# Patient Record
Sex: Male | Born: 1970 | Race: Black or African American | Hispanic: No | Marital: Single | Smoking: Current every day smoker
Health system: Southern US, Community
[De-identification: ages and names within clinical notes are randomized; demographics above are authoritative.]

## PROBLEM LIST (undated history)

## (undated) DIAGNOSIS — I471 Supraventricular tachycardia, unspecified: Secondary | ICD-10-CM

## (undated) DIAGNOSIS — F410 Panic disorder [episodic paroxysmal anxiety] without agoraphobia: Secondary | ICD-10-CM

## (undated) DIAGNOSIS — K5792 Diverticulitis of intestine, part unspecified, without perforation or abscess without bleeding: Secondary | ICD-10-CM

## (undated) DIAGNOSIS — M1990 Unspecified osteoarthritis, unspecified site: Secondary | ICD-10-CM

## (undated) DIAGNOSIS — I5022 Chronic systolic (congestive) heart failure: Secondary | ICD-10-CM

## (undated) DIAGNOSIS — K219 Gastro-esophageal reflux disease without esophagitis: Secondary | ICD-10-CM

## (undated) DIAGNOSIS — I499 Cardiac arrhythmia, unspecified: Secondary | ICD-10-CM

## (undated) DIAGNOSIS — R0600 Dyspnea, unspecified: Secondary | ICD-10-CM

## (undated) DIAGNOSIS — I10 Essential (primary) hypertension: Secondary | ICD-10-CM

## (undated) DIAGNOSIS — I2510 Atherosclerotic heart disease of native coronary artery without angina pectoris: Secondary | ICD-10-CM

## (undated) HISTORY — DX: Supraventricular tachycardia, unspecified: I47.10

## (undated) HISTORY — DX: Chronic systolic (congestive) heart failure: I50.22

## (undated) HISTORY — DX: Atherosclerotic heart disease of native coronary artery without angina pectoris: I25.10

## (undated) HISTORY — PX: NO PAST SURGERIES: SHX2092

## (undated) HISTORY — DX: Essential (primary) hypertension: I10

## (undated) HISTORY — DX: Diverticulitis of intestine, part unspecified, without perforation or abscess without bleeding: K57.92

## (undated) HISTORY — PX: COLONOSCOPY: SHX174

## (undated) HISTORY — DX: Supraventricular tachycardia: I47.1

---

## 2005-12-09 ENCOUNTER — Emergency Department (HOSPITAL_COMMUNITY): Admission: EM | Admit: 2005-12-09 | Discharge: 2005-12-09 | Payer: Self-pay | Admitting: Emergency Medicine

## 2013-01-08 ENCOUNTER — Encounter (HOSPITAL_COMMUNITY): Payer: Self-pay | Admitting: Emergency Medicine

## 2013-01-08 ENCOUNTER — Emergency Department (HOSPITAL_COMMUNITY)
Admission: EM | Admit: 2013-01-08 | Discharge: 2013-01-08 | Disposition: A | Discharge status: Home / Self Care | Payer: Self-pay | Attending: Emergency Medicine | Admitting: Emergency Medicine

## 2013-01-08 ENCOUNTER — Emergency Department (HOSPITAL_COMMUNITY): Payer: Self-pay

## 2013-01-08 DIAGNOSIS — Y9389 Activity, other specified: Secondary | ICD-10-CM | POA: Insufficient documentation

## 2013-01-08 DIAGNOSIS — F17200 Nicotine dependence, unspecified, uncomplicated: Secondary | ICD-10-CM | POA: Insufficient documentation

## 2013-01-08 DIAGNOSIS — Y929 Unspecified place or not applicable: Secondary | ICD-10-CM | POA: Insufficient documentation

## 2013-01-08 DIAGNOSIS — X500XXA Overexertion from strenuous movement or load, initial encounter: Secondary | ICD-10-CM | POA: Insufficient documentation

## 2013-01-08 DIAGNOSIS — S93409A Sprain of unspecified ligament of unspecified ankle, initial encounter: Secondary | ICD-10-CM | POA: Insufficient documentation

## 2013-01-08 DIAGNOSIS — I10 Essential (primary) hypertension: Secondary | ICD-10-CM | POA: Insufficient documentation

## 2013-01-08 MED ORDER — LISINOPRIL 20 MG PO TABS: 20.0000 mg | ORAL_TABLET | Freq: Every day | ORAL | Status: DC

## 2013-01-08 NOTE — ED Notes (Signed)
Pt's BP elevated in triage. Pt denies hx of high BP. Pt reports intermittent headaches.

## 2013-01-08 NOTE — ED Notes (Signed)
Pt c/o ankle pain after a fall earlier this afternoon.

## 2013-01-08 NOTE — ED Provider Notes (Addendum)
CSN: [id number]     Arrival date & time 01/08/13  1703 History  This chart was scribed for Benny Lennert, MD by Leone Payor, ED Scribe. This patient was seen in room APA05/APA05 and the patient's care was started 5:23 PM.    Chief Complaint  Patient presents with  . Foot Pain    Patient is a 42 y.o. male presenting with ankle pain. The history is provided by the patient. No language interpreter was used.  Ankle Pain Location:  Ankle Time since incident:  4 hours Injury: yes   Mechanism of injury comment:  Twist Ankle location:  L ankle Chronicity:  New Dislocation: no   Foreign body present:  No foreign bodies Relieved by:  Rest Worsened by:  Bearing weight Ineffective treatments:  None tried Associated symptoms: no back pain and no fatigue     HPI Comments: Anthony Vaughn is a 42 y.o. male who presents to the Emergency Department complaining of a left ankle injury that occurred about 4 hours ago. Pt states he was transporting something up a flight of stairs when he twisted his left ankle. He denies any other injuries. He denies numbness.    No past medical history on file. No past surgical history on file. No family history on file. History  Substance Use Topics  . Smoking status: Current Every Day Smoker  . Smokeless tobacco: Not on file  . Alcohol Use: No    Review of Systems  Constitutional: Negative for appetite change and fatigue.  HENT: Negative for congestion, ear discharge and sinus pressure.   Eyes: Negative for discharge.  Respiratory: Negative for cough.   Gastrointestinal: Negative for diarrhea.  Genitourinary: Negative for frequency and hematuria.  Musculoskeletal: Positive for arthralgias (left ankle pain). Negative for back pain.  Skin: Negative for rash.  Neurological: Negative for seizures and numbness.  Psychiatric/Behavioral: Negative for hallucinations.    Allergies  Review of patient's allergies indicates no known allergies.  Home  Medications  No current outpatient prescriptions on file. BP 219/133  Pulse 89  Temp(Src) 98.2 F (36.8 C) (Oral)  Resp 18  Ht 6' (1.829 m)  Wt 189 lb (85.73 kg)  BMI 25.63 kg/m2  SpO2 100% Physical Exam  Nursing note and vitals reviewed. Constitutional: He is oriented to person, place, and time. He appears well-developed.  HENT:  Head: Normocephalic.  Eyes: Conjunctivae are normal.  Neck: No tracheal deviation present.  Cardiovascular:  No murmur heard. Musculoskeletal: Normal range of motion. He exhibits tenderness (mild tenderness to bilateral left ankle. ).  Neurological: He is oriented to person, place, and time.  Skin: Skin is warm.  Psychiatric: He has a normal mood and affect.    ED Course  Procedures   DIAGNOSTIC STUDIES: Oxygen Saturation is 100% on RA, normal by my interpretation.    COORDINATION OF CARE: 5:29 PM Discussed treatment plan with pt at bedside and pt agreed to plan.   Labs Review Labs Reviewed - No data to display Imaging Review Dg Ankle Complete Left  01/08/2013   CLINICAL DATA:  Left ankle pain.  Twisting injury.  EXAM: LEFT ANKLE COMPLETE - 3+ VIEW  COMPARISON:  None.  FINDINGS: The ankle mortise is maintained. No acute ankle fracture. No osteochondral abnormality. The visualized mid and hindfoot bony structures are intact.  IMPRESSION: No acute bony findings.   Electronically Signed   By: Loralie Champagne M.D.   On: 01/08/2013 17:32    EKG Interpretation   None  MDM  No diagnosis found. The chart was scribed for me under my direct supervision.  I personally performed the history, physical, and medical decision making and all procedures in the evaluation of this patient.Benny Lennert, MD 01/08/13 1810  Benny Lennert, MD 01/08/13 [phone]

## 2018-09-18 ENCOUNTER — Ambulatory Visit (HOSPITAL_COMMUNITY)
Admission: RE | Admit: 2018-09-18 | Discharge: 2018-09-18 | Disposition: A | Discharge status: Home / Self Care | Payer: Self-pay | Source: Ambulatory Visit | Attending: Sports Medicine | Admitting: Sports Medicine

## 2018-09-18 ENCOUNTER — Other Ambulatory Visit (HOSPITAL_COMMUNITY): Payer: Self-pay | Admitting: Sports Medicine

## 2018-09-18 ENCOUNTER — Other Ambulatory Visit: Payer: Self-pay

## 2018-09-18 DIAGNOSIS — M7581 Other shoulder lesions, right shoulder: Secondary | ICD-10-CM | POA: Insufficient documentation

## 2018-10-02 ENCOUNTER — Encounter: Payer: Self-pay | Admitting: Internal Medicine

## 2018-10-20 NOTE — Progress Notes (Signed)
Referring Provider: Waldon Reining, MD Primary Care Physician:  Waldon Reining, MD Primary Gastroenterologist:  Dr. Jena Gauss  Chief Complaint  Patient presents with  . Abdominal Pain    all over, some nights pain is very bad, abd seems swollen at times  . Colonoscopy    3 yrs ago at [facility]  . Blood In Stools    last episode was a while ago, had dark black stool over the weekend  . Nausea    one day last week    HPI:   Anthony Vaughn is a 48 y.o. male presenting today at the request of Dr. Waldon Reining for history of diverticulitis, abdominal pain, and blood in the stool.  Today he states for the last 2-3 months he has had abdominal pain. Sometimes in the lower and other times in the upper abdomen. Pain in the lower abdomen typically comes on late at night.  States the pain will be present at some degree for most of the night.  Resolves once he gets up out of bed.  May come on later during the day but not as bad as it is at night.  Has gotten up to about 7/10 in severity and feels pain is similar to the pain he had with diverticulitis about 3 years ago.  No identified triggers.  Bowel movements 2-3 times a day.  Usually soft and formed.  When the pain started he changed his diet to soft foods as he was told to do this in the past with diverticulitis.  Has had intermittent rectal bleeding but none in the last 3 to 4 months. Sates blood will be in the toilet water and tissue. States bleeding is "a pretty good amount." No known hemorrhoids. Colonoscopy about 3 years ago at [facility] after episode of diverticulitis. Reports "3 spots they were going to watch." Thinks these were the diverticula. No polyps.   Pain in the upper abdomen would come and go throughout the day. No identified trigger.  Upper abdominal pain was not specifically worse after meals. Had been mild until Thursday when pain was severe. Severity was 10/10 for 18-20 hours.  Started drinking Mylanta Friday evening  which resolved upper and lower abdominal pain. Reports very dark/possibly black stools on Thursday or Friday. Had not taken pepto at that time. He is taking mylanta and pepto at times now. Stools are loose at this time. Rare acid reflux or heartburn. No dysphagia. Some nausea on Thursday but none since. No vomiting.   Takes goody powders, about 1 a day. Celebrex every other day. Took ibuprofen over the weekend.    Denies fever or chills. When the pain comes on, he has felt lightheaded. Reports his heart beating fast when his pain was severe on Thursday. No chest pain. No shortness of breath. No cough.   Alcohol once every few months.  History of Marijuana use. Last used about 6 months ago.   Past Medical History:  Diagnosis Date  . Diverticulitis   . HTN (hypertension)     History reviewed. No pertinent surgical history.  Current Outpatient Medications  Medication Sig Dispense Refill  . CELEBREX 100 MG capsule Take 1 capsule by mouth daily as needed.    Marland Kitchen lisinopril (PRINIVIL,ZESTRIL) 20 MG tablet Take 1 tablet (20 mg total) by mouth daily. 30 tablet 1  . pantoprazole (PROTONIX) 40 MG tablet Take 1 tablet (40 mg total) by mouth 2 (two) times daily. 60 tablet 5   No current facility-administered medications for this  visit.     Allergies as of 10/21/2018  . (Not on File)    Family History  Problem Relation Age of Onset  . Colon cancer Neg Hx   . Colon polyps Neg Hx     Social History   Socioeconomic History  . Marital status: Single    Spouse name: Not on file  . Number of children: Not on file  . Years of education: Not on file  . Highest education level: Not on file  Occupational History  . Not on file  Social Needs  . Financial resource strain: Not on file  . Food insecurity    Worry: Not on file    Inability: Not on file  . Transportation needs    Medical: Not on file    Non-medical: Not on file  Tobacco Use  . Smoking status: Current Every Day Smoker  .  Smokeless tobacco: Never Used  Substance and Sexual Activity  . Alcohol use: No  . Drug use: No  . Sexual activity: Not on file  Lifestyle  . Physical activity    Days per week: Not on file    Minutes per session: Not on file  . Stress: Not on file  Relationships  . Social Herbalist on phone: Not on file    Gets together: Not on file    Attends religious service: Not on file    Active member of club or organization: Not on file    Attends meetings of clubs or organizations: Not on file    Relationship status: Not on file  . Intimate partner violence    Fear of current or ex partner: Not on file    Emotionally abused: Not on file    Physically abused: Not on file    Forced sexual activity: Not on file  Other Topics Concern  . Not on file  Social History Narrative  . Not on file    Review of Systems: Gen:See HPI   CV: See HPI.  Resp: See HPI GI:  See HPI GU : Denies urinary burning, urinary frequency, urinary hesitancy MS: Chronic shoulder pain.  Derm: Denies rash Psych: Denies depression, anxiety Heme: See HPI  Physical Exam: BP (!) 151/97   Pulse 80   Temp 97.6 F (36.4 C) (Oral)   Ht 6' (1.829 m)   Wt 167 lb 9.6 oz (76 kg)   BMI 22.73 kg/m  General:   Alert and oriented. Pleasant and cooperative. Well-nourished and well-developed.  Head:  Normocephalic and atraumatic. Eyes:  Without icterus, sclera clear and conjunctiva pink.  Ears:  Normal auditory acuity. Nose:  No deformity, discharge,  or lesions. Lungs:  Clear to auscultation bilaterally. No wheezes, rales, or rhonchi. No distress.  Heart:  S1, S2 present without murmurs appreciated.  Abdomen:  +BS, soft, non-distended. Mild tenderness to deep palpation in the epigastric and RUQ region. No tenderness to palpation in the lower abdomen.  No HSM noted. No guarding or rebound. No masses appreciated.  Rectal:  Deferred  Msk:  Symmetrical without gross deformities. Normal posture. Extremities:   Without edema. Neurologic:  Alert and  oriented x4;  grossly normal neurologically. Skin:  Intact without significant lesions or rashes. Psych:  Normal mood and affect.

## 2018-10-21 ENCOUNTER — Ambulatory Visit (INDEPENDENT_AMBULATORY_CARE_PROVIDER_SITE_OTHER): Payer: Self-pay | Admitting: Gastroenterology

## 2018-10-21 ENCOUNTER — Other Ambulatory Visit: Payer: Self-pay

## 2018-10-21 ENCOUNTER — Encounter: Payer: Self-pay | Admitting: Gastroenterology

## 2018-10-21 VITALS — BP 151/97 | HR 80 | Temp 97.6°F | Ht 72.0 in | Wt 167.6 lb

## 2018-10-21 DIAGNOSIS — K921 Melena: Secondary | ICD-10-CM

## 2018-10-21 DIAGNOSIS — R1013 Epigastric pain: Secondary | ICD-10-CM | POA: Insufficient documentation

## 2018-10-21 DIAGNOSIS — R109 Unspecified abdominal pain: Secondary | ICD-10-CM | POA: Insufficient documentation

## 2018-10-21 DIAGNOSIS — R1030 Lower abdominal pain, unspecified: Secondary | ICD-10-CM

## 2018-10-21 DIAGNOSIS — R1010 Upper abdominal pain, unspecified: Secondary | ICD-10-CM | POA: Insufficient documentation

## 2018-10-21 MED ORDER — PANTOPRAZOLE SODIUM 40 MG PO TBEC: 40.0000 mg | DELAYED_RELEASE_TABLET | Freq: Two times a day (BID) | ORAL | 5 refills | Status: DC

## 2018-10-21 NOTE — Patient Instructions (Addendum)
Complete labs and IFOBT. Further recommendations to follow.   Please start taking Protonix 40 mg 30 minutes before breakfast and 30 minutes before dinner.   If you develop lightheadedness, dizziness, or feel like you will pass out please go to the ED.   Aliene Altes, PA-C [facility]

## 2018-10-22 ENCOUNTER — Ambulatory Visit (INDEPENDENT_AMBULATORY_CARE_PROVIDER_SITE_OTHER): Payer: Self-pay | Admitting: Nurse Practitioner

## 2018-10-22 ENCOUNTER — Other Ambulatory Visit (HOSPITAL_COMMUNITY)
Admission: RE | Admit: 2018-10-22 | Discharge: 2018-10-22 | Disposition: A | Discharge status: Home / Self Care | Payer: Self-pay | Source: Ambulatory Visit | Attending: Gastroenterology | Admitting: Gastroenterology

## 2018-10-22 DIAGNOSIS — K921 Melena: Secondary | ICD-10-CM

## 2018-10-22 LAB — COMPREHENSIVE METABOLIC PANEL
ALT: 17 U/L (ref 0–44)
AST: 15 U/L (ref 15–41)
Albumin: 3.5 g/dL (ref 3.5–5.0)
Alkaline Phosphatase: 55 U/L (ref 38–126)
Anion gap: 8 (ref 5–15)
BUN: 15 mg/dL (ref 6–20)
CO2: 24 mmol/L (ref 22–32)
Calcium: 9.8 mg/dL (ref 8.9–10.3)
Chloride: 106 mmol/L (ref 98–111)
Creatinine, Ser: 0.89 mg/dL (ref 0.61–1.24)
GFR calc Af Amer: >60 mL/min (ref >60)
GFR calc non Af Amer: >60 mL/min (ref >60)
Glucose, Bld: 93 mg/dL (ref 70–99)
Potassium: 3.8 mmol/L (ref 3.5–5.1)
Sodium: 138 mmol/L (ref 135–145)
Total Bilirubin: 0.3 mg/dL (ref 0.3–1.2)
Total Protein: 6.8 g/dL (ref 6.5–8.1)

## 2018-10-22 LAB — IFOBT (OCCULT BLOOD): IFOBT: NEGATIVE

## 2018-10-22 LAB — CBC WITH DIFFERENTIAL/PLATELET
Abs Immature Granulocytes: 0.01 10*3/uL (ref 0.00–0.07)
Basophils Absolute: 0 10*3/uL (ref 0.0–0.1)
Basophils Relative: 1 %
Eosinophils Absolute: 0.1 10*3/uL (ref 0.0–0.5)
Eosinophils Relative: 3 %
HCT: 41.5 % (ref 39.0–52.0)
Hemoglobin: 13.2 g/dL (ref 13.0–17.0)
Immature Granulocytes: 0 %
Lymphocytes Relative: 52 %
Lymphs Abs: 2.2 10*3/uL (ref 0.7–4.0)
MCH: 30.3 pg (ref 26.0–34.0)
MCHC: 31.8 g/dL (ref 30.0–36.0)
MCV: 95.2 fL (ref 80.0–100.0)
Monocytes Absolute: 0.4 10*3/uL (ref 0.1–1.0)
Monocytes Relative: 9 %
Neutro Abs: 1.4 10*3/uL — ABNORMAL LOW (ref 1.7–7.7)
Neutrophils Relative %: 35 %
Platelets: 272 10*3/uL (ref 150–400)
RBC: 4.36 MIL/uL (ref 4.22–5.81)
RDW: 12.8 % (ref 11.5–15.5)
WBC: 4.1 10*3/uL (ref 4.0–10.5)
nRBC: 0 % (ref 0.0–0.2)

## 2018-10-26 NOTE — Assessment & Plan Note (Addendum)
48 year old male with intermittent epigastric pain x2-3 months without any identified triggers. Not specifically postprandial. Pain had been mild until 10/17/2018 when severity increased to 10/10.  This was constant for 18-20 hours until he started taking Mylanta.  Mylanta resolved abdominal pain completely.  Associated nausea on 10/17/18 without vomiting.  This has resolved.  Reports very dark/?black stools on 10/17/18 or 10/18/18.  He has also started taking Pepto but states he did not take this prior to the dark stools. No fever/chills.  Rare reflux/heartburn. No dysphagia.  Admits to 1 Goody powders a day, Celebrex every other day, and ibuprofen over the weekend.  Patient is without abdominal pain In the office today as he took Mylanta this morning.  Abdominal exam with mild tenderness to deep palpation in the epigastric/RUQ.   With severity of epigastric pain, history of NSAID use, and significant improvement with Mylanta I am concerned about PUD versus gastritis, duodenitis, esophagitis. Less likely biliary/gallbladder etiology as symptoms are not postprandial.   Start Protonix 40 mg twice daily 30 minutes before breakfast and dinner. Stop all NSAIDs. IFOBT, CBC, CMP today. Stop pepto and monitor for any black stools. If IFOBT is positive, CBC with signs of anemia, black stools continue patient will need EGD ASAP. If these are negative, then will likely schedule EGD with TCS as patient also reported intermittent rectal bleeding with none in the last 3-4 months as described below.  Will determine follow-up after labs result.   Addendum: Labs on 10/22/18 with hemoglobin 13.2 and IFOBT negative.  9/24 patient reported no further melena and improving epigastric pain.  Admitted to 1 episode of rectal bleeding that morning.  On 9/25 patient reported no further rectal bleeding or black stools.  Denied lower abdominal pain.  Continued with occasional epigastric pain but improving. Proceed with EGD with Dr.  Gala Romney in the near future for further evaluation. The risks, benefits, and alternatives have been discussed in detail with patient. They have stated understanding and desire to proceed.

## 2018-10-26 NOTE — Assessment & Plan Note (Addendum)
Patient reported lower abdominal pain x 2-3 months. Pain starts late at night and remains present to some degree until he gets up in the morning. Max severity up to 7/10. No identified triggers. BMs daily. No recent rectal bleeding. Reports intermittent brbpr in the past but none in the last 3-4 months. Blood has been in the toilet water and on toilet tissue. History of diverticulitis about 3 years ago. Felt that pain was similar to his past episode of diverticulitis; however, since starting mylanta for epigastric pain as described above, patients lower abdominal pain has resolved. No fever/chills. No abdominal pain during today's office visit. No tenderness to palpation of the lower abdomen on exam. Last TCS about 3 years ago at [facility] following diverticulitis. Patient states there were "3 spots they were going to watch." Thinks these were the diverticula. No polyps. No family history of colon cancer or colon polyps.   Query whether lower abdominal pain was actually referred pain from his epigastric region where I am concerned about PUD vs gastritis, esophagitis, or duodenitis vs recurrence of diverticulitis. As patient is without any abdominal pain today and no tenderness to palpation in the lower abdomen, I am less suspicious for diverticulitis and more concerned about an upper GI issue.  Will start by getting labs and starting patient on Protonix 40 mg BID as described above.  If lower abdominal pain returns, will need to obtain CT abdomen and pelvis to rule out diverticulitis.  Patient is going to need TCS for rectal bleeding, but he may need UGI evaluation more urgently. Rectal bleeding may be secondary to diverticular bleed vs hemorrhoids vs polyps vs malignancy.  Request TCS records.  Further recommendation to come after labs.   Addendum: Labs on 10/22/18 with hemoglobin 13.2 and IFOBT negative. On 9/24, patient reported one episode of rectal bleeding (about a teaspoon of blood in the toilet  water). None since. No lower abdominal pain. Epigastric pain improving. No further melena. Proceed with TCS for further evaluation of rectal bleeding with Dr. Gala Romney in the near future. The risks, benefits, and alternatives have been discussed in detail with patient. They have stated understanding and desire to proceed.  Patient advised to call if rectal bleeding worsens. ED return precautions given.

## 2018-10-28 ENCOUNTER — Telehealth: Payer: Self-pay | Admitting: Gastroenterology

## 2018-10-28 NOTE — Telephone Encounter (Signed)
Anthony Vaughn, I tried to call patient to follow-up with him but there was no answer.  Can we please try to call him again tomorrow and ask him how he is doing?  Has he had any more black stools?  Has he had any recurrence of epigastric pain?  Has he had any recurrence of lower abdominal pain?

## 2018-10-29 NOTE — Telephone Encounter (Signed)
Lmom, waiting on a return call.  

## 2018-10-29 NOTE — Progress Notes (Signed)
cc'ed to pcp °

## 2018-10-30 ENCOUNTER — Ambulatory Visit: Payer: Self-pay | Admitting: Gastroenterology

## 2018-10-31 NOTE — Telephone Encounter (Signed)
Spoke with pt. He feels the medication has helped him a lot. Pt had a bowel movement this morning and it had about a teaspoon of blood in the toilet. Stool wasn't black. Pt hadn't been seeing the blood. Pt also says the abdominal pain isn't as severe as it was. The pain comes every once in a while but doesn't last long.

## 2018-10-31 NOTE — Telephone Encounter (Signed)
Tried calling pt. No VM available. 

## 2018-11-01 NOTE — Telephone Encounter (Signed)
Spoke with patient. He has not had any further rectal bleeding since yesterday and no other black stools. Upper abdominal pain continues at times. Discussed scheduling him for EGD and TCS with Dr. Gala Romney in the near future for further evaluation of his epigastric pain, recent melena, and rectal bleeding. The risks, benefits, and alternatives have been discussed in detail with patient.  He stated his understanding and desire to proceed.   He was also advised to call our office if his lower abdominal pain returns as he would need further evaluation with CT for possible diverticulitis.  He is also to call us if his rectal bleeding worsens.  ED return precautions provided.  Cardiac clinical pool, please schedule patient for EGD and TCS with Dr. Gala Romney in the near future.

## 2018-11-04 ENCOUNTER — Other Ambulatory Visit: Payer: Self-pay

## 2018-11-04 DIAGNOSIS — R1013 Epigastric pain: Secondary | ICD-10-CM

## 2018-11-04 DIAGNOSIS — K921 Melena: Secondary | ICD-10-CM

## 2018-11-04 MED ORDER — PEG 3350-KCL-NA BICARB-NACL 420 G PO SOLR: 4000.0000 mL | ORAL | 0 refills | Status: DC

## 2018-11-04 NOTE — Telephone Encounter (Signed)
Tried to call pt, no answer, no answering machine. 

## 2018-11-04 NOTE — Telephone Encounter (Signed)
Pt called office, TCS/EGD w/RMR scheduled for 12/17/18 at 8:15am. COVID test 12/13/18 at 9:15am. Pt aware he will need to quarantine at home after the test until procedure. Rx for prep sent to pharmacy. Orders entered. Appt letter mailed with procedure instructions.  Pt requested for Protonix rx be sent to [place]. Protonix rx was sent to [facility] 10/21/18. Called pt back and informed him. Advised him to call [facility] to check on Protonix.

## 2018-12-12 ENCOUNTER — Other Ambulatory Visit: Payer: Self-pay

## 2018-12-13 ENCOUNTER — Other Ambulatory Visit: Payer: Self-pay

## 2018-12-13 ENCOUNTER — Other Ambulatory Visit (HOSPITAL_COMMUNITY)
Admission: RE | Admit: 2018-12-13 | Discharge: 2018-12-13 | Disposition: A | Discharge status: Home / Self Care | Payer: Self-pay | Source: Ambulatory Visit | Attending: Internal Medicine | Admitting: Internal Medicine

## 2018-12-16 ENCOUNTER — Telehealth: Payer: Self-pay | Admitting: Internal Medicine

## 2018-12-16 ENCOUNTER — Telehealth: Payer: Self-pay | Admitting: *Deleted

## 2018-12-16 NOTE — Telephone Encounter (Signed)
Patient called back. He states he needs to r/s procedure and requests for it to be after 1st of year. He is now on for 1/26 at 7:30am. Patient aware will mail new instructions/covid appt. Confirmed address. Called endo and LMOVM.

## 2018-12-16 NOTE — Telephone Encounter (Signed)
See prior note

## 2018-12-16 NOTE — Telephone Encounter (Signed)
Returning call.[phone]

## 2018-12-16 NOTE — Telephone Encounter (Signed)
Endo called patient did not show up for COVID testing. Procedure scheduled for tomorrow  Called pt, line rang numerous times, no answer and no VM

## 2019-01-13 ENCOUNTER — Telehealth: Payer: Self-pay | Admitting: Internal Medicine

## 2019-01-13 NOTE — Telephone Encounter (Signed)
Spoke with pt. Pt went to the ED [facility] in November with a diverticulitis flare. Pt was given some antibiotics and a scan was done on him. Pt isn't sure of the antibiotic names that were given to him. Pt is having a little spot near his ribs that are sore to touch. Pt says the pain isn't severe but it's sore to the touch. Pt reports no nausea, no vomiting and pt feels a lot better after taking his antibioitic. Pt wasn't sure if it was his gallbladder or what the scan showed. pt is eating soft foods and feels when he eats greasy foods his stomach flares. Anthony Vaughn, would you like records requested from [facility]?

## 2019-01-13 NOTE — Telephone Encounter (Signed)
Spoke with pt. Pt is having the tenderness on the rt side near his ribs. Pt says when he gets the diverticulitis, he has the pain more at his lower abdomen. Pt is taking Pantoprazole twice daily.    Manuela Schwartz, please request records from [facility].

## 2019-01-13 NOTE — Telephone Encounter (Signed)
[phone]  OR [phone] PLEASE CALL PATIENT, HAVING ABDOMINAL PAIN UNDER HIS RIBS

## 2019-01-13 NOTE — Telephone Encounter (Signed)
Yes, please request records. Is his pain on the right or the left side near his ribs? Is this the same area he had pain with diverticulitis? Is he still taking Protonix 40 mg BID?  Once I have records to review, I will have further recommendations. If he has any worsening of his abdominal pain, he should call us or present to the emergency room if pain is severe.

## 2019-01-14 NOTE — Telephone Encounter (Signed)
Requested records from [facility] °

## 2019-01-15 NOTE — Telephone Encounter (Signed)
Called pt, no VM available. Will call pt back.

## 2019-01-15 NOTE — Telephone Encounter (Signed)
Received and reviewed records from [facility].  Patient presented to the ED on 12/31/2018.    CT abdomen and pelvis with contrast impression with left-sided colitis.  There are numerous diverticula at the affected: But the extent of inflammation is greater than usually seen with diverticulitis.  No perforation or abscess.  Reviewed additional findings which did not indicate the presence of gallstones. Labs: CBC: WBC 8.0, hemoglobin 15.5, MCV 92.7, MCH 30.5, MCHC 32.9, platelet count 287 CMP: Glucose 103, BUN 29 (H), creatinine 1.16, calcium 10.5 (H), alk phos 65, AST 20.7, ALT 15, total bilirubin 0.4, sodium 139, potassium 4.9, chloride 102 Lipase 16 (L).  Patient was treated with cipro 500 BID and flagyl 500 TID x 7 days for colitis/diverticulitis.   Anthony Vaughn, please let patient know that we have received and reviewed his records.  His CT scan did not specifically demonstrate gallstones.  They treated him with ciprofloxacin and metronidazole x 7 days for colitis/diverticulitis.  At his last office visit, he had epigastric/right upper quadrant pain which is why he was started on Protonix.  Is he taking ibuprofen, Aleve, Advil, Goody powders? He should not be taking any NSAIDs. How is he today? Does he had RUQ pain after eating or just with palpation?  Regarding his diverticulitis, he will need to let us know if he has any recurrence of abdominal pain prior to his scheduled procedures because risk of colonic perforation is much higher if his colon is actively inflamed.

## 2019-01-17 NOTE — Telephone Encounter (Signed)
Called pt. NO VM

## 2019-01-20 NOTE — Telephone Encounter (Signed)
Noted. Will have further recommendations once I receive records. We will be in touch with him soon. If he has any worsening of pain or his pain doesn't resolve with his antibiotics, he should call us. He may need different antibiotics as he was treated with cipro and flagyl in November as well.

## 2019-01-20 NOTE — Telephone Encounter (Signed)
Routing to [place]. See last note.

## 2019-01-20 NOTE — Telephone Encounter (Signed)
Pt returned call. I tried reaching pt last week and wasn't able to get him. Pt went back to the ED Saturday around 11PM 01/18/2019. Pt was having another diverticulitis flare. A CT scan was done and possibly another scan. I will ask susan to request records. Pt was placed on a liquid diet until this Wednesday 01/22/2019, given Zofran, Cipro 500 mg bid x 14 days, metronidazole 500 mg bid x 7 days and oxycodone/acte 325 mg. Pt isn't having as much pain right now. Pt is resting st home. Pt's pain started on the rt side abd and went to the left abd and up to his chest. When pt could feel pain in the chest area, he went to the ED on 01/18/19. Pt will call back if he has more pain.   Pt stated that he takes Ibuprofen seldom and his doctor put him on Celebrex as needed. Pt says he doesn't have any RUQ pain after eating and says it will just come on. Pt isn't currently feeling pain, nausea, vomiting.

## 2019-01-20 NOTE — Telephone Encounter (Signed)
Tried getting pt. His pone continues to ring. No VM. Will call pt back.

## 2019-01-22 NOTE — Telephone Encounter (Signed)
Tried calling pt again with no answer. Letter mailed to pt.

## 2019-01-26 NOTE — Telephone Encounter (Signed)
Anthony Vaughn see prior note. Not sure if you were asked to request records. If not, could you request most recent ED notes/labs/imaging from [facility] (01/18/19)?

## 2019-01-28 ENCOUNTER — Encounter: Payer: Self-pay | Admitting: Internal Medicine

## 2019-01-28 NOTE — Telephone Encounter (Signed)
Noted  

## 2019-01-28 NOTE — Telephone Encounter (Signed)
I've requested records from [facility]

## 2019-01-28 NOTE — Telephone Encounter (Signed)
CALLED PATIENT, NO VOICE MAIL, APPOINTMENT MADE AND LETTER SENT

## 2019-02-03 NOTE — Telephone Encounter (Signed)
Spoke with patient. He is able to come in tomorrow at 2 om with [place]. Elmo Putt, can you place him on Eric's schedule for tomorrow?

## 2019-02-03 NOTE — Telephone Encounter (Signed)
Pt called with c/o abdominal pain that comes and goes. Pt states he will have pain about 3 weeks out of the month, when he goes to the ED, it takes 1 week for the antibiotics to work for pt. Pt states his pain was originally on his rt side, then he's had pain in his lower abdomen and this week the pain in on the lt side of his abdomen. Pt knows he has an apt on 02/12/2018 and isn't sure if he can go that long. Currently the pain isn't present and comes and goes. Pt does have nausea that is staying. Pt isn't currently on any antibiotics.

## 2019-02-04 ENCOUNTER — Ambulatory Visit (INDEPENDENT_AMBULATORY_CARE_PROVIDER_SITE_OTHER): Payer: Self-pay | Admitting: Nurse Practitioner

## 2019-02-04 ENCOUNTER — Encounter: Payer: Self-pay | Admitting: Nurse Practitioner

## 2019-02-04 ENCOUNTER — Other Ambulatory Visit: Payer: Self-pay

## 2019-02-04 VITALS — BP 160/112 | HR 98 | Temp 96.8°F | Ht 72.0 in | Wt 160.0 lb

## 2019-02-04 DIAGNOSIS — R112 Nausea with vomiting, unspecified: Secondary | ICD-10-CM | POA: Insufficient documentation

## 2019-02-04 DIAGNOSIS — R110 Nausea: Secondary | ICD-10-CM | POA: Insufficient documentation

## 2019-02-04 DIAGNOSIS — R1030 Lower abdominal pain, unspecified: Secondary | ICD-10-CM

## 2019-02-04 DIAGNOSIS — Z8719 Personal history of other diseases of the digestive system: Secondary | ICD-10-CM | POA: Insufficient documentation

## 2019-02-04 NOTE — Telephone Encounter (Signed)
Pt was placed in apt per [facility] on 02/03/2019.

## 2019-02-04 NOTE — Patient Instructions (Signed)
Your health issues we discussed today were:   Abdominal pain and nausea with a history of diverticulitis: 1. We have requested your ER records, CT scan results, labs, etc. from [facility] 2. When I am able to review these I will call you back and make further recommendations 3. Call us if you do not hear back in 1 to 2 days 4. Call us if you have any worsening or severe symptoms  Elevated blood pressure: 1. Call your primary care as soon as you leave our office and tell them that your blood pressure was 160/114 today 2. If you develop any symptoms of high blood pressure such as chest pain, skipped beats in your heart, severe headache, vision changes, dizziness, lightheadedness and proceed directly to the emergency room.  You may need to call an ambulance to do so  Overall I recommend:  1. Continue your other current medications 2. Return for follow-up in a timeframe to be determined when you get your results 3. Call us if you have any questions or concerns.   Because of recent events of COVID-19 ("Coronavirus"), follow CDC recommendations:  Wash your hand frequently Avoid touching your face Stay away from people who are sick If you have symptoms such as fever, cough, shortness of breath then call your healthcare provider for further guidance If you are sick, STAY AT HOME unless otherwise directed by your healthcare provider. Follow directions from state and national officials regarding staying safe   At [facility] we value your feedback. You may receive a survey about your visit today. Please share your experience as we strive to create trusting relationships with our patients to provide genuine, compassionate, quality care.  We appreciate your understanding and patience as we review any laboratory studies, imaging, and other diagnostic tests that are ordered as we care for you. Our office policy is 5 business days for review of these results, and any emergent or  urgent results are addressed in a timely manner for your best interest. If you do not hear from our office in 1 week, please contact us.   We also encourage the use of MyChart, which contains your medical information for your review as well. If you are not enrolled in this feature, an access code is on this after visit summary for your convenience. Thank you for allowing [place] to be involved in your care.  It was great to see you today!  I hope you have a Happy New year!!

## 2019-02-04 NOTE — Progress Notes (Signed)
Referring Provider: Waldon Reining, MD Primary Care Physician:  Waldon Reining, MD Primary GI:  Dr. Jena Gauss  Chief Complaint  Patient presents with  . Abdominal Pain  . Nausea  . Diarrhea    HPI:   Anthony Vaughn is a 48 y.o. male who presents for abdominal pain and wonders if he's having a diverticulitis flare. The patient was last seen in our office 10/21/2018 for melena, lower abdominal pain.  At that time noted epigastric pain for 2 to 3 months without triggers and not specifically postprandial which was mild initially and then worsened a few days prior to visit.  His pain became constant for about 20 hours until he started taking Mylanta which resolved his pain completely.  Associated nausea which is also resolved.  Noted black stools on 10/17/2018 for about 1 day, although he states he did start taking Pepto.  Rare reflux.  Goody powders generally once a day and Celebrex every other day with ibuprofen over the weekend.  Abdominal exam found mild tenderness to deep palpation of the epigastric/right upper quadrant.  Concerned about peptic ulcer disease versus gastritis, duodenitis, esophagitis.  Recommended Protonix 40 mg twice daily, stop all NSAIDs, iFOBT, CBC, CMP.  Stop Pepto monitor for black stools.  If positive and any signs of anemia patient will need an EGD ASAP.  If negative then likely schedule EGD with colonoscopy due to also noted intermittent rectal bleeding.  Labs completed 10/22/2018 with CBC essentially normal, CMP normal, FOBT negative.  Follow-up with patient found PPI helped a lot, noted teaspoon of blood in the toilet that was not black.  Abdominal pain not as severe but is still intermittently persistent.  Recommended endoscopy and colonoscopy, call for any worsening symptoms for CT to rule out possible diverticulitis.  He was initially scheduled for November for his procedures, although he was a no-show to his Covid test.  He called back asking to reschedule.  He  is now scheduled for 03/04/2019.  He called our office yesterday indicating intermittent abdominal pain about 3 weeks out of the month when he typically goes to the ED and was given antibiotics for presumptive diverticulitis.  Pain rating on his right side and and lower abdomen which eventually migrated to his left lower quadrant.  Pain is intermittent, does have nausea that is persistent/constant.  Not on any antibiotics.  No abdominal imaging found in our system.  Today he states he's doing ok overall. States he was at [facility] twice since Thanksgiving; states they did a CT scan, most recently with oral and IV contrast. He was told it showed diverticulitis "and maybe something else too?" He was going to be hept inpatient but they were overwhelmed and would have been in the ER for a long time so they discharged him home on antibiotics (two of them; ? Cipro and Flagyl ?). He is still having lower abdominal pain intermittently and typically starts at an 8/10 for a couple minutes then eases up. Pain is cramping. If pain is bad, will have chest discomfort. (Of note, ED wanted him to see a cardiologist). Scared to eat. Also some mild nausea, but no vomiting. Typically has daily to bid loose stools. Hasn't had a bowel movement in a couple days. Has had dark stools but has been taking Pepto Bismol. Denies hematochezia, fever, chills, unintentional weight loss. Denies URI or flu-like symptoms. Denies loss of sense of taste or smell. Denies chest pain, dyspnea, dizziness, lightheadedness, syncope, near syncope. Denies any other upper or  lower GI symptoms.  Past Medical History:  Diagnosis Date  . Diverticulitis   . HTN (hypertension)     Past Surgical History:  Procedure Laterality Date  . NO PAST SURGERIES      Current Outpatient Medications  Medication Sig Dispense Refill  . CELEBREX 100 MG capsule Take 1 capsule by mouth daily as needed.    Marland Kitchen lisinopril (PRINIVIL,ZESTRIL) 20 MG tablet Take 1 tablet (20  mg total) by mouth daily. 30 tablet 1  . pantoprazole (PROTONIX) 40 MG tablet Take 1 tablet (40 mg total) by mouth 2 (two) times daily. 60 tablet 5   No current facility-administered medications for this visit.    Allergies as of 02/04/2019 - Review Complete 02/04/2019  Allergen Reaction Noted  . Other  02/04/2019    Family History  Problem Relation Age of Onset  . Colon cancer Neg Hx   . Colon polyps Neg Hx     Social History   Socioeconomic History  . Marital status: Single    Spouse name: Not on file  . Number of children: Not on file  . Years of education: Not on file  . Highest education level: Not on file  Occupational History  . Not on file  Tobacco Use  . Smoking status: Current Every Day Smoker    Packs/day: 0.25    Types: Cigarettes  . Smokeless tobacco: Never Used  Substance and Sexual Activity  . Alcohol use: No  . Drug use: No  . Sexual activity: Not on file  Other Topics Concern  . Not on file  Social History Narrative  . Not on file   Social Determinants of Health   Financial Resource Strain:   . Difficulty of Paying Living Expenses: Not on file  Food Insecurity:   . Worried About Programme researcher, broadcasting/film/video in the Last Year: Not on file  . Ran Out of Food in the Last Year: Not on file  Transportation Needs:   . Lack of Transportation (Medical): Not on file  . Lack of Transportation (Non-Medical): Not on file  Physical Activity:   . Days of Exercise per Week: Not on file  . Minutes of Exercise per Session: Not on file  Stress:   . Feeling of Stress : Not on file  Social Connections:   . Frequency of Communication with Friends and Family: Not on file  . Frequency of Social Gatherings with Friends and Family: Not on file  . Attends Religious Services: Not on file  . Active Member of Clubs or Organizations: Not on file  . Attends Banker Meetings: Not on file  . Marital Status: Not on file    Review of Systems: General: Negative for  anorexia, weight loss, fever, chills, fatigue, weakness. ENT: Negative for hoarseness, difficulty swallowing. CV: Negative for chest pain, angina, palpitations, peripheral edema.  Respiratory: Negative for dyspnea at rest, cough, sputum, wheezing.  GI: See history of present illness. Endo: Negative for unusual weight change.  Heme: Negative for bruising or bleeding. Allergy: Negative for rash or hives.   Physical Exam: BP (!) 159/114   Pulse 98   Temp (!) 96.8 F (36 C) (Temporal)   Ht 6' (1.829 m)   Wt 160 lb (72.6 kg)   BMI 21.70 kg/m  General:   Alert and oriented. Pleasant and cooperative. Well-nourished and well-developed.  Eyes:  Without icterus, sclera clear and conjunctiva pink.  Ears:  Normal auditory acuity. Cardiovascular:  S1, S2 present without murmurs appreciated.  Extremities without clubbing or edema. Respiratory:  Clear to auscultation bilaterally. No wheezes, rales, or rhonchi. No distress.  Gastrointestinal:  +BS, soft, and non-distended. Mild lower abdominal TTP noted. No HSM noted. No guarding or rebound. No masses appreciated.  Rectal:  Deferred  Musculoskalatal:  Symmetrical without gross deformities. Skin:  Intact without significant lesions or rashes. Neurologic:  Alert and oriented x4;  grossly normal neurologically. Psych:  Alert and cooperative. Normal mood and affect. Heme/Lymph/Immune: No excessive bruising noted.    02/04/2019 2:22 PM   Disclaimer: This note was dictated with voice recognition software. Similar sounding words can inadvertently be transcribed and may not be corrected upon review.

## 2019-02-05 ENCOUNTER — Telehealth: Payer: Self-pay | Admitting: Nurse Practitioner

## 2019-02-05 DIAGNOSIS — K5732 Diverticulitis of large intestine without perforation or abscess without bleeding: Secondary | ICD-10-CM

## 2019-02-05 MED ORDER — AMOXICILLIN-POT CLAVULANATE ER 1000-62.5 MG PO TB12: 2.0000 | ORAL_TABLET | Freq: Two times a day (BID) | ORAL | 0 refills | Status: DC

## 2019-02-05 NOTE — Telephone Encounter (Signed)
Called Patient and discussed recent CT at [facility]. Due to recurrent pain and recent diverticulitis x 2, I will send him in Augmentin XR 2g bid x 10 days. He is aware to call us with an update in 24-48 hours. He is to call us immediately or go to the ER for worsening/severe abdominal pain, N/V, inability to tolerate food/fluids, fever, chills, etc.  Will contact local surgical practice to make them aware of persistent diverticulitis x 2 in the past month (and possible persistent still, given patient symptoms) should surgical intervention be necessary.  FYI AM to check on patient Monday if we do not hear from him by then.

## 2019-02-05 NOTE — Telephone Encounter (Signed)
Noted  

## 2019-02-06 ENCOUNTER — Telehealth: Payer: Self-pay | Admitting: Internal Medicine

## 2019-02-06 DIAGNOSIS — K5792 Diverticulitis of intestine, part unspecified, without perforation or abscess without bleeding: Secondary | ICD-10-CM

## 2019-02-06 MED ORDER — AMOXICILLIN-POT CLAVULANATE 875-125 MG PO TABS: 1.0000 | ORAL_TABLET | Freq: Three times a day (TID) | ORAL | 0 refills | Status: AC

## 2019-02-06 NOTE — Addendum Note (Signed)
Addended by: Gordy Levan, Aidynn Polendo A on: 02/06/2019 03:47 PM   Modules accepted: Orders

## 2019-02-06 NOTE — Telephone Encounter (Signed)
Please call in that medication to the [facility]. Pt has goodrx on his phone and can pay the $25.00.

## 2019-02-06 NOTE — Telephone Encounter (Signed)
I can try and regular Augmentin 875 mg q 8 hours (three times a day) and see if this is any cheaper.  Let me know if he would like to try this.

## 2019-02-06 NOTE — Telephone Encounter (Signed)
Rx sent to pharmacy per patient request. 

## 2019-02-06 NOTE — Telephone Encounter (Signed)
Pt called asking for EG or the nurse. I told him that both were with other patients. Patient did not want to leave message with me. He asked to be transferred to VM.

## 2019-02-06 NOTE — Telephone Encounter (Signed)
Augmentin XR is over $150 for pt. Pt uses [facility] as well but it will take 1 week for medication to arrive. Please advise. I've checked GoodRx and medication will be around $100 or more for #40 caps.

## 2019-02-07 DIAGNOSIS — I219 Acute myocardial infarction, unspecified: Secondary | ICD-10-CM

## 2019-02-07 HISTORY — DX: Acute myocardial infarction, unspecified: I21.9

## 2019-02-11 ENCOUNTER — Telehealth: Payer: Self-pay | Admitting: Internal Medicine

## 2019-02-11 MED ORDER — PEG 3350-KCL-NA BICARB-NACL 420 G PO SOLR: 4000.0000 mL | ORAL | 0 refills | Status: DC

## 2019-02-11 NOTE — Addendum Note (Signed)
Addended by: Corrie Mckusick on: 02/11/2019 10:28 AM   Modules accepted: Orders

## 2019-02-11 NOTE — Telephone Encounter (Signed)
Patient called and said he needs his prep kit sent to [facility] in [place]

## 2019-02-11 NOTE — Telephone Encounter (Signed)
Noted, I'm glad he's feeling better on Amoxacillin. Please check up with him again this coming Monday to ensure he's still doing well. I wanna keep a close eye on him.

## 2019-02-11 NOTE — Telephone Encounter (Signed)
Rx sent to pharmacy. Called and informed pt.  

## 2019-02-11 NOTE — Telephone Encounter (Signed)
Patient called and said that the Amoxkcalv prescription worked pretty good so far.  He is calling with an update

## 2019-02-12 NOTE — Telephone Encounter (Signed)
Noted. Will call pt on Monday 02/17/2019.

## 2019-02-13 ENCOUNTER — Ambulatory Visit: Payer: Self-pay | Admitting: Nurse Practitioner

## 2019-02-13 NOTE — Assessment & Plan Note (Signed)
The patient was recently seen at [facility] twice since Thanksgiving with repeated/persistent diverticulitis.  He completed a course of Cipro and apparently a course of Cipro and Flagyl.  When he completed his antibiotics his symptoms returned.  Noted nausea but no vomiting associated with the symptoms.  Recommended current treatments, call us if nausea becomes worse we can prescribe an antiemetic.  Further recommendations to follow.

## 2019-02-13 NOTE — Assessment & Plan Note (Signed)
Abdominal pain with 2 CT confirmed cases of diverticulitis.  He has completed 2 courses of antibiotics including a round of Cipro and and a round of Cipro and Flagyl.  When finishing his antibiotics he has persistent abdominal pain that is quite significant.  At this point we will plan to request ER labs, imaging, other records from [facility].  Given the fact he has had 2 CT scans in the past couple months I do not really want to pursue additional CT imaging unless it is absolutely necessary.  Given his recurrent symptoms if his CT scans in the emergency room to document diverticulitis will likely need to start another course of antibiotics.  I have also made surgery aware.  Further recommendations to follow.  Of note, the patient's blood pressure is elevated significantly today.  He is generally asymptomatic related to this.  I have recommended he call his primary care ASAP about his blood pressure.  If he has any signs or symptoms of severe blood pressure issues, which I have counseled him about, he is to proceed to the ER.  He verbalized understanding.

## 2019-02-13 NOTE — Assessment & Plan Note (Signed)
History of persistent diverticulitis x2 episodes since Thanksgiving where she was evaluated at [facility] and given 2 courses of antibiotics.  When he finished his most recent antibiotics his pain returned.  Further recommendations to follow.  We have requested records from [facility] as well.  Depending on what the CT scan shows at [facility] we may need to prescribe another course of antibiotics.  I will make surgery aware of his situation should he need urgent surgical consultation.

## 2019-02-17 ENCOUNTER — Other Ambulatory Visit: Payer: Self-pay

## 2019-02-17 ENCOUNTER — Telehealth: Payer: Self-pay

## 2019-02-17 DIAGNOSIS — R1013 Epigastric pain: Secondary | ICD-10-CM

## 2019-02-17 DIAGNOSIS — K921 Melena: Secondary | ICD-10-CM

## 2019-02-17 DIAGNOSIS — K625 Hemorrhage of anus and rectum: Secondary | ICD-10-CM

## 2019-02-17 NOTE — Telephone Encounter (Signed)
Old TCS/EGD orders cancelled. New orders entered.

## 2019-02-17 NOTE — Telephone Encounter (Signed)
Spoke with a family member. Pt will call back soon.

## 2019-02-17 NOTE — Telephone Encounter (Signed)
Pre-op and COVID test scheduled for 02/28/19. Called and informed pt. Letter mailed.

## 2019-02-27 ENCOUNTER — Encounter (HOSPITAL_COMMUNITY): Payer: Self-pay

## 2019-02-27 ENCOUNTER — Other Ambulatory Visit: Payer: Self-pay

## 2019-02-28 ENCOUNTER — Other Ambulatory Visit (HOSPITAL_COMMUNITY): Payer: Self-pay

## 2019-02-28 ENCOUNTER — Encounter (HOSPITAL_COMMUNITY)
Admission: RE | Admit: 2019-02-28 | Discharge: 2019-02-28 | Disposition: A | Discharge status: Home / Self Care | Payer: Self-pay | Source: Ambulatory Visit | Attending: Internal Medicine | Admitting: Internal Medicine

## 2019-02-28 ENCOUNTER — Other Ambulatory Visit (HOSPITAL_COMMUNITY)
Admission: RE | Admit: 2019-02-28 | Discharge: 2019-02-28 | Disposition: A | Discharge status: Home / Self Care | Payer: HRSA Program | Source: Ambulatory Visit | Attending: Internal Medicine | Admitting: Internal Medicine

## 2019-02-28 DIAGNOSIS — Z20822 Contact with and (suspected) exposure to covid-19: Secondary | ICD-10-CM | POA: Diagnosis not present

## 2019-02-28 DIAGNOSIS — Z01812 Encounter for preprocedural laboratory examination: Secondary | ICD-10-CM | POA: Insufficient documentation

## 2019-02-28 HISTORY — DX: Unspecified osteoarthritis, unspecified site: M19.90

## 2019-02-28 HISTORY — DX: Gastro-esophageal reflux disease without esophagitis: K21.9

## 2019-03-01 LAB — SARS CORONAVIRUS 2 (TAT 6-24 HRS): SARS Coronavirus 2: NEGATIVE

## 2019-03-04 ENCOUNTER — Encounter (HOSPITAL_COMMUNITY)
Admission: RE | Disposition: A | Discharge status: Home / Self Care | Payer: Self-pay | Source: Home / Self Care | Attending: Internal Medicine

## 2019-03-04 ENCOUNTER — Ambulatory Visit (HOSPITAL_COMMUNITY): Admission: RE | Admit: 2019-03-04 | Payer: Self-pay | Source: Home / Self Care | Admitting: Internal Medicine

## 2019-03-04 ENCOUNTER — Ambulatory Visit (HOSPITAL_COMMUNITY)
Admission: RE | Admit: 2019-03-04 | Discharge: 2019-03-04 | Disposition: A | Discharge status: Home / Self Care | Payer: Self-pay | Attending: Internal Medicine | Admitting: Internal Medicine

## 2019-03-04 ENCOUNTER — Ambulatory Visit (HOSPITAL_COMMUNITY): Payer: Self-pay | Admitting: Anesthesiology

## 2019-03-04 ENCOUNTER — Encounter (HOSPITAL_COMMUNITY): Payer: Self-pay | Admitting: Internal Medicine

## 2019-03-04 ENCOUNTER — Other Ambulatory Visit: Payer: Self-pay

## 2019-03-04 ENCOUNTER — Encounter (HOSPITAL_COMMUNITY): Admission: RE | Payer: Self-pay | Source: Home / Self Care

## 2019-03-04 DIAGNOSIS — K3189 Other diseases of stomach and duodenum: Secondary | ICD-10-CM | POA: Insufficient documentation

## 2019-03-04 DIAGNOSIS — K625 Hemorrhage of anus and rectum: Secondary | ICD-10-CM

## 2019-03-04 DIAGNOSIS — Z79899 Other long term (current) drug therapy: Secondary | ICD-10-CM | POA: Insufficient documentation

## 2019-03-04 DIAGNOSIS — K921 Melena: Secondary | ICD-10-CM | POA: Insufficient documentation

## 2019-03-04 DIAGNOSIS — K219 Gastro-esophageal reflux disease without esophagitis: Secondary | ICD-10-CM | POA: Insufficient documentation

## 2019-03-04 DIAGNOSIS — K5669 Other partial intestinal obstruction: Secondary | ICD-10-CM | POA: Insufficient documentation

## 2019-03-04 DIAGNOSIS — I10 Essential (primary) hypertension: Secondary | ICD-10-CM | POA: Insufficient documentation

## 2019-03-04 DIAGNOSIS — K6389 Other specified diseases of intestine: Secondary | ICD-10-CM | POA: Insufficient documentation

## 2019-03-04 DIAGNOSIS — R1013 Epigastric pain: Secondary | ICD-10-CM

## 2019-03-04 DIAGNOSIS — M1990 Unspecified osteoarthritis, unspecified site: Secondary | ICD-10-CM | POA: Insufficient documentation

## 2019-03-04 DIAGNOSIS — Z791 Long term (current) use of non-steroidal anti-inflammatories (NSAID): Secondary | ICD-10-CM | POA: Insufficient documentation

## 2019-03-04 DIAGNOSIS — F17210 Nicotine dependence, cigarettes, uncomplicated: Secondary | ICD-10-CM | POA: Insufficient documentation

## 2019-03-04 DIAGNOSIS — K5730 Diverticulosis of large intestine without perforation or abscess without bleeding: Secondary | ICD-10-CM | POA: Insufficient documentation

## 2019-03-04 DIAGNOSIS — K2950 Unspecified chronic gastritis without bleeding: Secondary | ICD-10-CM | POA: Insufficient documentation

## 2019-03-04 HISTORY — PX: COLONOSCOPY WITH PROPOFOL: SHX5780

## 2019-03-04 HISTORY — PX: BIOPSY: SHX5522

## 2019-03-04 HISTORY — PX: ESOPHAGOGASTRODUODENOSCOPY (EGD) WITH PROPOFOL: SHX5813

## 2019-03-04 SURGERY — COLONOSCOPY WITH PROPOFOL
Anesthesia: General

## 2019-03-04 SURGERY — COLONOSCOPY
Anesthesia: Moderate Sedation

## 2019-03-04 MED ORDER — PROPOFOL 10 MG/ML IV BOLUS
INTRAVENOUS | Status: AC
  Filled 2019-03-04: qty 120

## 2019-03-04 MED ORDER — LABETALOL HCL 5 MG/ML IV SOLN
5.0000 mg | Freq: Once | INTRAVENOUS | Status: AC
  Administered 2019-03-04: 5 mg via INTRAVENOUS

## 2019-03-04 MED ORDER — PROPOFOL 10 MG/ML IV BOLUS
INTRAVENOUS | Status: DC | PRN
  Administered 2019-03-04: 20 mg via INTRAVENOUS
  Administered 2019-03-04: 30 mg via INTRAVENOUS
  Administered 2019-03-04: 50 mg via INTRAVENOUS
  Administered 2019-03-04: 60 mg via INTRAVENOUS

## 2019-03-04 MED ORDER — GLYCOPYRROLATE PF 0.2 MG/ML IJ SOSY
PREFILLED_SYRINGE | INTRAMUSCULAR | Status: AC
  Filled 2019-03-04: qty 1

## 2019-03-04 MED ORDER — LABETALOL HCL 5 MG/ML IV SOLN: 10.0000 mg | Freq: Once | INTRAVENOUS | Status: DC

## 2019-03-04 MED ORDER — HYDRALAZINE HCL 20 MG/ML IJ SOLN
10.0000 mg | Freq: Once | INTRAMUSCULAR | Status: AC
  Administered 2019-03-04: 11:00:00 10 mg via INTRAVENOUS

## 2019-03-04 MED ORDER — LABETALOL HCL 5 MG/ML IV SOLN
INTRAVENOUS | Status: DC | PRN
  Administered 2019-03-04: 5 mg via INTRAVENOUS

## 2019-03-04 MED ORDER — HYDROMORPHONE HCL 1 MG/ML IJ SOLN: 0.2500 mg | INTRAMUSCULAR | Status: DC | PRN

## 2019-03-04 MED ORDER — HYDROCODONE-ACETAMINOPHEN 7.5-325 MG PO TABS: 1.0000 | ORAL_TABLET | Freq: Once | ORAL | Status: DC | PRN

## 2019-03-04 MED ORDER — LABETALOL HCL 5 MG/ML IV SOLN
INTRAVENOUS | Status: AC
  Filled 2019-03-04: qty 4

## 2019-03-04 MED ORDER — MIDAZOLAM HCL 2 MG/2ML IJ SOLN: 0.5000 mg | Freq: Once | INTRAMUSCULAR | Status: DC | PRN

## 2019-03-04 MED ORDER — LIDOCAINE HCL (CARDIAC) PF 50 MG/5ML IV SOSY
PREFILLED_SYRINGE | INTRAVENOUS | Status: DC | PRN
  Administered 2019-03-04: 50 mg via INTRAVENOUS

## 2019-03-04 MED ORDER — PROMETHAZINE HCL 25 MG/ML IJ SOLN: 6.2500 mg | INTRAMUSCULAR | Status: DC | PRN

## 2019-03-04 MED ORDER — LIDOCAINE 2% (20 MG/ML) 5 ML SYRINGE
INTRAMUSCULAR | Status: AC
  Filled 2019-03-04: qty 5

## 2019-03-04 MED ORDER — SODIUM CHLORIDE FLUSH 0.9 % IV SOLN
INTRAVENOUS | Status: AC
  Filled 2019-03-04: qty 10

## 2019-03-04 MED ORDER — HYDRALAZINE HCL 20 MG/ML IJ SOLN
INTRAMUSCULAR | Status: AC
  Filled 2019-03-04: qty 1

## 2019-03-04 MED ORDER — LACTATED RINGERS IV SOLN: INTRAVENOUS | Status: DC

## 2019-03-04 MED ORDER — KETAMINE HCL 50 MG/5ML IJ SOSY
PREFILLED_SYRINGE | INTRAMUSCULAR | Status: AC
  Filled 2019-03-04: qty 5

## 2019-03-04 MED ORDER — CHLORHEXIDINE GLUCONATE CLOTH 2 % EX PADS: 6.0000 | MEDICATED_PAD | Freq: Once | CUTANEOUS | Status: DC

## 2019-03-04 MED ORDER — PROPOFOL 500 MG/50ML IV EMUL
INTRAVENOUS | Status: DC | PRN
  Administered 2019-03-04: 100 ug/kg/min via INTRAVENOUS
  Administered 2019-03-04 (×2): 200 ug/kg/min via INTRAVENOUS

## 2019-03-04 MED ORDER — LABETALOL HCL 5 MG/ML IV SOLN
10.0000 mg | INTRAVENOUS | Status: AC | PRN
  Administered 2019-03-04 (×4): 10 mg via INTRAVENOUS

## 2019-03-04 MED ORDER — GLYCOPYRROLATE 0.2 MG/ML IJ SOLN
INTRAMUSCULAR | Status: DC | PRN
  Administered 2019-03-04: .2 mg via INTRAVENOUS

## 2019-03-04 MED ORDER — LABETALOL HCL 5 MG/ML IV SOLN
10.0000 mg | Freq: Once | INTRAVENOUS | Status: AC
  Administered 2019-03-04: 09:00:00 10 mg via INTRAVENOUS

## 2019-03-04 MED ORDER — KETAMINE HCL 10 MG/ML IJ SOLN
INTRAMUSCULAR | Status: DC | PRN
  Administered 2019-03-04: 10 mg via INTRAVENOUS
  Administered 2019-03-04: 20 mg via INTRAVENOUS

## 2019-03-04 NOTE — Discharge Instructions (Signed)
Colonoscopy Discharge Instructions  Read the instructions outlined below and refer to this sheet in the next few weeks. These discharge instructions provide you with general information on caring for yourself after you leave the hospital. Your doctor may also give you specific instructions. While your treatment has been planned according to the most current medical practices available, unavoidable complications occasionally occur. If you have any problems or questions after discharge, call Dr. Gala Romney at [phone]. ACTIVITY  You may resume your regular activity, but move at a slower pace for the next 24 hours.   Take frequent rest periods for the next 24 hours.   Walking will help get rid of the air and reduce the bloated feeling in your belly (abdomen).   No driving for 24 hours (because of the medicine (anesthesia) used during the test).    Do not sign any important legal documents or operate any machinery for 24 hours (because of the anesthesia used during the test).  NUTRITION  Drink plenty of fluids.   You may resume your normal diet as instructed by your doctor.   Begin with a light meal and progress to your normal diet. Heavy or fried foods are harder to digest and may make you feel sick to your stomach (nauseated).   Avoid alcoholic beverages for 24 hours or as instructed.  MEDICATIONS  You may resume your normal medications unless your doctor tells you otherwise.  WHAT YOU CAN EXPECT TODAY  Some feelings of bloating in the abdomen.   Passage of more gas than usual.   Spotting of blood in your stool or on the toilet paper.  IF YOU HAD POLYPS REMOVED DURING THE COLONOSCOPY:  No aspirin products for 7 days or as instructed.   No alcohol for 7 days or as instructed.   Eat a soft diet for the next 24 hours.  FINDING OUT THE RESULTS OF YOUR TEST Not all test results are available during your visit. If your test results are not back during the visit, make an appointment  with your caregiver to find out the results. Do not assume everything is normal if you have not heard from your caregiver or the medical facility. It is important for you to follow up on all of your test results.  SEEK IMMEDIATE MEDICAL ATTENTION IF:  You have more than a spotting of blood in your stool.   Your belly is swollen (abdominal distention).   You are nauseated or vomiting.   You have a temperature over 101.   You have abdominal pain or discomfort that is severe or gets worse throughout the day.    EGD Discharge instructions Please read the instructions outlined below and refer to this sheet in the next few weeks. These discharge instructions provide you with general information on caring for yourself after you leave the hospital. Your doctor may also give you specific instructions. While your treatment has been planned according to the most current medical practices available, unavoidable complications occasionally occur. If you have any problems or questions after discharge, please call your doctor. ACTIVITY  You may resume your regular activity but move at a slower pace for the next 24 hours.   Take frequent rest periods for the next 24 hours.   Walking will help expel (get rid of) the air and reduce the bloated feeling in your abdomen.   No driving for 24 hours (because of the anesthesia (medicine) used during the test).   You may shower.   Do not sign  any important legal documents or operate any machinery for 24 hours (because of the anesthesia used during the test).  NUTRITION  Drink plenty of fluids.   You may resume your normal diet.   Begin with a light meal and progress to your normal diet.   Avoid alcoholic beverages for 24 hours or as instructed by your caregiver.  MEDICATIONS  You may resume your normal medications unless your caregiver tells you otherwise.  WHAT YOU CAN EXPECT TODAY  You may experience abdominal discomfort such as a feeling of  fullness or "gas" pains.  FOLLOW-UP  Your doctor will discuss the results of your test with you.  SEEK IMMEDIATE MEDICAL ATTENTION IF ANY OF THE FOLLOWING OCCUR:  Excessive nausea (feeling sick to your stomach) and/or vomiting.   Severe abdominal pain and distention (swelling).   Trouble swallowing.   Temperature over 101 F (37.8 C).   Rectal bleeding or vomiting of blood.   Continue Protonix 40 mg daily.  Avoid Celebrex, ibuprofen Aleve, all NSAIDs  Further recommendations to follow once biopsy report from your stomach returns  NSAIDs like mentioned above have been damaging your stomach  You had a large number of diverticula or pockets in your colon.  On the left side he had a segment which looks chronically inflamed and stenotic.  No sign of cancer or polyp.  You may need to see a surgeon in the near future and get this segment of your colon removed.  We will decide which way to go when you come back to see [place] in 6 weeks  You should begin Benefiber 1 tablespoon daily for 3 weeks and increase to 2 tablespoons daily thereafter  Repeat colonoscopy in 10 years for screening purposes.

## 2019-03-04 NOTE — OR Nursing (Signed)
Dr. Lemont Fillers notified of diastolic blood pressure 105-110. Patient took BP meds this morning. Will continue to monitor.

## 2019-03-04 NOTE — H&P (Signed)
@LOGO @   Primary Care Physician:  , MD Primary Gastroenterologist:  Dr. Waldon Reining  Pre-Procedure History & Physical: HPI:  Anthony Vaughn is a 49 y.o. male here for further evaluation of epigastric pain, melena rectal bleeding abnormal: Imaging via CT. here for EGD and colonoscopy.  Denies dysphagia. Abdominal pain has improved significantly.  Takes Protonix 40 mg daily avoiding NSAIDs now. Blood pressure up today.  History of hypertension.  Being evaluated and treated by PCP.  Past Medical History:  Diagnosis Date  . Arthritis   . Diverticulitis   . GERD (gastroesophageal reflux disease)   . HTN (hypertension)     Past Surgical History:  Procedure Laterality Date  . COLONOSCOPY    . NO PAST SURGERIES      Prior to Admission medications   Medication Sig Start Date End Date Taking? Authorizing Provider  carvedilol (COREG) 12.5 MG tablet Take 12.5 mg by mouth 2 (two) times daily with a meal.   Yes [provider]  lisinopril (PRINIVIL,ZESTRIL) 20 MG tablet Take 1 tablet (20 mg total) by mouth daily. 01/08/13  Yes 14/3/14, MD  pantoprazole (PROTONIX) 40 MG tablet Take 1 tablet (40 mg total) by mouth 2 (two) times daily. 10/21/18 04/19/19 Yes 04/21/19 S, PA-C  polyethylene glycol-electrolytes (TRILYTE) 420 g solution Take 4,000 mLs by mouth as directed. 02/11/19  Yes Geana Walts, 04/11/19, MD  CELEBREX 100 MG capsule Take 1 capsule by mouth daily as needed. 09/24/18   [provider]    Allergies as of 02/17/2019 - Review Complete 02/04/2019  Allergen Reaction Noted  . Other  02/04/2019    Family History  Problem Relation Age of Onset  . Colon cancer Neg Hx   . Colon polyps Neg Hx     Social History   Socioeconomic History  . Marital status: Single    Spouse name: Not on file  . Number of children: Not on file  . Years of education: Not on file  . Highest education level: Not on file  Occupational History  . Not on file  Tobacco  Use  . Smoking status: Current Every Day Smoker    Packs/day: 0.25    Years: 25.00    Pack years: 6.25    Types: Cigarettes  . Smokeless tobacco: Never Used  Substance and Sexual Activity  . Alcohol use: No  . Drug use: No  . Sexual activity: Yes  Other Topics Concern  . Not on file  Social History Narrative  . Not on file   Social Determinants of Health   Financial Resource Strain:   . Difficulty of Paying Living Expenses: Not on file  Food Insecurity:   . Worried About 02/06/2019 in the Last Year: Not on file  . Ran Out of Food in the Last Year: Not on file  Transportation Needs:   . Lack of Transportation (Medical): Not on file  . Lack of Transportation (Non-Medical): Not on file  Physical Activity:   . Days of Exercise per Week: Not on file  . Minutes of Exercise per Session: Not on file  Stress:   . Feeling of Stress : Not on file  Social Connections:   . Frequency of Communication with Friends and Family: Not on file  . Frequency of Social Gatherings with Friends and Family: Not on file  . Attends Religious Services: Not on file  . Active Member of Clubs or Organizations: Not on file  . Attends Programme researcher, broadcasting/film/video Meetings:  Not on file  . Marital Status: Not on file  Intimate Partner Violence:   . Fear of Current or Ex-Partner: Not on file  . Emotionally Abused: Not on file  . Physically Abused: Not on file  . Sexually Abused: Not on file    Review of Systems: See HPI, otherwise negative ROS  Physical Exam: BP (!) 158/112   Pulse 81   Temp 97.9 F (36.6 C) (Oral)   Resp 19   Ht 6' (1.829 m)   Wt 68.5 kg   SpO2 100%   BMI 20.48 kg/m  General:   Alert,  Well-developed, well-nourished, pleasant and cooperative in NAD Neck:  Supple; no masses or thyromegaly. No significant cervical adenopathy. Lungs:  Clear throughout to auscultation.   No wheezes, crackles, or rhonchi. No acute distress. Heart:  Regular rate and rhythm; no murmurs, clicks,  rubs,  or gallops. Abdomen: Non-distended, normal bowel sounds.  Soft and nontender without appreciable mass or hepatosplenomegaly.  Pulses:  Normal pulses noted. Extremities:  Without clubbing or edema.  Impression/Plan: 49 year old here for further evaluation of epigastric pain melena, abnormal CT imaging consistent with diverticulitis.  No dysphagia.  EGD and colonoscopy today.  The risks, benefits, limitations, imponderables and alternatives regarding both EGD and colonoscopy have been reviewed with the patient. Questions have been answered. All parties agreeable.   Discussed with Dr. Loretha Stapler.  Blood pressure elevated today.  Further recommendations to follow pending results of today's evaluation.     Notice: This dictation was prepared with Dragon dictation along with smaller phrase technology. Any transcriptional errors that result from this process are unintentional and may not be corrected upon review.

## 2019-03-04 NOTE — Op Note (Signed)
[facility] Patient Name: Anthony Vaughn Procedure Date: 03/04/2019 7:16 AM MRN: [id number] Date of Birth: 1970/07/25 Attending MD: Gennette Pac , MD CSN: [id number] Age: 49 Admit Type: Outpatient Procedure:                Upper GI endoscopy Indications:              Epigastric abdominal pain Providers:                Gennette Pac, MD, Jannett Celestine, RN Referring MD:              Medicines:                Propofol per Anesthesia Complications:            No immediate complications. Estimated blood loss:                            Minimal. Estimated Blood Loss:     Estimated blood loss was minimal. Procedure:                Pre-Anesthesia Assessment:                           - Prior to the procedure, a History and Physical                            was performed, and patient medications and                            allergies were reviewed. The patient's tolerance of                            previous anesthesia was also reviewed. The risks                            and benefits of the procedure and the sedation                            options and risks were discussed with the patient.                            All questions were answered, and informed consent                            was obtained. Prior Anticoagulants: The patient has                            taken no previous anticoagulant or antiplatelet                            agents. ASA Grade Assessment: II - A patient with                            mild systemic disease. After reviewing the risks  and benefits, the patient was deemed in                            satisfactory condition to undergo the procedure.                           After obtaining informed consent, the endoscope was                            passed under direct vision. Throughout the                            procedure, the patient's blood pressure, pulse, and   oxygen saturations were monitored continuously. The                            [id number]-[id number] ([id number]) scope was introduced through the                            mouth, and advanced to the second part of duodenum.                            The upper GI endoscopy was accomplished without                            difficulty. The patient tolerated the procedure                            well. Scope In: 7:41:58 AM Scope Out: 7:47:07 AM Total Procedure Duration: 0 hours 5 minutes 9 seconds  Findings:      The examined esophagus was normal. Abnormal stomach with diffuse       erosions and erythema/. No ulcer or infiltrating process was observed.       Pylorus patent. Friability      The duodenal bulb and second portion of the duodenum were normal aside       from a noncritical web separating the bulb and the second portion.       Finally, the abnormal gastric mucosa was biopsied with a cold forceps       for histology. Estimated blood loss was minimal. Impression:               - Normal esophagus.                           - Mucosal changes in the stomach. Biopsied. Likely                            NSAID effect. Rule out H. pylori                           - Normal duodenal bulb and second portion of the                            duodenum aside from a noncritical duodenal  web?"likely NSAID related. Moderate Sedation:      Moderate (conscious) sedation was personally administered by an       anesthesia professional. The following parameters were monitored: oxygen       saturation, heart rate, blood pressure, respiratory rate, EKG, adequacy       of pulmonary ventilation, and response to care. Recommendation:           - Patient has a contact number available for                            emergencies. The signs and symptoms of potential                            delayed complications were discussed with the                            patient. Return to normal  activities tomorrow.                            Written discharge instructions were provided to the                            patient.                           - Advance diet as tolerated.                           - Continue present medications. Avoid NSAIDs.                            Continue Protonix. Follow-up on pathology. See                            colonoscopy report                           - Return to my office in 6 weeks. Procedure Code(s):        --- Professional ---                           [phone], Esophagogastroduodenoscopy, flexible,                            transoral; diagnostic, including collection of                            specimen(s) by brushing or washing, when performed                            (separate procedure) Diagnosis Code(s):        --- Professional ---                           K31.89, Other diseases of stomach and duodenum  R10.13, Epigastric pain CPT copyright 2019 American Medical Association. All rights reserved. The codes documented in this report are preliminary and upon coder review may  be revised to meet current compliance requirements. Anthony Vaughn. Anthony Spoerl, MD Anthony Richards, MD 03/04/2019 8:17:27 AM This report has been signed electronically. Number of Addenda: 0

## 2019-03-04 NOTE — Anesthesia Procedure Notes (Signed)
Procedure Name: MAC Date/Time: 03/04/2019 7:32 AM Performed by: Vista Deck, CRNA Pre-anesthesia Checklist: Patient identified, Emergency Drugs available, Suction available, Timeout performed and Patient being monitored Patient Re-evaluated:Patient Re-evaluated prior to induction Oxygen Delivery Method: Non-rebreather mask

## 2019-03-04 NOTE — Progress Notes (Signed)
Patient blood pressure high on arrival to Short Stay.  BP reading was high as 170/117.  He was treated with Labetalol and HCTZ IV.  BP at discharge was 139/89.  We made him an appointment with his primary, Dr. [name] in [facility] for a follow up on his BP in the am.Pt was stable at discharge.

## 2019-03-04 NOTE — Anesthesia Postprocedure Evaluation (Signed)
Anesthesia Post Note  Patient: Anthony Vaughn  Procedure(s) Performed: COLONOSCOPY WITH PROPOFOL (N/A ) ESOPHAGOGASTRODUODENOSCOPY (EGD) WITH PROPOFOL (N/A ) BIOPSY  Patient location during evaluation: PACU Anesthesia Type: General Level of consciousness: awake and alert and patient cooperative Pain management: satisfactory to patient Respiratory status: spontaneous breathing Cardiovascular status: stable Postop Assessment: no apparent nausea or vomiting Anesthetic complications: no     Last Vitals:  Vitals:   03/04/19 0715 03/04/19 0820  BP: (!) 158/112 (!) 121/92  Pulse:  82  Resp: 19 20  Temp:  (P) 36.6 C  SpO2: 100% 100%    Last Pain:  Vitals:   03/04/19 0647  TempSrc: Oral  PainSc: 0-No pain                 Giovonni Poirier

## 2019-03-04 NOTE — Transfer of Care (Signed)
Immediate Anesthesia Transfer of Care Note  Patient: Anthony Vaughn  Procedure(s) Performed: COLONOSCOPY WITH PROPOFOL (N/A ) ESOPHAGOGASTRODUODENOSCOPY (EGD) WITH PROPOFOL (N/A ) BIOPSY  Patient Location: PACU  Anesthesia Type:General  Level of Consciousness: awake, alert  and patient cooperative  Airway & Oxygen Therapy: Patient Spontanous Breathing  Post-op Assessment: Report given to RN and Post -op Vital signs reviewed and stable  Post vital signs: Reviewed and stable  Last Vitals:  Vitals Value Taken Time  BP    Temp 97.6   Pulse 82 03/04/19 0819  Resp    SpO2 100 % 03/04/19 0819  Vitals shown include unvalidated device data.  Last Pain:  Vitals:   03/04/19 0647  TempSrc: Oral  PainSc: 0-No pain      Patients Stated Pain Goal: 8 (03/04/19 4473)  Complications: No apparent anesthesia complications

## 2019-03-04 NOTE — Op Note (Addendum)
[facility] Patient Name: Anthony Vaughn Procedure Date: 03/04/2019 7:49 AM MRN: [id number] Date of Birth: 04-14-1970 Attending MD: Gennette Pac , MD CSN: [id number] Age: 49 Admit Type: Outpatient Procedure:                Colonoscopy Indications:              Hematochezia, Abnormal CT of the GI tract Providers:                Gennette Pac, MD, Jannett Celestine, RN Referring MD:              Medicines:                Propofol per Anesthesia Complications:            No immediate complications. Estimated Blood Loss:     Estimated blood loss: none. Procedure:                Pre-Anesthesia Assessment:                           - Prior to the procedure, a History and Physical                            was performed, and patient medications and                            allergies were reviewed. The patient's tolerance of                            previous anesthesia was also reviewed. The risks                            and benefits of the procedure and the sedation                            options and risks were discussed with the patient.                            All questions were answered, and informed consent                            was obtained. Prior Anticoagulants: The patient has                            taken no previous anticoagulant or antiplatelet                            agents. ASA Grade Assessment: II - A patient with                            mild systemic disease. After reviewing the risks                            and benefits, the patient was deemed in  satisfactory condition to undergo the procedure.                           After obtaining informed consent, the colonoscope                            was passed under direct vision. Throughout the                            procedure, the patient's blood pressure, pulse, and                            oxygen saturations were monitored continuously. The                        [id number] ([id number] scope was introduced through                            the anus and advanced to the the cecum, identified                            by appendiceal orifice and ileocecal valve. The                            colonoscopy was performed without difficulty. The                            patient tolerated the procedure well. Scope In: 7:52:38 AM Scope Out: 8:11:46 AM Scope Withdrawal Time: 0 hours 9 minutes 23 seconds  Total Procedure Duration: 0 hours 19 minutes 8 seconds  Findings:      The perianal and digital rectal examinations were normal.      Scattered small and large-mouthed diverticula were found in the entire       colon. Significant chronic inflammatory changes involving a proximal 15       cm segment of sigmoid. Colon somewhat noncompliant through this segment.       Lumen narrowed consistent with stenosis. I was able to negotiate the       adult colonoscope through it without difficulty. I did not see any       adenomatous or neoplastic changes anywhere in the colon. Patient had       grade 1 internal hemorrhoids. Remainder the examination was unremarkable. Impression:               - Diverticulosis in the entire examined colon.                            Stenotic, stiff chronically inflamed sigmoid                            segment as described above.                           - No specimens collected. Moderate Sedation:      Moderate (conscious) sedation was personally administered by an       anesthesia professional. The following parameters were monitored: oxygen  saturation, heart rate, blood pressure, respiratory rate, EKG, adequacy       of pulmonary ventilation, and response to care. Recommendation:           - Patient has a contact number available for                            emergencies. The signs and symptoms of potential                            delayed complications were discussed with the                             patient. Return to normal activities tomorrow.                            Written discharge instructions were provided to the                            patient.                           - Advance diet as tolerated. Begin Benefiber 1                            tablespoon daily for 3 weeks then increase to twice                            daily thereafter. Would have a low threshold for                            surgical consultation for sigmoid colectomy. Plan                            to see Anthony Vaughn back in the office in 6 weeks and                            reassess. See EGD report. Repeat colonoscopy in 10                            years for screening purposes. Procedure Code(s):        --- Professional ---                           [phone], Colonoscopy, flexible; diagnostic, including                            collection of specimen(s) by brushing or washing,                            when performed (separate procedure) Diagnosis Code(s):        --- Professional ---                           K92.1, Melena (includes Hematochezia)  K57.30, Diverticulosis of large intestine without                            perforation or abscess without bleeding                           R93.3, Abnormal findings on diagnostic imaging of                            other parts of digestive tract CPT copyright 2019 American Medical Association. All rights reserved. The codes documented in this report are preliminary and upon coder review may  be revised to meet current compliance requirements. Anthony Vaughn. Barbarajean Kinzler, MD Gennette Pac, MD 03/04/2019 8:23:54 AM This report has been signed electronically. Number of Addenda: 0

## 2019-03-04 NOTE — Anesthesia Preprocedure Evaluation (Addendum)
Anesthesia Evaluation  Patient identified by MRN, date of birth, ID band Patient awake    Reviewed: Allergy & Precautions, NPO status , Patient's Chart, lab work & pertinent test results  Airway Mallampati: I  TM Distance: >3 FB Neck ROM: Full    Dental no notable dental hx. (+) Teeth Intact, Poor Dentition   Pulmonary neg pulmonary ROS, Current Smoker and Patient abstained from smoking.,    Pulmonary exam normal breath sounds clear to auscultation       Cardiovascular Exercise Tolerance: Good hypertension, Pt. on medications and Pt. on home beta blockers negative cardio ROS Normal cardiovascular examI Rhythm:Regular Rate:Normal  Recently changed meds adding coreg for HTN   Neuro/Psych negative neurological ROS  negative psych ROS   GI/Hepatic Neg liver ROS, Bowel prep,GERD  Medicated and Controlled,States only uses GERD meds PRN Here for Colon /EGD   Endo/Other  negative endocrine ROS  Renal/GU negative Renal ROS  negative genitourinary   Musculoskeletal  (+) Arthritis , Osteoarthritis,    Abdominal   Peds negative pediatric ROS (+)  Hematology negative hematology ROS (+)   Anesthesia Other Findings   Reproductive/Obstetrics negative OB ROS                            Anesthesia Physical Anesthesia Plan  ASA: II  Anesthesia Plan: General   Post-op Pain Management:    Induction: Intravenous  PONV Risk Score and Plan: 1 and Propofol infusion, TIVA and Treatment may vary due to age or medical condition  Airway Management Planned: Nasal Cannula and Simple Face Mask  Additional Equipment:   Intra-op Plan:   Post-operative Plan:   Informed Consent: I have reviewed the patients History and Physical, chart, labs and discussed the procedure including the risks, benefits and alternatives for the proposed anesthesia with the patient or authorized representative who has indicated  his/her understanding and acceptance.     Dental advisory given  Plan Discussed with: CRNA  Anesthesia Plan Comments: (Plan Full PPE use  Plan GA with GETA as needed d/w pt -WTP with same after Q&A  Pt with DBP ~110, anxious having BP meds adjusted, s/p prep - low risk procedure- d/w pt going at minimally increased risk - d/w Dr. Jena Gauss -both WTP)       Anesthesia Quick Evaluation

## 2019-03-05 ENCOUNTER — Encounter: Payer: Self-pay | Admitting: Internal Medicine

## 2019-03-05 ENCOUNTER — Other Ambulatory Visit: Payer: Self-pay

## 2019-03-05 LAB — SURGICAL PATHOLOGY

## 2019-04-15 ENCOUNTER — Ambulatory Visit (INDEPENDENT_AMBULATORY_CARE_PROVIDER_SITE_OTHER): Payer: Self-pay | Admitting: Nurse Practitioner

## 2019-04-15 ENCOUNTER — Encounter: Payer: Self-pay | Admitting: Nurse Practitioner

## 2019-04-15 ENCOUNTER — Other Ambulatory Visit: Payer: Self-pay | Admitting: *Deleted

## 2019-04-15 ENCOUNTER — Other Ambulatory Visit: Payer: Self-pay

## 2019-04-15 VITALS — BP 156/106 | HR 81 | Temp 97.3°F | Ht 72.0 in | Wt 158.0 lb

## 2019-04-15 DIAGNOSIS — R1013 Epigastric pain: Secondary | ICD-10-CM

## 2019-04-15 DIAGNOSIS — Z8719 Personal history of other diseases of the digestive system: Secondary | ICD-10-CM

## 2019-04-15 DIAGNOSIS — R1030 Lower abdominal pain, unspecified: Secondary | ICD-10-CM

## 2019-04-15 NOTE — Patient Instructions (Addendum)
Your health issues we discussed today were:   Repeated diverticulitis with abdominal pain: 1. I am glad you are feeling better 2. We will refer you to a local surgeon to discuss possible removal of a small part of your colon 3. Call us if you have any worsening or severe abdominal pain  Upper abdominal pain with GERD (heartburn/reflux): 1. I am glad you are doing well at this 2. Continue your acid blocker (Protonix) 3. Call us if you have any worsening or severe symptoms  Overall I recommend:  1. Continue your other current medications 2. Return for follow-up in 6 months 3. Call us if you have any questions or concerns.   ---------------------------------------------------------------  COVID-19 Vaccine Information can be found at: [email or web address] For questions related to vaccine distribution or appointments, please email [email or web address] or call [phone].   ---------------------------------------------------------------   At [facility] we value your feedback. You may receive a survey about your visit today. Please share your experience as we strive to create trusting relationships with our patients to provide genuine, compassionate, quality care.  We appreciate your understanding and patience as we review any laboratory studies, imaging, and other diagnostic tests that are ordered as we care for you. Our office policy is 5 business days for review of these results, and any emergent or urgent results are addressed in a timely manner for your best interest. If you do not hear from our office in 1 week, please contact us.   We also encourage the use of MyChart, which contains your medical information for your review as well. If you are not enrolled in this feature, an access code is on this after visit summary for your convenience. Thank you for allowing [place] to be involved in your care.  It was great  to see you today!  I hope you have a great day!!

## 2019-04-15 NOTE — Assessment & Plan Note (Signed)
Abdominal pain significantly improved at this time.  Continues with fiber supplements.  Recommend he follow-up in 6 months and notify us of any worsening or severe abdominal pain.

## 2019-04-15 NOTE — Assessment & Plan Note (Signed)
Epigastric pain likely related to GERD, significantly improved on PPI.  Recommend he continue his current medications and follow-up in 6 months.

## 2019-04-15 NOTE — Progress Notes (Signed)
Referring Provider: Leonie Douglas, MD Primary Care Physician:  Leonie Douglas, MD Primary GI:  Dr. Gala Romney  Chief Complaint  Patient presents with  . Follow-up    FU from EGD/TCS, mild abd pain    HPI:   Anthony Vaughn is a 49 y.o. male who presents for post procedure follow-up.  The patient was last seen in our office 02/04/2019 for lower abdominal pain, nausea, history of diverticulitis.  Noted history of diverticulosis/diverticulitis, epigastric pain in the setting of NSAIDs which improved after PPI although epigastric pain persist and he had some hematochezia.  For this recommended colonoscopy and endoscopy which was initially scheduled for November 2020, but was a no-show to his Covid testing and required rescheduling.  He called our office the day before his last office visit with complaints of intermittent abdominal pain status post antibiotic treatment for presumptive diverticulitis.  Noted persistent nausea.  When we saw him in the office he indicated he had been to [facility] twice since Thanksgiving with CT showing diverticulitis "and may be something else to?"  He was discharged on 2 antibiotics due to high census, rather than admission.  It appears over the course of his 2 visits he was initially treated with just a course of Cipro and then on return treated with Cipro and Flagyl.  Notes persistent lower abdominal pain that returned after completing antibiotics.  Some nausea but no vomiting.  Twice daily loose stools but no bowel movement in a couple days.  No other overt GI complaints.  Records were requested from [place] need for more antibiotics based on CT findings.  Recommended review previous records and make further recommendations.  Review of CT found diverticulitis x2 episodes and persistent symptoms for which I prescribed Augmentin 875 mg 3 times a day twice daily for 10 days.  He noted improvement with antibiotics.  Recommended colonoscopy and  endoscopy after acute illness subsided.  Likely need for surgical referral due to recurrent diverticulitis.  EGD completed 03/04/2019 which found normal esophagus and mucosal changes status post biopsy, normal duodenum.  Surgical pathology found the biopsies to be chronic gastritis and focal interstitial metaplasia negative for dysplasia and negative for H. pylori.  Recommended continue current medications including PPI, avoid all NSAIDs.  Colonoscopy completed the same day found diverticulosis in the entire examined colon with a stenotic, stiff, chronically inflamed sigmoid segment.  Recommended Benefiber and deemed low threshold for surgical consultation for sigmoid colectomy.  Repeat colonoscopy in 10 years.  Today he states he's doing ok overall. Still with some mild abdominal pain, significantly improved since TCS. His BP is up today, no symptoms such as headache, dizziness, dyspnea, chest pain. PCP is working on medication titrations, has a follow-up appointment in 3 days He was doing well after TCS with diet changes. He tried fried chicken one day and had a flare of abdominal pain lower abdomen. Did have some mild hematochezia x 1 episode with his abdominal flare; none since. Denies melena, fever, chills, unintentional weight loss. Denies URI or flu-like symptoms. Denies loss of sense of taste or smell. Ws tested for COVID-19 prior to colonoscopy which was negative. Denies chest pain, dyspnea, dizziness, lightheadedness, syncope, near syncope. Denies any other upper or lower GI symptoms.  Past Medical History:  Diagnosis Date  . Arthritis   . Diverticulitis   . GERD (gastroesophageal reflux disease)   . HTN (hypertension)     Past Surgical History:  Procedure Laterality Date  . BIOPSY  03/04/2019   Procedure: BIOPSY;  Surgeon: Corbin Ade, MD;  [facility];  Service: Endoscopy;;  gastric  . COLONOSCOPY    . COLONOSCOPY WITH PROPOFOL N/A 03/04/2019   Procedure: COLONOSCOPY WITH  PROPOFOL;  Surgeon: Corbin Ade, MD;  [facility];  Service: Endoscopy;  Laterality: N/A;  7:30am  . ESOPHAGOGASTRODUODENOSCOPY (EGD) WITH PROPOFOL N/A 03/04/2019   Procedure: ESOPHAGOGASTRODUODENOSCOPY (EGD) WITH PROPOFOL;  Surgeon: Corbin Ade, MD;  [facility];  Service: Endoscopy;  Laterality: N/A;  . NO PAST SURGERIES      Current Outpatient Medications  Medication Sig Dispense Refill  . ALISKIREN-AMLODIPINE PO Take by mouth. Pt not sure of dosage.    . carvedilol (COREG) 12.5 MG tablet Take 12.5 mg by mouth 2 (two) times daily with a meal.    . lisinopril (PRINIVIL,ZESTRIL) 20 MG tablet Take 1 tablet (20 mg total) by mouth daily. 30 tablet 1  . pantoprazole (PROTONIX) 40 MG tablet Take 1 tablet (40 mg total) by mouth 2 (two) times daily. 60 tablet 5  . Wheat Dextrin (BENEFIBER DRINK MIX PO) Take by mouth. Takes 2 tablespoons daily.     No current facility-administered medications for this visit.    Allergies as of 04/15/2019 - Review Complete 04/15/2019  Allergen Reaction Noted  . Other  02/04/2019    Family History  Problem Relation Age of Onset  . Colon cancer Neg Hx   . Colon polyps Neg Hx     Social History   Socioeconomic History  . Marital status: Single    Spouse name: Not on file  . Number of children: Not on file  . Years of education: Not on file  . Highest education level: Not on file  Occupational History  . Not on file  Tobacco Use  . Smoking status: Current Every Day Smoker    Packs/day: 0.25    Years: 25.00    Pack years: 6.25    Types: Cigarettes  . Smokeless tobacco: Never Used  Substance and Sexual Activity  . Alcohol use: No  . Drug use: No  . Sexual activity: Yes  Other Topics Concern  . Not on file  Social History Narrative  . Not on file   Social Determinants of Health   Financial Resource Strain:   . Difficulty of Paying Living Expenses: Not on file  Food Insecurity:   . Worried About Brewing technologist in the Last Year: Not on file  . Ran Out of Food in the Last Year: Not on file  Transportation Needs:   . Lack of Transportation (Medical): Not on file  . Lack of Transportation (Non-Medical): Not on file  Physical Activity:   . Days of Exercise per Week: Not on file  . Minutes of Exercise per Session: Not on file  Stress:   . Feeling of Stress : Not on file  Social Connections:   . Frequency of Communication with Friends and Family: Not on file  . Frequency of Social Gatherings with Friends and Family: Not on file  . Attends Religious Services: Not on file  . Active Member of Clubs or Organizations: Not on file  . Attends Banker Meetings: Not on file  . Marital Status: Not on file    Review of Systems: General: Negative for anorexia, weight loss, fever, chills, fatigue, weakness. ENT: Negative for hoarseness, difficulty swallowing. CV: Negative for chest pain, angina, palpitations, peripheral edema.  Respiratory: Negative for dyspnea  at rest, cough, sputum, wheezing.  GI: See history of present illness. Endo: Negative for unusual weight change.  Heme: Negative for bruising or bleeding. Allergy: Negative for rash or hives.   Physical Exam: BP (!) 156/106   Pulse 81   Temp (!) 97.3 F (36.3 C) (Temporal)   Ht 6' (1.829 m)   Wt 158 lb (71.7 kg)   BMI 21.43 kg/m  General:   Alert and oriented. Pleasant and cooperative. Well-nourished and well-developed.  Eyes:  Without icterus, sclera clear and conjunctiva pink.  Ears:  Normal auditory acuity. Cardiovascular:  S1, S2 present without murmurs appreciated. Extremities without clubbing or edema. Respiratory:  Clear to auscultation bilaterally. No wheezes, rales, or rhonchi. No distress.  Gastrointestinal:  +BS, soft, non-tender and non-distended. No HSM noted. No guarding or rebound. No masses appreciated.  Rectal:  Deferred  Musculoskalatal:  Symmetrical without gross deformities. Neurologic:  Alert and  oriented x4;  grossly normal neurologically. Psych:  Alert and cooperative. Normal mood and affect. Heme/Lymph/Immune: No excessive bruising noted.    04/15/2019 9:22 AM   Disclaimer: This note was dictated with voice recognition software. Similar sounding words can inadvertently be transcribed and may not be corrected upon review.

## 2019-04-15 NOTE — Assessment & Plan Note (Signed)
History of multiple, recurrent bouts of diverticulitis as per HPI.  Overall he is feeling better, abdominal pain significantly improved.  Is taking Benefiber.  Colonoscopy revealed stenosed, stiff, chronically inflamed sigmoid colon.  Also noted pancolonic diverticula.  Previous CT imagings with recurrent diverticulitis generally found in the distal descending/proximal sigmoid colon.  At this point we will refer him for surgical evaluation due to chronic changes of diverticulitis and repeated acute flares of diverticulitis.  Follow-up in 6 months.

## 2019-05-01 ENCOUNTER — Ambulatory Visit (INDEPENDENT_AMBULATORY_CARE_PROVIDER_SITE_OTHER): Payer: Self-pay | Admitting: General Surgery

## 2019-05-01 ENCOUNTER — Encounter: Payer: Self-pay | Admitting: General Surgery

## 2019-05-01 ENCOUNTER — Encounter (INDEPENDENT_AMBULATORY_CARE_PROVIDER_SITE_OTHER): Payer: Self-pay

## 2019-05-01 ENCOUNTER — Other Ambulatory Visit: Payer: Self-pay

## 2019-05-01 VITALS — BP 147/97 | HR 70 | Temp 97.9°F | Resp 12 | Ht 72.0 in | Wt 160.0 lb

## 2019-05-01 DIAGNOSIS — K5732 Diverticulitis of large intestine without perforation or abscess without bleeding: Secondary | ICD-10-CM

## 2019-05-01 NOTE — Patient Instructions (Signed)
CT scan with rectal contrast will be ordered. Please call next week if you have not heard anything.  Diverticulosis/ Diverticulitis Information and Diet:   Diverticulosis is a condition in which small, bulging pouches (diverticuli) form inside the lower part of the intestine, usually in the colon. Constipation and straining during bowel movements can worsen the condition. A diet rich in fiber can help keep stools soft and prevent inflammation.  Diverticulitis occurs when the pouches in the colon become infected or inflamed. Dietary changes can help the colon heal.  Fiber is an important part of the diet for patients with diverticulosis. A high-fiber diet softens and gives bulk to the stool, allowing it to pass quickly and easily.  Diet for Diverticulosis Eat a high-fiber diet when you have diverticulosis. Fiber softens the stool and helps prevent constipation. It also can help decrease pressure in the colon and help prevent flare-ups of diverticulitis.  High-fiber foods include:  Beans and legumes Bran, whole wheat bread and whole grain cereals such as oatmeal Brown and wild rice Fruits such as apples, bananas and pears Vegetables such as broccoli, carrots, corn and squash Whole wheat pasta If you currently don't have a diet high in fiber, you should add fiber gradually. This helps avoid bloating and abdominal discomfort. The target is to eat 25 to 30 grams of fiber daily. Drink at least 8 cups of fluid daily. Fluid will help soften your stool. Exercise also promotes bowel movement and helps prevent constipation.  When the colon is not inflamed, eat popcorn, nuts and seeds as tolerated.  Diet for Diverticulitis During flare ups of diverticulitis, follow a clear liquid diet. Your doctor will let you know when to progress from clear liquids to low fiber solids and then back to your normal diet.  A clear liquid diet means no solid foods. Juices should have no pulp. During the clear liquid  diet, you may consume:  Broth Clear juices such as apple, cranberry and grape. (Avoid orange juice) Jell-O Popsicles  When you're able to eat solid food, choose low fiber foods while healing. Low fiber foods include:  Canned or cooked fruit without seeds or skin, such as applesauce and melon Canned or well cooked vegetables without seeds and skin Dairy products such as cheese, milk and yogurt Eggs Low-fiber cereal Meat that is ground or tender and well cooked Pasta White bread and white rice AVOID RED MEAT WHILE YOU HAVE AN ACTIVE FLARE.  AVOID NSAIDS, IBUPROFEN, ALEVE, ASPIRIN WHILE YOU HAVE AN ACTIVE FLARE.  EXERCISE AS WELL AS SMOKING CESSATION CAN HELP PREVENT RECURRENCES.   After symptoms improve, usually within two to four days, you may add 5 to 15 grams of fiber a day back into your diet. Resume your high fiber diet when you no longer have symptoms.

## 2019-05-01 NOTE — Progress Notes (Signed)
[facility] History and Physical  Reason for Referral: Diverticulitis  Referring Physician: [facility]   Chief Complaint    New Patient (Initial Visit)      Anthony Vaughn is a 49 y.o. male.  HPI:  [name] is a 49 yo who is relatively healthy who has had multiple episodes of diverticulitis over the last 3 years. He says that he has had episodes of abdominal pain on a monthly basis and that he alters his diet to make his pain go away. He says that he does not get antibiotics monthly but has been on multiple rounds of antibiotics in the past. His worse episode was in November/ December and he was treated with oral antibiotics as an outpatient as [facility] had a high census.  He says that he had a colonoscopy 3 years ago during his first episode and found out about the diverticula and diverticulitis then. He just had a repeat with Dr. Gala Romney that demonstrated a chronically thickened/ inflamed sigmoid colon with concern for stricture. He says that he has regular daily BMs and has been feeling better overall.  He is reluctant to get surgery.   He denies any history of colitis per say or family history of colitis/ UC or Crohn's.  He says his mother had stomach problems but he does not know the specifics.   Past Medical History:  Diagnosis Date  . Arthritis   . Diverticulitis   . GERD (gastroesophageal reflux disease)   . HTN (hypertension)     Past Surgical History:  Procedure Laterality Date  . BIOPSY  03/04/2019   Procedure: BIOPSY;  Surgeon: Daneil Dolin, MD;  [facility];  Service: Endoscopy;;  gastric  . COLONOSCOPY    . COLONOSCOPY WITH PROPOFOL N/A 03/04/2019   Procedure: COLONOSCOPY WITH PROPOFOL;  Surgeon: Daneil Dolin, MD;  [facility];  Service: Endoscopy;  Laterality: N/A;  7:30am  . ESOPHAGOGASTRODUODENOSCOPY (EGD) WITH PROPOFOL N/A 03/04/2019   Procedure: ESOPHAGOGASTRODUODENOSCOPY (EGD) WITH  PROPOFOL;  Surgeon: Daneil Dolin, MD;  [facility];  Service: Endoscopy;  Laterality: N/A;  . NO PAST SURGERIES      Family History  Problem Relation Age of Onset  . Lung cancer Father   . Colon cancer Neg Hx   . Colon polyps Neg Hx     Social History   Tobacco Use  . Smoking status: Current Every Day Smoker    Packs/day: 0.50    Years: 25.00    Pack years: 12.50    Types: Cigarettes  . Smokeless tobacco: Never Used  Substance Use Topics  . Alcohol use: No  . Drug use: No    Medications: I have reviewed the patient's current medications. Allergies as of 05/01/2019      Reactions   Other    States he's allergic to a blood pressure pill that causes abdominal pain. He's not sure name of medication      Medication List       Accurate as of May 01, 2019  2:17 PM. If you have any questions, ask your nurse or doctor.        ALISKIREN-AMLODIPINE PO Take by mouth. Pt not sure of dosage.   BENEFIBER DRINK MIX PO Take by mouth. Takes 2 tablespoons daily.   carvedilol 12.5 MG tablet Commonly known as: COREG Take 12.5 mg by mouth 2 (two) times daily with a meal.   lisinopril 20 MG tablet Commonly known as:  ZESTRIL Take 1 tablet (20 mg total) by mouth daily.   pantoprazole 40 MG tablet Commonly known as: Protonix Take 1 tablet (40 mg total) by mouth 2 (two) times daily.        ROS:  A comprehensive review of systems was negative except for: Gastrointestinal: positive for abdominal pain, nausea and blood per rectum Neurological: positive for headaches  Blood pressure (!) 147/97, pulse 70, temperature 97.9 F (36.6 C), temperature source Oral, resp. rate 12, height 6' (1.829 m), weight 160 lb (72.6 kg), SpO2 98 %. Physical Exam Vitals reviewed.  Constitutional:      Appearance: He is normal weight.  HENT:     Head: Normocephalic and atraumatic.     Nose: Nose normal.     Mouth/Throat:     Mouth: Mucous membranes are moist.  Eyes:      Extraocular Movements: Extraocular movements intact.     Pupils: Pupils are equal, round, and reactive to light.  Cardiovascular:     Rate and Rhythm: Normal rate and regular rhythm.  Pulmonary:     Effort: Pulmonary effort is normal.     Breath sounds: Normal breath sounds.  Abdominal:     General: There is no distension.     Palpations: Abdomen is soft.     Tenderness: There is no abdominal tenderness.  Musculoskeletal:        General: No swelling. Normal range of motion.     Cervical back: Normal range of motion.  Skin:    General: Skin is warm and dry.  Neurological:     General: No focal deficit present.     Mental Status: He is alert and oriented to person, place, and time.  Psychiatric:        Mood and Affect: Mood normal.        Behavior: Behavior normal.        Thought Content: Thought content normal.        Judgment: Judgment normal.     Results: No imaging in our system- report from [facility] CT 01/2019- with long segment of sigmoid colon with thickening, concern for colitis versus diverticulitis   Colonoscopy 02/2019- Dr. Jena Gauss Area of thickened sigmoid colon with some evidence of chronic narrowing/ stricture; diverticula throughout the colon   Assessment & Plan:  Anthony Vaughn is a 49 y.o. male with a history of diverticulitis and what sounds like multiple episodes but not always requiring antibiotics. He has been hospitalized 1 time he says the first episode. He reports that he has had some abdominal pain and some blood per rectum at times, but nothing about the colonoscopy indicates concern for colitis/ IBD type findings.    We discussed possible surgery to remove the diseased part of the colon but that he has diverticula throughout his colon, and that we do not remove all the colon for this reason but he could be at risk for diverticulitis in other parts of the colon in the future. We discussed that right now he is reluctant to have surgery but that he may  ultimately require surgery especially given the concern for a stricture /narrowing of the sigmoid colon.   Discussed that we try to do these surgeries laparoscopically if possible and the normal post operative care and timeframe.   -Plan for CT with rectal contrast to evaluate colon now given that it has been 3-4 months since his prior and we do not have those images -Will discuss the options and continued recommendations after the  CT   All questions were answered to the satisfaction of the patient and family.     Lucretia Roers 05/01/2019, 2:17 PM

## 2019-05-05 ENCOUNTER — Telehealth: Payer: Self-pay

## 2019-05-05 NOTE — Telephone Encounter (Signed)
Tried reaching patient, unable to leave message.   CT A&P with rectal contrast- scheduled  05/16/19 @ 3:45 [facility] Vaughn [facility]. Need to pick up oral contras  No later than Thursday 05/15/19.  Patient verbalized understanding.

## 2019-05-16 ENCOUNTER — Ambulatory Visit (HOSPITAL_COMMUNITY): Admission: RE | Admit: 2019-05-16 | Payer: Self-pay | Source: Ambulatory Visit

## 2019-05-16 ENCOUNTER — Telehealth: Payer: Self-pay

## 2019-05-16 NOTE — Telephone Encounter (Signed)
Patient notified CT A7P 06/02/19 @ 8am Dr.Bridges 06/03/19 @ 9:30 am

## 2019-06-02 ENCOUNTER — Ambulatory Visit (HOSPITAL_COMMUNITY): Payer: Self-pay

## 2019-06-03 ENCOUNTER — Ambulatory Visit: Payer: Self-pay | Admitting: General Surgery

## 2019-06-05 ENCOUNTER — Other Ambulatory Visit: Payer: Self-pay

## 2019-06-05 ENCOUNTER — Ambulatory Visit (HOSPITAL_COMMUNITY)
Admission: RE | Admit: 2019-06-05 | Discharge: 2019-06-05 | Disposition: A | Discharge status: Home / Self Care | Payer: Medicaid Other | Source: Ambulatory Visit | Attending: General Surgery | Admitting: General Surgery

## 2019-06-05 DIAGNOSIS — K5732 Diverticulitis of large intestine without perforation or abscess without bleeding: Secondary | ICD-10-CM | POA: Diagnosis present

## 2019-06-05 LAB — POCT I-STAT CREATININE: Creatinine, Ser: 1.2 mg/dL (ref 0.61–1.24)

## 2019-06-05 MED ORDER — IOHEXOL 300 MG/ML  SOLN
100.0000 mL | Freq: Once | INTRAMUSCULAR | Status: AC | PRN
  Administered 2019-06-05: 100 mL via INTRAVENOUS

## 2019-06-06 ENCOUNTER — Telehealth: Payer: Self-pay | Admitting: General Surgery

## 2019-06-06 DIAGNOSIS — K5732 Diverticulitis of large intestine without perforation or abscess without bleeding: Secondary | ICD-10-CM

## 2019-06-06 NOTE — Telephone Encounter (Signed)
[facility]  Updated patient with results. Will see him on Tuesday. Given stricture and thickening, he will need surgery.  Algis Greenhouse, MD [facility] [address] [place], [place] [address] [phone] (office)    CLINICAL DATA:  Follow-up diverticulitis. Sigmoid colon stricture on recent colonoscopy.  EXAM: CT ABDOMEN AND PELVIS WITH CONTRAST  TECHNIQUE: Multidetector CT imaging of the abdomen and pelvis was performed using the standard protocol following bolus administration of intravenous contrast. Rectal contrast was also administered.  CONTRAST:  OMNIPAQUE IOHEXOL 300 MG/ML  SOLN  COMPARISON:  02/23/2019 from [facility]  FINDINGS: Lower Chest: No acute findings.  Hepatobiliary: No hepatic masses identified. Stable tiny sub-cm cyst in superior left hepatic lobe. Gallbladder is unremarkable. No evidence of biliary ductal dilatation.  Pancreas:  No mass or inflammatory changes.  Spleen: Within normal limits in size and appearance.  Adrenals/Urinary Tract: No masses identified. No evidence of ureteral calculi or hydronephrosis.  Stomach/Bowel: Sigmoid colon diverticulosis is again demonstrated. Rectal contrast shows the presence of short segment wall thickening and luminal narrowing in the proximal sigmoid colon, in area of diverticulosis. This appears similar to previous study, favoring chronic diverticulitis or muscular hypertrophy over carcinoma. No acute inflammatory process or abnormal fluid collections seen. No evidence of bowel obstruction.  Vascular/Lymphatic: No pathologically enlarged lymph nodes. No abdominal aortic aneurysm. Aortic atherosclerosis incidentally noted.  Reproductive:  No mass or other significant abnormality.  Other:  None.  Musculoskeletal:  No suspicious bone lesions identified.  IMPRESSION: 1. Sigmoid colon diverticulosis, with  short-segment wall thickening and luminal narrowing which appears similar to prior exam. This favors chronic diverticulitis/stricture or muscular hypertrophy over colon carcinoma. Recommend correlation with recent colonoscopy results. 2. No evidence of metastatic disease or other or other signal abnormality.  Aortic Atherosclerosis (ICD10-I70.0).   Electronically Signed   By: Danae Orleans M.D.   On: 06/05/2019 16:18

## 2019-06-10 ENCOUNTER — Other Ambulatory Visit: Payer: Self-pay

## 2019-06-10 ENCOUNTER — Encounter: Payer: Self-pay | Admitting: General Surgery

## 2019-06-10 ENCOUNTER — Ambulatory Visit (INDEPENDENT_AMBULATORY_CARE_PROVIDER_SITE_OTHER): Payer: Medicaid Other | Admitting: General Surgery

## 2019-06-10 VITALS — BP 152/108 | HR 88 | Temp 98.5°F | Resp 12 | Ht 72.0 in | Wt 161.0 lb

## 2019-06-10 DIAGNOSIS — K56699 Other intestinal obstruction unspecified as to partial versus complete obstruction: Secondary | ICD-10-CM

## 2019-06-10 DIAGNOSIS — K5732 Diverticulitis of large intestine without perforation or abscess without bleeding: Secondary | ICD-10-CM | POA: Diagnosis not present

## 2019-06-10 NOTE — Patient Instructions (Signed)
Colon Preparation:  Buy from the Store: Miralax bottle [phone]).  Gatorade 64 oz (not red). Dulcolax tablets.   The Day Prior to Surgery: Take 4 ducolax tablets at 7am with water. Drink plenty of clear liquids all day to avoid dehydration, no solid food.    Mix the bottle of Miralax and 64 oz of Gatorade and drink this mixture starting at 10am. Drink it gradually over the next few hours, 8 ounces every 15-30 minutes until it is gone. Finish this by 2pm.  Take 2 neomycin 500mg  tablets and 2 metronidazole 500mg  tablets at 2 pm. Take 2 neomycin 500mg  tablets and 2 metronidazole 500mg  tablets at 3pm. Take 2 neomycin 500mg  tablets and 2 metronidazole 500mg  tablets at 10pm.    Do not eat or drink anything after midnight the night before your surgery.  Do not eat or drink anything that morning, and take medications as instructed by the hospital staff on your preoperative visit.    Laparoscopic Partial Colectomy Laparoscopic colectomy is surgery to remove part or all of the large intestine (colon). This procedure may be used to treat several conditions, including:  Inflammation and infection of the colon (diverticulitis).  Tumors or masses in the colon.  Inflammatory bowel disease, such as Crohn disease or ulcerative colitis. Colectomy is an option when symptoms cannot be controlled with medicines.  Bleeding from the colon that cannot be controlled by another method.  Blockage or obstruction of the colon. Tell a health care provider about:  Any allergies you have.  All medicines you are taking, including vitamins, herbs, eye drops, creams, and over-the-counter medicines.  Any problems you or family members have had with anesthetic medicines.  Any blood disorders you have.  Any surgeries you have had.  Any medical conditions you have. What are the risks? Generally, this is a safe procedure. However, problems may occur, including:  Infection.  Bleeding.  Allergic reactions to  medicines or dyes.  Damage to other structures or organs.  Leaking from where the colon was sewn together.  Future blockage of the small intestines from scar tissue. Another surgery may be needed to repair this.  Needing to convert to an open procedure. Complications such as damage to other organs or excessive bleeding may require the surgeon to convert from a laparoscopic procedure to an open procedure. This involves making a larger incision in the abdomen. Medicines  Ask your health care provider about: ? Changing or stopping your regular medicines. This is especially important if you are taking diabetes medicines or blood thinners. ? Taking medicines such as aspirin and ibuprofen. These medicines can thin your blood. Do not take these medicines before your procedure if your health care provider instructs you not to.  You may be given antibiotic medicine to clean out bacteria from your colon. Follow the directions carefully and take the medicine at the correct time. General instructions  You may be prescribed an oral bowel prep to clean out your colon in preparation for the surgery: ? Follow instructions from your health care provider about how to do this. ? Do not eat or drink anything else after you have started the bowel prep, unless your health care provider tells you it is safe to do so.  Do not use any products that contain nicotine or tobacco, such as cigarettes and e-cigarettes. If you need help quitting, ask your health care provider. What happens during the procedure?  To reduce your risk of infection: ? Your health care team will wash or  sanitize their hands. ? Your skin will be washed with soap.  An IV tube will be inserted into one of your veins to deliver fluid and medication.  You will be given one of the following: ? A medicine to help you relax (sedative). ? A medicine to make you fall asleep (general anesthetic).  Small monitors will be connected to your body.  They will be used to check your heart, blood pressure, and oxygen level.  A breathing tube may be placed into your lungs during the procedure.  A thin, flexible tube (catheter) will be placed into your bladder to drain urine.  A tube may be placed through your nose and into your stomach to drain stomach fluids (nasogastric tube, or NG tube).  Your abdomen will be filled with air so it expands. This gives the surgeon more room to operate and makes your organs easier to see.  Several small cuts (incisions) will be made in your abdomen.  A thin, lighted tube with a tiny camera on the end (laparoscope) will be put through one of the small incisions. The camera on the laparoscope will send a picture to a computer screen in the operating room. This will give the surgeon a good view inside your abdomen.  Hollow tubes will be put through the other small incisions in your abdomen. The tools that are needed for the procedure will be put through these tubes.  Clamps or staples will be put on both ends of the diseased part of the colon.  The part of the intestine between the clamps or staples will be removed.  If possible, the ends of the healthy colon that remain will be stitched (sutured) or stapled together to allow your body to pass waste (stool).  Sometimes, the remaining colon cannot be stitched back together. If this is the case, a colostomy will be needed. If you need a colostomy: ? An opening to the outside of your body (stoma) will be made through your abdomen. ? The end of your colon will be brought to the opening. It will be stitched to the skin. ? A bag will be attached to the opening. Stool will drain into this removable bag. ? The colostomy may be temporary or permanent.  The incisions from the colectomy will be closed with sutures or staples. The procedure may vary among health care providers and hospitals. What happens after the procedure?  Your blood pressure, heart rate,  breathing rate, and blood oxygen level will be monitored until the medicines you were given have worn off.  You will receive fluids through an IV tube until your bowels start to work properly.  Once your bowels are working again, you will be given clear liquids first and then solid food as tolerated.  You will be given medicines to control your pain and nausea, if needed.  Do not drive for 24 hours if you were given a sedative. This information is not intended to replace advice given to you by your health care provider. Make sure you discuss any questions you have with your health care provider. Document Revised: 01/05/2017 Document Reviewed: 10/25/2015 Elsevier Patient Education  2020 [employer].

## 2019-06-10 NOTE — Progress Notes (Signed)
[facility] History and Physical  Reason for Referral: Sigmoid stricture   Chief Complaint    Follow-up      Anthony Vaughn is a 49 y.o. male.  HPI: Anthony Vaughn is a 48 yo that has a history of diverticulitis and multiple episodes in the last year with abdominal pain that comes on about monthly. He has been doing better clinically but we discussed repeat imaging the last time I saw him, and repeat CT demonstrates this continued strictured area seen on CT.  He currently is eating and drinking and having no abdominal pain. He has regular BMs.  He understands that this stricture will not improve and will continue to worsening and cause more narrowing with time.    Past Medical History:  Diagnosis Date  . Arthritis   . Diverticulitis   . GERD (gastroesophageal reflux disease)   . HTN (hypertension)     Past Surgical History:  Procedure Laterality Date  . BIOPSY  03/04/2019   Procedure: BIOPSY;  Surgeon: Daneil Dolin, MD;  Location: [facility];  Service: Endoscopy;;  gastric  . COLONOSCOPY    . COLONOSCOPY WITH PROPOFOL N/A 03/04/2019   Procedure: COLONOSCOPY WITH PROPOFOL;  Surgeon: Daneil Dolin, MD;  Location: [facility];  Service: Endoscopy;  Laterality: N/A;  7:30am  . ESOPHAGOGASTRODUODENOSCOPY (EGD) WITH PROPOFOL N/A 03/04/2019   Procedure: ESOPHAGOGASTRODUODENOSCOPY (EGD) WITH PROPOFOL;  Surgeon: Daneil Dolin, MD;  Location: [facility];  Service: Endoscopy;  Laterality: N/A;  . NO PAST SURGERIES      Family History  Problem Relation Age of Onset  . Lung cancer Father   . Colon cancer Neg Hx   . Colon polyps Neg Hx     Social History   Tobacco Use  . Smoking status: Current Every Day Smoker    Packs/day: 0.50    Years: 25.00    Pack years: 12.50    Types: Cigarettes  . Smokeless tobacco: Never Used  Substance Use Topics  . Alcohol use: No  . Drug use: No    Medications: I have reviewed the patient's current  medications. Allergies as of 06/10/2019      Reactions   Other    States he's allergic to a blood pressure pill that causes abdominal pain. He's not sure name of medication      Medication List       Accurate as of Jun 10, 2019 11:59 PM. If you have any questions, ask your nurse or doctor.        ALISKIREN-AMLODIPINE PO Take by mouth. Pt not sure of dosage.   BENEFIBER DRINK MIX PO Take by mouth. Takes 2 tablespoons daily.   carvedilol 12.5 MG tablet Commonly known as: COREG Take 12.5 mg by mouth 2 (two) times daily with a meal.   lisinopril 20 MG tablet Commonly known as: ZESTRIL Take 1 tablet (20 mg total) by mouth daily.   pantoprazole 40 MG tablet Commonly known as: Protonix Take 1 tablet (40 mg total) by mouth 2 (two) times daily.        ROS:  A comprehensive review of systems was negative except for: Gastrointestinal: positive for abdominal pain with episodes of diverticulitis   Blood pressure (!) 152/108, pulse 88, temperature 98.5 F (36.9 C), temperature source Oral, resp. rate 12, height 6' (1.829 m), weight 161 lb (73 kg), SpO2 98 %. Physical Exam Vitals reviewed.  Constitutional:      Appearance: He is normal weight.  HENT:  Head: Normocephalic and atraumatic.     Nose: Nose normal.     Mouth/Throat:     Mouth: Mucous membranes are moist.  Eyes:     Extraocular Movements: Extraocular movements intact.     Pupils: Pupils are equal, round, and reactive to light.  Cardiovascular:     Rate and Rhythm: Normal rate and regular rhythm.  Pulmonary:     Effort: Pulmonary effort is normal.     Breath sounds: Normal breath sounds.  Abdominal:     General: There is no distension.     Palpations: Abdomen is soft.     Tenderness: There is no abdominal tenderness.  Musculoskeletal:        General: No swelling. Normal range of motion.     Cervical back: Normal range of motion. No rigidity.  Skin:    General: Skin is warm.  Neurological:     General: No  focal deficit present.     Mental Status: He is alert and oriented to person, place, and time.  Psychiatric:        Mood and Affect: Mood normal.        Behavior: Behavior normal.        Thought Content: Thought content normal.        Judgment: Judgment normal.     Results: Personally reviewed and showed patient- thickened segment of colon  CLINICAL DATA:  Follow-up diverticulitis. Sigmoid colon stricture on recent colonoscopy.  EXAM: CT ABDOMEN AND PELVIS WITH CONTRAST  TECHNIQUE: Multidetector CT imaging of the abdomen and pelvis was performed using the standard protocol following bolus administration of intravenous contrast. Rectal contrast was also administered.  CONTRAST:  OMNIPAQUE IOHEXOL 300 MG/ML  SOLN  COMPARISON:  02/23/2019 from [facility]  FINDINGS: Lower Chest: No acute findings.  Hepatobiliary: No hepatic masses identified. Stable tiny sub-cm cyst in superior left hepatic lobe. Gallbladder is unremarkable. No evidence of biliary ductal dilatation.  Pancreas:  No mass or inflammatory changes.  Spleen: Within normal limits in size and appearance.  Adrenals/Urinary Tract: No masses identified. No evidence of ureteral calculi or hydronephrosis.  Stomach/Bowel: Sigmoid colon diverticulosis is again demonstrated. Rectal contrast shows the presence of short segment wall thickening and luminal narrowing in the proximal sigmoid colon, in area of diverticulosis. This appears similar to previous study, favoring chronic diverticulitis or muscular hypertrophy over carcinoma. No acute inflammatory process or abnormal fluid collections seen. No evidence of bowel obstruction.  Vascular/Lymphatic: No pathologically enlarged lymph nodes. No abdominal aortic aneurysm. Aortic atherosclerosis incidentally noted.  Reproductive:  No mass or other significant abnormality.  Other:  None.  Musculoskeletal:  No suspicious bone lesions  identified.  IMPRESSION: 1. Sigmoid colon diverticulosis, with short-segment wall thickening and luminal narrowing which appears similar to prior exam. This favors chronic diverticulitis/stricture or muscular hypertrophy over colon carcinoma. Recommend correlation with recent colonoscopy results. 2. No evidence of metastatic disease or other or other signal abnormality.  Aortic Atherosclerosis (ICD10-I70.0).   Electronically Signed   By: Danae Orleans M.D.   On: 06/05/2019 16:18   Assessment & Plan:  Anthony Vaughn is a 49 y.o. 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 Doing well overall but understands this is not going to resolve on its own. He is aware that surgery is in his future.   -Discussed laparoscopic partial colectomy and the risk of bleeding, infection, anastomotic leak, need for bowel preparation. Discussed need for potential blood and risk of allergic reactions and infections.  -  He is going to talk to his wife and figure out timing. He works [employer], and needs to be off 6-8 weeks to prevent hernia after surgery.  -Will do a phone call next week   Future Appointments  Date Time Provider Department Center  06/19/2019  3:50 PM Lucretia Roers, MD [facility] None  10/21/2019  9:30 AM Anice Paganini, NP [facility]    All questions were answered to the satisfaction of the patient.   Lucretia Roers 06/13/2019, 12:15 PM

## 2019-06-19 ENCOUNTER — Telehealth (INDEPENDENT_AMBULATORY_CARE_PROVIDER_SITE_OTHER): Payer: Self-pay | Admitting: General Surgery

## 2019-06-19 DIAGNOSIS — K5732 Diverticulitis of large intestine without perforation or abscess without bleeding: Secondary | ICD-10-CM

## 2019-06-19 DIAGNOSIS — K56699 Other intestinal obstruction unspecified as to partial versus complete obstruction: Secondary | ICD-10-CM

## 2019-06-20 ENCOUNTER — Encounter: Payer: Self-pay | Admitting: General Surgery

## 2019-06-20 MED ORDER — DOCUSATE SODIUM 100 MG PO CAPS: 100.0000 mg | ORAL_CAPSULE | Freq: Two times a day (BID) | ORAL | 1 refills | Status: DC

## 2019-06-20 MED ORDER — NEOMYCIN SULFATE 500 MG PO TABS: 1000.0000 mg | ORAL_TABLET | ORAL | 0 refills | Status: DC

## 2019-06-20 MED ORDER — METRONIDAZOLE 500 MG PO TABS: 1000.0000 mg | ORAL_TABLET | ORAL | 0 refills | Status: DC

## 2019-06-20 NOTE — Patient Instructions (Addendum)
Colon Preparation: Buy from the Store: Miralax bottle [phone]).  Gatorade 64 oz (not red). Dulcolax tablets 5mg .   The Day Prior to Surgery: Take 4 ducolax tablets at 7am with water. Drink plenty of clear liquids all day to avoid dehydration, no solid food.    Mix the bottle of Miralax and 64 oz of Gatorade and drink this mixture starting at 10am. Drink it gradually over the next few hours, 8 ounces every 15-30 minutes until it is gone. Finish this by 2pm.  Take 2 neomycin 500mg  tablets and 2 metronidazole 500mg  tablets at 2 pm. Take 2 neomycin 500mg  tablets and 2 metronidazole 500mg  tablets at 3pm. Take 2 neomycin 500mg  tablets and 2 metronidazole 500mg  tablets at 10pm.    Do not eat or drink anything after midnight the night before your surgery.  Do not eat or drink anything that morning, and take medications as instructed by the hospital staff on your preoperative visit.    Laparoscopic Partial Colectomy Laparoscopic colectomy is surgery to remove part or all of the large intestine (colon). This procedure may be used to treat several conditions, including:  Inflammation and infection of the colon (diverticulitis).  Tumors or masses in the colon.  Inflammatory bowel disease, such as Crohn disease or ulcerative colitis. Colectomy is an option when symptoms cannot be controlled with medicines.  Bleeding from the colon that cannot be controlled by another method.  Blockage or obstruction of the colon. Tell a health care provider about:  Any allergies you have.  All medicines you are taking, including vitamins, herbs, eye drops, creams, and over-the-counter medicines.  Any problems you or family members have had with anesthetic medicines.  Any blood disorders you have.  Any surgeries you have had.  Any medical conditions you have. What are the risks? Generally, this is a safe procedure. However, problems may occur, including:  Infection.  Bleeding.  Allergic reactions  to medicines or dyes.  Damage to other structures or organs.  Leaking from where the colon was sewn together.  Future blockage of the small intestines from scar tissue. Another surgery may be needed to repair this.  Needing to convert to an open procedure. Complications such as damage to other organs or excessive bleeding may require the surgeon to convert from a laparoscopic procedure to an open procedure. This involves making a larger incision in the abdomen. Medicines  Ask your health care provider about: ? Changing or stopping your regular medicines. This is especially important if you are taking diabetes medicines or blood thinners. ? Taking medicines such as aspirin and ibuprofen. These medicines can thin your blood. Do not take these medicines before your procedure if your health care provider instructs you not to.  You may be given antibiotic medicine to clean out bacteria from your colon. Follow the directions carefully and take the medicine at the correct time. General instructions  You may be prescribed an oral bowel prep to clean out your colon in preparation for the surgery: ? Follow instructions from your health care provider about how to do this. ? Do not eat or drink anything else after you have started the bowel prep, unless your health care provider tells you it is safe to do so.  Do not use any products that contain nicotine or tobacco, such as cigarettes and e-cigarettes. If you need help quitting, ask your health care provider. What happens during the procedure?  To reduce your risk of infection: ? Your health care team will wash or  sanitize their hands. ? Your skin will be washed with soap.  An IV tube will be inserted into one of your veins to deliver fluid and medication.  You will be given one of the following: ? A medicine to help you relax (sedative). ? A medicine to make you fall asleep (general anesthetic).  Small monitors will be connected to your  body. They will be used to check your heart, blood pressure, and oxygen level.  A breathing tube may be placed into your lungs during the procedure.  A thin, flexible tube (catheter) will be placed into your bladder to drain urine.  A tube may be placed through your nose and into your stomach to drain stomach fluids (nasogastric tube, or NG tube).  Your abdomen will be filled with air so it expands. This gives the surgeon more room to operate and makes your organs easier to see.  Several small cuts (incisions) will be made in your abdomen.  A thin, lighted tube with a tiny camera on the end (laparoscope) will be put through one of the small incisions. The camera on the laparoscope will send a picture to a computer screen in the operating room. This will give the surgeon a good view inside your abdomen.  Hollow tubes will be put through the other small incisions in your abdomen. The tools that are needed for the procedure will be put through these tubes.  Clamps or staples will be put on both ends of the diseased part of the colon.  The part of the intestine between the clamps or staples will be removed.  If possible, the ends of the healthy colon that remain will be stitched (sutured) or stapled together to allow your body to pass waste (stool).  Sometimes, the remaining colon cannot be stitched back together. If this is the case, a colostomy will be needed. If you need a colostomy: ? An opening to the outside of your body (stoma) will be made through your abdomen. ? The end of your colon will be brought to the opening. It will be stitched to the skin. ? A bag will be attached to the opening. Stool will drain into this removable bag. ? The colostomy may be temporary or permanent.  The incisions from the colectomy will be closed with sutures or staples. The procedure may vary among health care providers and hospitals. What happens after the procedure?  Your blood pressure, heart rate,  breathing rate, and blood oxygen level will be monitored until the medicines you were given have worn off.  You will receive fluids through an IV tube until your bowels start to work properly.  Once your bowels are working again, you will be given clear liquids first and then solid food as tolerated.  You will be given medicines to control your pain and nausea, if needed.  Do not drive for 24 hours if you were given a sedative. This information is not intended to replace advice given to you by your health care provider. Make sure you discuss any questions you have with your health care provider. Document Revised: 01/05/2017 Document Reviewed: 10/25/2015 Elsevier Patient Education  2020 [employer].

## 2019-06-21 NOTE — H&P (View-Only) (Signed)
[facility]  Patient came by office and we discussed surgery and options. Plan for laparoscopic partial colectomy in a few weeks. We had previously discussed the risk of surgery and the hospital course.   Bowel preparation given. Told him to use colace for now as he says he is having some issues with constipation.   Algis Greenhouse, MD [facility] [address] [place], [place] [address] [phone] (office)

## 2019-06-21 NOTE — Progress Notes (Signed)
[facility]  Patient came by office and we discussed surgery and options. Plan for laparoscopic partial colectomy in a few weeks. We had previously discussed the risk of surgery and the hospital course.   Bowel preparation given. Told him to use colace for now as he says he is having some issues with constipation.   Anthony Delair, MD [facility] [address] [place], [place] [address] [phone] (office)     

## 2019-07-09 NOTE — Patient Instructions (Signed)
Your procedure is scheduled on: 07/14/2019  Report to [facility] at   6:15  AM.  Call this number if you have problems the morning of surgery: [phone]   Remember:   Do not Eat or Drink after midnight         No Smoking the morning of surgery  :  Take these medicines the morning of surgery with A SIP OF WATER:Amlodipine, Coreg, and pantoprazole   Follow colon prep instructions given by Dr Henreitta Leber- see attached   Do not wear jewelry, make-up or nail polish.  Do not wear lotions, powders, or perfumes. You may wear deodorant.  Do not shave 48 hours prior to surgery. Men may shave face and neck.  Do not bring valuables to the hospital.  Contacts, dentures or bridgework may not be worn into surgery.  Leave suitcase in the car. After surgery it may be brought to your room.  For patients admitted to the hospital, checkout time is 11:00 AM the day of discharge.   Patients discharged the day of surgery will not be allowed to drive home.    Special Instructions: Shower using CHG night before surgery and shower the day of surgery use CHG.  Use special wash - you have one bottle of CHG for all showers.  You should use approximately 1/2 of the bottle for each shower.  Laparoscopic Colectomy Laparoscopic colectomy is surgery to remove part or all of the large intestine (colon). This procedure may be used to treat several conditions, including:  Inflammation and infection of the colon (diverticulitis).  Tumors or masses in the colon.  Inflammatory bowel disease, such as Crohn disease or ulcerative colitis. Colectomy is an option when symptoms cannot be controlled with medicines.  Bleeding from the colon that cannot be controlled by another method.  Blockage or obstruction of the colon. Tell a health care provider about:  Any allergies you have.  All medicines you are taking, including vitamins, herbs, eye drops, creams, and over-the-counter medicines.  Any problems you or family  members have had with anesthetic medicines.  Any blood disorders you have.  Any surgeries you have had.  Any medical conditions you have. What are the risks? Generally, this is a safe procedure. However, problems may occur, including:  Infection.  Bleeding.  Allergic reactions to medicines or dyes.  Damage to other structures or organs.  Leaking from where the colon was sewn together.  Future blockage of the small intestines from scar tissue. Another surgery may be needed to repair this.  Needing to convert to an open procedure. Complications such as damage to other organs or excessive bleeding may require the surgeon to convert from a laparoscopic procedure to an open procedure. This involves making a larger incision in the abdomen. What happens before the procedure? Staying hydrated Follow instructions from your health care provider about hydration, which may include:  Up to 2 hours before the procedure - you may continue to drink clear liquids, such as water, clear fruit juice, black coffee, and plain tea. Eating and drinking restrictions Follow instructions from your health care provider about eating and drinking, which may include:  8 hours before the procedure - stop eating heavy meals, meals with high fiber, or foods such as meat, fried foods, or fatty foods.  6 hours before the procedure - stop eating light meals or foods, such as toast or cereal.  6 hours before the procedure - stop drinking milk or drinks that contain milk.  2 hours  before the procedure - stop drinking clear liquids. Medicines  Ask your health care provider about: ? Changing or stopping your regular medicines. This is especially important if you are taking diabetes medicines or blood thinners. ? Taking medicines such as aspirin and ibuprofen. These medicines can thin your blood. Do not take these medicines before your procedure if your health care provider instructs you not to.  You may be given  antibiotic medicine to clean out bacteria from your colon. Follow the directions carefully and take the medicine at the correct time. General instructions  You may be prescribed an oral bowel prep to clean out your colon in preparation for the surgery: ? Follow instructions from your health care provider about how to do this. ? Do not eat or drink anything else after you have started the bowel prep, unless your health care provider tells you it is safe to do so.  Do not use any products that contain nicotine or tobacco, such as cigarettes and e-cigarettes. If you need help quitting, ask your health care provider. What happens during the procedure?  To reduce your risk of infection: ? Your health care team will wash or sanitize their hands. ? Your skin will be washed with soap.  An IV tube will be inserted into one of your veins to deliver fluid and medication.  You will be given one of the following: ? A medicine to help you relax (sedative). ? A medicine to make you fall asleep (general anesthetic).  Small monitors will be connected to your body. They will be used to check your heart, blood pressure, and oxygen level.  A breathing tube may be placed into your lungs during the procedure.  A thin, flexible tube (catheter) will be placed into your bladder to drain urine.  A tube may be placed through your nose and into your stomach to drain stomach fluids (nasogastric tube, or NG tube).  Your abdomen will be filled with air so it expands. This gives the surgeon more room to operate and makes your organs easier to see.  Several small cuts (incisions) will be made in your abdomen.  A thin, lighted tube with a tiny camera on the end (laparoscope) will be put through one of the small incisions. The camera on the laparoscope will send a picture to a computer screen in the operating room. This will give the surgeon a good view inside your abdomen.  Hollow tubes will be put through the other  small incisions in your abdomen. The tools that are needed for the procedure will be put through these tubes.  Clamps or staples will be put on both ends of the diseased part of the colon.  The part of the intestine between the clamps or staples will be removed.  If possible, the ends of the healthy colon that remain will be stitched (sutured) or stapled together to allow your body to pass waste (stool).  Sometimes, the remaining colon cannot be stitched back together. If this is the case, a colostomy will be needed. If you need a colostomy: ? An opening to the outside of your body (stoma) will be made through your abdomen. ? The end of your colon will be brought to the opening. It will be stitched to the skin. ? A bag will be attached to the opening. Stool will drain into this removable bag. ? The colostomy may be temporary or permanent.  The incisions from the colectomy will be closed with sutures or staples. The  procedure may vary among health care providers and hospitals. What happens after the procedure?  Your blood pressure, heart rate, breathing rate, and blood oxygen level will be monitored until the medicines you were given have worn off.  You will receive fluids through an IV tube until your bowels start to work properly.  Once your bowels are working again, you will be given clear liquids first and then solid food as tolerated.  You will be given medicines to control your pain and nausea, if needed.  Do not drive for 24 hours if you were given a sedative. This information is not intended to replace advice given to you by your health care provider. Make sure you discuss any questions you have with your health care provider. Document Revised: 01/05/2017 Document Reviewed: 10/25/2015 Elsevier Patient Education  2020 [employer]  Laparoscopic Colectomy, Care After This sheet gives you information about how to care for yourself after your procedure. Your health care provider  may also give you more specific instructions. If you have problems or questions, contact your health care provider. What can I expect after the procedure? After your procedure, it is common to have the following:  Pain in your abdomen, especially in the incision areas. You will be given medicine to control the pain.  Tiredness. This is a normal part of the recovery process. Your energy level will return to normal over the next several weeks.  Changes in your bowel movements, such as constipation or needing to go more often. Talk with your health care provider about how to manage this. Follow these instructions at home: Medicines  Take over-the-counter and prescription medicines only as told by your health care provider.  Do not drive or use heavy machinery while taking prescription pain medicine.  Do not drink alcohol while taking prescription pain medicine.  If you were prescribed an antibiotic medicine, use it as told by your health care provider. Do not stop using the antibiotic even if you start to feel better. Incision care   Follow instructions from your health care provider about how to take care of your incision areas. Make sure you: ? Keep your incisions clean and dry. ? Wash your hands with soap and water before and after applying medicine to the areas, and before and after changing your bandage (dressing). If soap and water are not available, use hand sanitizer. ? Change your dressing as told by your health care provider. ? Leave stitches (sutures), skin glue, or adhesive strips in place. These skin closures may need to stay in place for 2 weeks or longer. If adhesive strip edges start to loosen and curl up, you may trim the loose edges. Do not remove adhesive strips completely unless your health care provider tells you to do that.  Do not wear tight clothing over the incisions. Tight clothing may rub and irritate the incision areas, which may cause the incisions to open.  Do  not take baths, swim, or use a hot tub until your health care provider approves. Ask your health care provider if you can take showers. You may only be allowed to take sponge baths for bathing.  Check your incision area every day for signs of infection. Check for: ? More redness, swelling, or pain. ? More fluid or blood. ? Warmth. ? Pus or a bad smell. Activity  Avoid lifting anything that is heavier than 10 lb (4.5 kg) for 2 weeks or until your health care provider says it is okay.  You may  resume normal activities as told by your health care provider. Ask your health care provider what activities are safe for you.  Take rest breaks during the day as needed. Eating and drinking  Follow instructions from your health care provider about what you can eat after surgery.  To prevent or treat constipation while you are taking prescription pain medicine, your health care provider may recommend that you: ? Drink enough fluid to keep your urine clear or pale yellow. ? Take over-the-counter or prescription medicines. ? Eat foods that are high in fiber, such as fresh fruits and vegetables, whole grains, and beans. ? Limit foods that are high in fat and processed sugars, such as fried and sweet foods. General instructions  Ask your health care provider when you will need an appointment to get your sutures or staples removed.  Keep all follow-up visits as told by your health care provider. This is important. Contact a health care provider if:  You have more redness, swelling, or pain around your incisions.  You have more fluid or blood coming from the incisions.  Your incisions feel warm to the touch.  You have pus or a bad smell coming from your incisions or your dressing.  You have a fever.  You have an incision that breaks open (edges not staying together) after sutures or staples have been removed. Get help right away if:  You develop a rash.  You have chest pain or difficulty  breathing.  You have pain or swelling in your legs.  You feel light-headed or you faint.  Your abdomen swells (becomes distended).  You have nausea or vomiting.  You have blood in your stool (feces). This information is not intended to replace advice given to you by your health care provider. Make sure you discuss any questions you have with your health care provider. Document Revised: 10/12/2017 Document Reviewed: 10/25/2015 Elsevier Patient Education  2020 [employer]  General Anesthesia, Adult, Care After This sheet gives you information about how to care for yourself after your procedure. Your health care provider may also give you more specific instructions. If you have problems or questions, contact your health care provider. What can I expect after the procedure? After the procedure, the following side effects are common:  Pain or discomfort at the IV site.  Nausea.  Vomiting.  Sore throat.  Trouble concentrating.  Feeling cold or chills.  Weak or tired.  Sleepiness and fatigue.  Soreness and body aches. These side effects can affect parts of the body that were not involved in surgery. Follow these instructions at home:  For at least 24 hours after the procedure:  Have a responsible adult stay with you. It is important to have someone help care for you until you are awake and alert.  Rest as needed.  Do not: ? Participate in activities in which you could fall or become injured. ? Drive. ? Use heavy machinery. ? Drink alcohol. ? Take sleeping pills or medicines that cause drowsiness. ? Make important decisions or sign legal documents. ? Take care of children on your own. Eating and drinking  Follow any instructions from your health care provider about eating or drinking restrictions.  When you feel hungry, start by eating small amounts of foods that are soft and easy to digest (bland), such as toast. Gradually return to your regular diet.  Drink  enough fluid to keep your urine pale yellow.  If you vomit, rehydrate by drinking water, juice, or clear broth. General  instructions  If you have sleep apnea, surgery and certain medicines can increase your risk for breathing problems. Follow instructions from your health care provider about wearing your sleep device: ? Anytime you are sleeping, including during daytime naps. ? While taking prescription pain medicines, sleeping medicines, or medicines that make you drowsy.  Return to your normal activities as told by your health care provider. Ask your health care provider what activities are safe for you.  Take over-the-counter and prescription medicines only as told by your health care provider.  If you smoke, do not smoke without supervision.  Keep all follow-up visits as told by your health care provider. This is important. Contact a health care provider if:  You have nausea or vomiting that does not get better with medicine.  You cannot eat or drink without vomiting.  You have pain that does not get better with medicine.  You are unable to pass urine.  You develop a skin rash.  You have a fever.  You have redness around your IV site that gets worse. Get help right away if:  You have difficulty breathing.  You have chest pain.  You have blood in your urine or stool, or you vomit blood. Summary  After the procedure, it is common to have a sore throat or nausea. It is also common to feel tired.  Have a responsible adult stay with you for the first 24 hours after general anesthesia. It is important to have someone help care for you until you are awake and alert.  When you feel hungry, start by eating small amounts of foods that are soft and easy to digest (bland), such as toast. Gradually return to your regular diet.  Drink enough fluid to keep your urine pale yellow.  Return to your normal activities as told by your health care provider. Ask your health care provider  what activities are safe for you. This information is not intended to replace advice given to you by your health care provider. Make sure you discuss any questions you have with your health care provider. Document Revised: 01/26/2017 Document Reviewed: 09/08/2016 Elsevier Patient Education  [place].

## 2019-07-11 ENCOUNTER — Encounter (HOSPITAL_COMMUNITY)
Admission: RE | Admit: 2019-07-11 | Discharge: 2019-07-11 | Disposition: A | Discharge status: Home / Self Care | Payer: Medicaid Other | Source: Ambulatory Visit | Attending: General Surgery | Admitting: General Surgery

## 2019-07-11 ENCOUNTER — Other Ambulatory Visit: Payer: Self-pay

## 2019-07-11 ENCOUNTER — Other Ambulatory Visit (HOSPITAL_COMMUNITY)
Admission: RE | Admit: 2019-07-11 | Discharge: 2019-07-11 | Disposition: A | Discharge status: Home / Self Care | Payer: Medicaid Other | Source: Ambulatory Visit | Attending: General Surgery | Admitting: General Surgery

## 2019-07-11 ENCOUNTER — Encounter (HOSPITAL_COMMUNITY): Payer: Self-pay

## 2019-07-11 DIAGNOSIS — Z01818 Encounter for other preprocedural examination: Secondary | ICD-10-CM | POA: Diagnosis not present

## 2019-07-11 DIAGNOSIS — Z20822 Contact with and (suspected) exposure to covid-19: Secondary | ICD-10-CM | POA: Diagnosis not present

## 2019-07-11 LAB — BASIC METABOLIC PANEL
Anion gap: 11 (ref 5–15)
BUN: 15 mg/dL (ref 6–20)
CO2: 21 mmol/L — ABNORMAL LOW (ref 22–32)
Calcium: 9.7 mg/dL (ref 8.9–10.3)
Chloride: 107 mmol/L (ref 98–111)
Creatinine, Ser: 1 mg/dL (ref 0.61–1.24)
GFR calc Af Amer: >60 mL/min (ref >60)
GFR calc non Af Amer: >60 mL/min (ref >60)
Glucose, Bld: 92 mg/dL (ref 70–99)
Potassium: 4 mmol/L (ref 3.5–5.1)
Sodium: 139 mmol/L (ref 135–145)

## 2019-07-11 LAB — CBC WITH DIFFERENTIAL/PLATELET
Abs Immature Granulocytes: 0 10*3/uL (ref 0.00–0.07)
Basophils Absolute: 0 10*3/uL (ref 0.0–0.1)
Basophils Relative: 1 %
Eosinophils Absolute: 0 10*3/uL (ref 0.0–0.5)
Eosinophils Relative: 1 %
HCT: 40.1 % (ref 39.0–52.0)
Hemoglobin: 13.2 g/dL (ref 13.0–17.0)
Immature Granulocytes: 0 %
Lymphocytes Relative: 46 %
Lymphs Abs: 2.1 10*3/uL (ref 0.7–4.0)
MCH: 30.8 pg (ref 26.0–34.0)
MCHC: 32.9 g/dL (ref 30.0–36.0)
MCV: 93.7 fL (ref 80.0–100.0)
Monocytes Absolute: 0.4 10*3/uL (ref 0.1–1.0)
Monocytes Relative: 8 %
Neutro Abs: 2 10*3/uL (ref 1.7–7.7)
Neutrophils Relative %: 44 %
Platelets: 271 10*3/uL (ref 150–400)
RBC: 4.28 MIL/uL (ref 4.22–5.81)
RDW: 12.9 % (ref 11.5–15.5)
WBC: 4.5 10*3/uL (ref 4.0–10.5)
nRBC: 0 % (ref 0.0–0.2)

## 2019-07-11 LAB — TYPE AND SCREEN
ABO/RH(D): O POS
Antibody Screen: NEGATIVE

## 2019-07-11 LAB — SARS CORONAVIRUS 2 (TAT 6-24 HRS): SARS Coronavirus 2: NEGATIVE

## 2019-07-11 NOTE — Pre-Procedure Instructions (Signed)
Dr Henreitta Leber also aware of BP and consultation with Dr Alva Garnet.

## 2019-07-11 NOTE — Pre-Procedure Instructions (Signed)
Dr Alva Garnet spoke at length with patient about exercise tolerance. Patient played basketball up until 6 months ago when diverticulosis flared so badly.

## 2019-07-11 NOTE — Pre-Procedure Instructions (Signed)
Dr Alva Garnet in to speak with patient about heart history and EKG. He is okay to proceed at this time.

## 2019-07-14 ENCOUNTER — Encounter (HOSPITAL_COMMUNITY)
Admission: RE | Disposition: A | Discharge status: Home / Self Care | Payer: Self-pay | Source: Home / Self Care | Attending: Family Medicine

## 2019-07-14 ENCOUNTER — Inpatient Hospital Stay (HOSPITAL_COMMUNITY): Payer: Medicaid Other | Admitting: Anesthesiology

## 2019-07-14 ENCOUNTER — Inpatient Hospital Stay (HOSPITAL_COMMUNITY)
Admission: RE | Admit: 2019-07-14 | Discharge: 2019-07-17 | DRG: 330 | Disposition: A | Discharge status: Home / Self Care | Payer: Medicaid Other | Attending: Family Medicine | Admitting: Family Medicine

## 2019-07-14 DIAGNOSIS — I5042 Chronic combined systolic (congestive) and diastolic (congestive) heart failure: Secondary | ICD-10-CM | POA: Diagnosis present

## 2019-07-14 DIAGNOSIS — K219 Gastro-esophageal reflux disease without esophagitis: Secondary | ICD-10-CM | POA: Diagnosis present

## 2019-07-14 DIAGNOSIS — F17210 Nicotine dependence, cigarettes, uncomplicated: Secondary | ICD-10-CM | POA: Diagnosis present

## 2019-07-14 DIAGNOSIS — Z9049 Acquired absence of other specified parts of digestive tract: Secondary | ICD-10-CM

## 2019-07-14 DIAGNOSIS — K5732 Diverticulitis of large intestine without perforation or abscess without bleeding: Secondary | ICD-10-CM | POA: Diagnosis present

## 2019-07-14 DIAGNOSIS — I110 Hypertensive heart disease with heart failure: Secondary | ICD-10-CM | POA: Diagnosis present

## 2019-07-14 DIAGNOSIS — I5022 Chronic systolic (congestive) heart failure: Secondary | ICD-10-CM | POA: Diagnosis present

## 2019-07-14 DIAGNOSIS — D62 Acute posthemorrhagic anemia: Secondary | ICD-10-CM | POA: Diagnosis not present

## 2019-07-14 DIAGNOSIS — I959 Hypotension, unspecified: Secondary | ICD-10-CM | POA: Diagnosis not present

## 2019-07-14 DIAGNOSIS — I10 Essential (primary) hypertension: Secondary | ICD-10-CM | POA: Diagnosis present

## 2019-07-14 DIAGNOSIS — Z20822 Contact with and (suspected) exposure to covid-19: Secondary | ICD-10-CM | POA: Diagnosis present

## 2019-07-14 DIAGNOSIS — I160 Hypertensive urgency: Secondary | ICD-10-CM | POA: Diagnosis present

## 2019-07-14 DIAGNOSIS — Z79899 Other long term (current) drug therapy: Secondary | ICD-10-CM | POA: Diagnosis not present

## 2019-07-14 DIAGNOSIS — K56699 Other intestinal obstruction unspecified as to partial versus complete obstruction: Secondary | ICD-10-CM | POA: Diagnosis present

## 2019-07-14 DIAGNOSIS — I340 Nonrheumatic mitral (valve) insufficiency: Secondary | ICD-10-CM | POA: Diagnosis not present

## 2019-07-14 DIAGNOSIS — M1990 Unspecified osteoarthritis, unspecified site: Secondary | ICD-10-CM | POA: Diagnosis present

## 2019-07-14 DIAGNOSIS — I361 Nonrheumatic tricuspid (valve) insufficiency: Secondary | ICD-10-CM | POA: Diagnosis not present

## 2019-07-14 DIAGNOSIS — Z720 Tobacco use: Secondary | ICD-10-CM | POA: Diagnosis present

## 2019-07-14 HISTORY — PX: LAPAROSCOPIC PARTIAL COLECTOMY: SHX5907

## 2019-07-14 LAB — TROPONIN I (HIGH SENSITIVITY)
Troponin I (High Sensitivity): 18 ng/L — ABNORMAL HIGH (ref <18)
Troponin I (High Sensitivity): 20 ng/L — ABNORMAL HIGH (ref <18)

## 2019-07-14 LAB — GLUCOSE, CAPILLARY: Glucose-Capillary: 80 mg/dL (ref 70–99)

## 2019-07-14 SURGERY — LAPAROSCOPIC PARTIAL COLECTOMY
Anesthesia: General | Site: Abdomen

## 2019-07-14 MED ORDER — ESMOLOL HCL 100 MG/10ML IV SOLN
INTRAVENOUS | Status: AC
  Filled 2019-07-14: qty 10

## 2019-07-14 MED ORDER — FENTANYL CITRATE (PF) 100 MCG/2ML IJ SOLN
INTRAMUSCULAR | Status: AC
  Filled 2019-07-14: qty 2

## 2019-07-14 MED ORDER — ENOXAPARIN SODIUM 40 MG/0.4ML ~~LOC~~ SOLN
40.0000 mg | SUBCUTANEOUS | Status: DC
  Administered 2019-07-15 – 2019-07-16 (×2): 40 mg via SUBCUTANEOUS
  Filled 2019-07-14 (×3): qty 0.4

## 2019-07-14 MED ORDER — SODIUM CHLORIDE FLUSH 0.9 % IV SOLN
INTRAVENOUS | Status: AC
  Filled 2019-07-14: qty 10

## 2019-07-14 MED ORDER — ORAL CARE MOUTH RINSE: 15.0000 mL | Freq: Once | OROMUCOSAL | Status: AC

## 2019-07-14 MED ORDER — SODIUM CHLORIDE (PF) 0.9 % IJ SOLN
INTRAMUSCULAR | Status: AC
  Filled 2019-07-14: qty 10

## 2019-07-14 MED ORDER — LIDOCAINE 2% (20 MG/ML) 5 ML SYRINGE
INTRAMUSCULAR | Status: AC
  Filled 2019-07-14: qty 5

## 2019-07-14 MED ORDER — LIDOCAINE HCL (CARDIAC) PF 100 MG/5ML IV SOSY
PREFILLED_SYRINGE | INTRAVENOUS | Status: DC | PRN
Start: 2019-07-14 — End: 2019-07-14
  Administered 2019-07-14: 100 mg via INTRAVENOUS

## 2019-07-14 MED ORDER — BUPIVACAINE LIPOSOME 1.3 % IJ SUSP
INTRAMUSCULAR | Status: DC | PRN
  Administered 2019-07-14: 20 mL

## 2019-07-14 MED ORDER — GABAPENTIN 300 MG PO CAPS
300.0000 mg | ORAL_CAPSULE | Freq: Two times a day (BID) | ORAL | Status: DC
  Administered 2019-07-14 – 2019-07-17 (×7): 300 mg via ORAL
  Filled 2019-07-14 (×7): qty 1

## 2019-07-14 MED ORDER — HYDROMORPHONE HCL 1 MG/ML IJ SOLN
0.2500 mg | INTRAMUSCULAR | Status: DC | PRN
  Administered 2019-07-14 (×2): 0.5 mg via INTRAVENOUS
  Filled 2019-07-14 (×3): qty 0.5

## 2019-07-14 MED ORDER — ONDANSETRON HCL 4 MG PO TABS
4.0000 mg | ORAL_TABLET | Freq: Four times a day (QID) | ORAL | Status: DC | PRN
  Administered 2019-07-16: 4 mg via ORAL
  Filled 2019-07-14: qty 1

## 2019-07-14 MED ORDER — DIPHENHYDRAMINE HCL 50 MG/ML IJ SOLN: 12.5000 mg | Freq: Four times a day (QID) | INTRAMUSCULAR | Status: DC | PRN

## 2019-07-14 MED ORDER — ONDANSETRON HCL 4 MG/2ML IJ SOLN
INTRAMUSCULAR | Status: AC
  Filled 2019-07-14: qty 2

## 2019-07-14 MED ORDER — LACTATED RINGERS IV SOLN: INTRAVENOUS | Status: DC

## 2019-07-14 MED ORDER — DIPHENHYDRAMINE HCL 12.5 MG/5ML PO ELIX: 12.5000 mg | ORAL_SOLUTION | Freq: Four times a day (QID) | ORAL | Status: DC | PRN

## 2019-07-14 MED ORDER — ACETAMINOPHEN 500 MG PO TABS
1000.0000 mg | ORAL_TABLET | Freq: Four times a day (QID) | ORAL | Status: DC
  Administered 2019-07-14 – 2019-07-17 (×9): 1000 mg via ORAL
  Filled 2019-07-14 (×10): qty 2

## 2019-07-14 MED ORDER — PROPOFOL 10 MG/ML IV BOLUS
INTRAVENOUS | Status: AC
  Filled 2019-07-14: qty 20

## 2019-07-14 MED ORDER — 0.9 % SODIUM CHLORIDE (POUR BTL) OPTIME
TOPICAL | Status: DC | PRN
  Administered 2019-07-14: 2000 mL
  Administered 2019-07-14: 1000 mL

## 2019-07-14 MED ORDER — ALVIMOPAN 12 MG PO CAPS
12.0000 mg | ORAL_CAPSULE | Freq: Two times a day (BID) | ORAL | Status: DC
  Administered 2019-07-15: 12 mg via ORAL
  Filled 2019-07-14: qty 1

## 2019-07-14 MED ORDER — NICARDIPINE HCL IN NACL 20-0.86 MG/200ML-% IV SOLN
3.0000 mg/h | INTRAVENOUS | Status: DC
  Administered 2019-07-14 – 2019-07-15 (×2): 5 mg/h via INTRAVENOUS
  Filled 2019-07-14 (×3): qty 200

## 2019-07-14 MED ORDER — METOPROLOL TARTRATE 5 MG/5ML IV SOLN: 5.0000 mg | Freq: Four times a day (QID) | INTRAVENOUS | Status: DC

## 2019-07-14 MED ORDER — KETOROLAC TROMETHAMINE 30 MG/ML IJ SOLN
30.0000 mg | Freq: Three times a day (TID) | INTRAMUSCULAR | Status: DC
  Administered 2019-07-14 – 2019-07-16 (×7): 30 mg via INTRAVENOUS
  Filled 2019-07-14 (×7): qty 1

## 2019-07-14 MED ORDER — VECURONIUM BROMIDE 10 MG IV SOLR
INTRAVENOUS | Status: DC | PRN
Start: 2019-07-14 — End: 2019-07-14
  Administered 2019-07-14: 1 mg via INTRAVENOUS

## 2019-07-14 MED ORDER — CHLORHEXIDINE GLUCONATE 0.12 % MT SOLN
OROMUCOSAL | Status: AC
  Filled 2019-07-14: qty 15

## 2019-07-14 MED ORDER — MORPHINE SULFATE (PF) 2 MG/ML IV SOLN
2.0000 mg | INTRAVENOUS | Status: DC | PRN
  Administered 2019-07-14: 2 mg via INTRAVENOUS
  Filled 2019-07-14: qty 1

## 2019-07-14 MED ORDER — PROMETHAZINE HCL 25 MG/ML IJ SOLN
6.2500 mg | INTRAMUSCULAR | Status: DC | PRN
  Filled 2019-07-14: qty 1

## 2019-07-14 MED ORDER — VECURONIUM BROMIDE 10 MG IV SOLR
INTRAVENOUS | Status: AC
  Filled 2019-07-14: qty 10

## 2019-07-14 MED ORDER — LABETALOL HCL 5 MG/ML IV SOLN
10.0000 mg | INTRAVENOUS | Status: DC | PRN
  Administered 2019-07-14: 5 mg via INTRAVENOUS

## 2019-07-14 MED ORDER — ROCURONIUM BROMIDE 10 MG/ML (PF) SYRINGE
PREFILLED_SYRINGE | INTRAVENOUS | Status: DC | PRN
  Administered 2019-07-14: 70 mg via INTRAVENOUS
  Administered 2019-07-14 (×3): 10 mg via INTRAVENOUS

## 2019-07-14 MED ORDER — SODIUM CHLORIDE 0.9 % IV SOLN
2.0000 g | Freq: Two times a day (BID) | INTRAVENOUS | Status: AC
  Administered 2019-07-14 – 2019-07-15 (×2): 2 g via INTRAVENOUS
  Filled 2019-07-14 (×2): qty 2

## 2019-07-14 MED ORDER — CHLORHEXIDINE GLUCONATE CLOTH 2 % EX PADS: 6.0000 | MEDICATED_PAD | Freq: Once | CUTANEOUS | Status: DC

## 2019-07-14 MED ORDER — EPHEDRINE 5 MG/ML INJ
INTRAVENOUS | Status: AC
  Filled 2019-07-14: qty 10

## 2019-07-14 MED ORDER — ZOLPIDEM TARTRATE 5 MG PO TABS
5.0000 mg | ORAL_TABLET | Freq: Every evening | ORAL | Status: DC | PRN
  Administered 2019-07-14 – 2019-07-16 (×3): 5 mg via ORAL
  Filled 2019-07-14 (×3): qty 1

## 2019-07-14 MED ORDER — ALVIMOPAN 12 MG PO CAPS
12.0000 mg | ORAL_CAPSULE | ORAL | Status: AC
  Administered 2019-07-14: 12 mg via ORAL
  Filled 2019-07-14: qty 1

## 2019-07-14 MED ORDER — ONDANSETRON HCL 4 MG/2ML IJ SOLN
4.0000 mg | Freq: Four times a day (QID) | INTRAMUSCULAR | Status: DC | PRN
  Administered 2019-07-14: 4 mg via INTRAVENOUS
  Filled 2019-07-14: qty 2

## 2019-07-14 MED ORDER — EPHEDRINE SULFATE 50 MG/ML IJ SOLN
INTRAMUSCULAR | Status: DC | PRN
Start: 2019-07-14 — End: 2019-07-14
  Administered 2019-07-14 (×5): 10 mg via INTRAVENOUS

## 2019-07-14 MED ORDER — KETAMINE HCL 50 MG/5ML IJ SOSY
PREFILLED_SYRINGE | INTRAMUSCULAR | Status: AC
  Filled 2019-07-14: qty 5

## 2019-07-14 MED ORDER — ROCURONIUM BROMIDE 10 MG/ML (PF) SYRINGE
PREFILLED_SYRINGE | INTRAVENOUS | Status: AC
  Filled 2019-07-14: qty 10

## 2019-07-14 MED ORDER — OXYCODONE HCL 5 MG PO TABS
5.0000 mg | ORAL_TABLET | ORAL | Status: DC | PRN
  Administered 2019-07-14 – 2019-07-16 (×4): 5 mg via ORAL
  Administered 2019-07-17 (×2): 10 mg via ORAL
  Filled 2019-07-14 (×3): qty 1
  Filled 2019-07-14 (×3): qty 2
  Filled 2019-07-14: qty 1

## 2019-07-14 MED ORDER — LABETALOL HCL 5 MG/ML IV SOLN
INTRAVENOUS | Status: AC
  Filled 2019-07-14: qty 4

## 2019-07-14 MED ORDER — AMLODIPINE BESYLATE 5 MG PO TABS
5.0000 mg | ORAL_TABLET | Freq: Every day | ORAL | Status: DC
  Administered 2019-07-14: 5 mg via ORAL
  Filled 2019-07-14: qty 1

## 2019-07-14 MED ORDER — METOPROLOL TARTRATE 5 MG/5ML IV SOLN
5.0000 mg | Freq: Four times a day (QID) | INTRAVENOUS | Status: DC | PRN
  Administered 2019-07-14: 5 mg via INTRAVENOUS
  Filled 2019-07-14: qty 5

## 2019-07-14 MED ORDER — SODIUM CHLORIDE 0.9 % IV SOLN
2.0000 g | INTRAVENOUS | Status: AC
  Administered 2019-07-14: 2 g via INTRAVENOUS
  Filled 2019-07-14: qty 2

## 2019-07-14 MED ORDER — MIDAZOLAM HCL 2 MG/2ML IJ SOLN
2.0000 mg | Freq: Once | INTRAMUSCULAR | Status: AC
  Administered 2019-07-14: 2 mg via INTRAVENOUS
  Filled 2019-07-14: qty 2

## 2019-07-14 MED ORDER — CARVEDILOL 12.5 MG PO TABS
12.5000 mg | ORAL_TABLET | Freq: Two times a day (BID) | ORAL | Status: DC
  Administered 2019-07-14 – 2019-07-15 (×2): 12.5 mg via ORAL
  Filled 2019-07-14 (×2): qty 1

## 2019-07-14 MED ORDER — MIDAZOLAM HCL 2 MG/2ML IJ SOLN
INTRAMUSCULAR | Status: AC
  Filled 2019-07-14: qty 2

## 2019-07-14 MED ORDER — DEXMEDETOMIDINE HCL 200 MCG/2ML IV SOLN
INTRAVENOUS | Status: DC | PRN
  Administered 2019-07-14: 20 ug via INTRAVENOUS

## 2019-07-14 MED ORDER — LACTATED RINGERS IV SOLN
INTRAVENOUS | Status: DC | PRN
Start: 2019-07-14 — End: 2019-07-14

## 2019-07-14 MED ORDER — PHENYLEPHRINE HCL (PRESSORS) 10 MG/ML IV SOLN
INTRAVENOUS | Status: DC | PRN
Start: 2019-07-14 — End: 2019-07-14
  Administered 2019-07-14: 100 ug via INTRAVENOUS
  Administered 2019-07-14 (×2): 40 ug via INTRAVENOUS

## 2019-07-14 MED ORDER — ESMOLOL HCL 100 MG/10ML IV SOLN
INTRAVENOUS | Status: DC | PRN
Start: 2019-07-14 — End: 2019-07-14
  Administered 2019-07-14 (×2): 10 mg via INTRAVENOUS

## 2019-07-14 MED ORDER — KETAMINE HCL 10 MG/ML IJ SOLN
INTRAMUSCULAR | Status: DC | PRN
Start: 2019-07-14 — End: 2019-07-14
  Administered 2019-07-14: 50 mg via INTRAVENOUS

## 2019-07-14 MED ORDER — CHLORHEXIDINE GLUCONATE CLOTH 2 % EX PADS
6.0000 | MEDICATED_PAD | Freq: Every day | CUTANEOUS | Status: DC
  Administered 2019-07-14 – 2019-07-15 (×2): 6 via TOPICAL

## 2019-07-14 MED ORDER — PHENYLEPHRINE 40 MCG/ML (10ML) SYRINGE FOR IV PUSH (FOR BLOOD PRESSURE SUPPORT)
PREFILLED_SYRINGE | INTRAVENOUS | Status: AC
  Filled 2019-07-14: qty 10

## 2019-07-14 MED ORDER — FENTANYL CITRATE (PF) 100 MCG/2ML IJ SOLN
INTRAMUSCULAR | Status: DC | PRN
  Administered 2019-07-14 (×5): 50 ug via INTRAVENOUS
  Administered 2019-07-14 (×2): 25 ug via INTRAVENOUS

## 2019-07-14 MED ORDER — SUGAMMADEX SODIUM 200 MG/2ML IV SOLN
INTRAVENOUS | Status: DC | PRN
  Administered 2019-07-14: 200 mg via INTRAVENOUS

## 2019-07-14 MED ORDER — DEXAMETHASONE SODIUM PHOSPHATE 10 MG/ML IJ SOLN
INTRAMUSCULAR | Status: AC
  Filled 2019-07-14: qty 1

## 2019-07-14 MED ORDER — CHLORHEXIDINE GLUCONATE 0.12 % MT SOLN
15.0000 mL | Freq: Once | OROMUCOSAL | Status: AC
  Administered 2019-07-14: 15 mL via OROMUCOSAL

## 2019-07-14 MED ORDER — ENSURE SURGERY PO LIQD
237.0000 mL | Freq: Two times a day (BID) | ORAL | Status: DC
  Administered 2019-07-14 – 2019-07-15 (×2): 237 mL via ORAL
  Filled 2019-07-14 (×9): qty 237

## 2019-07-14 MED ORDER — LACTATED RINGERS IV SOLN: Freq: Once | INTRAVENOUS | Status: AC

## 2019-07-14 MED ORDER — PROPOFOL 10 MG/ML IV BOLUS
INTRAVENOUS | Status: DC | PRN
  Administered 2019-07-14: 50 mg via INTRAVENOUS
  Administered 2019-07-14: 130 mg via INTRAVENOUS
  Administered 2019-07-14: 40 mg via INTRAVENOUS

## 2019-07-14 MED ORDER — MEPERIDINE HCL 50 MG/ML IJ SOLN: 6.2500 mg | INTRAMUSCULAR | Status: DC | PRN

## 2019-07-14 MED ORDER — SIMETHICONE 80 MG PO CHEW
40.0000 mg | CHEWABLE_TABLET | Freq: Four times a day (QID) | ORAL | Status: DC | PRN
  Administered 2019-07-14: 40 mg via ORAL
  Filled 2019-07-14: qty 1

## 2019-07-14 MED ORDER — BUPIVACAINE LIPOSOME 1.3 % IJ SUSP
INTRAMUSCULAR | Status: AC
  Filled 2019-07-14: qty 20

## 2019-07-14 MED ORDER — ACETAMINOPHEN 325 MG PO TABS: 650.0000 mg | ORAL_TABLET | Freq: Three times a day (TID) | ORAL | Status: DC

## 2019-07-14 MED ORDER — ACETAMINOPHEN 500 MG PO TABS
1000.0000 mg | ORAL_TABLET | ORAL | Status: AC
  Administered 2019-07-14: 1000 mg via ORAL
  Filled 2019-07-14: qty 2

## 2019-07-14 MED ORDER — METOPROLOL TARTRATE 5 MG/5ML IV SOLN: 5.0000 mg | Freq: Four times a day (QID) | INTRAVENOUS | Status: DC | PRN

## 2019-07-14 SURGICAL SUPPLY — 53 items
BAG HAMPER (MISCELLANEOUS) ×2 IMPLANT
CELLS DAT CNTRL 66122 CELL SVR (MISCELLANEOUS) ×1 IMPLANT
COVER LIGHT HANDLE STERIS (MISCELLANEOUS) ×4 IMPLANT
COVER WAND RF STERILE (DRAPES) ×2 IMPLANT
DRSG OPSITE POSTOP 4X8 (GAUZE/BANDAGES/DRESSINGS) ×2 IMPLANT
DRSG TEGADERM 2-3/8X2-3/4 SM (GAUZE/BANDAGES/DRESSINGS) ×6 IMPLANT
DRSG TEGADERM 4X4.75 (GAUZE/BANDAGES/DRESSINGS) ×1 IMPLANT
ELECT REM PT RETURN 9FT ADLT (ELECTROSURGICAL) ×2
ELECTRODE REM PT RTRN 9FT ADLT (ELECTROSURGICAL) ×1 IMPLANT
GAUZE SPONGE 4X4 12PLY STRL LF (GAUZE/BANDAGES/DRESSINGS) ×2 IMPLANT
GLOVE BIO SURGEON STRL SZ 6.5 (GLOVE) ×4 IMPLANT
GLOVE BIOGEL PI IND STRL 6.5 (GLOVE) ×2 IMPLANT
GLOVE BIOGEL PI IND STRL 7.0 (GLOVE) ×6 IMPLANT
GLOVE BIOGEL PI INDICATOR 6.5 (GLOVE) ×2
GLOVE BIOGEL PI INDICATOR 7.0 (GLOVE) ×6
GLOVE ECLIPSE 6.5 STRL STRAW (GLOVE) ×2 IMPLANT
GLOVE SURG SS PI 7.5 STRL IVOR (GLOVE) ×5 IMPLANT
GOWN STRL REUS W/TWL LRG LVL3 (GOWN DISPOSABLE) ×12 IMPLANT
INST SET LAPROSCOPIC AP (KITS) ×2 IMPLANT
INST SET MAJOR GENERAL (KITS) ×2 IMPLANT
KIT TURNOVER CYSTO (KITS) ×2 IMPLANT
LIGASURE IMPACT 36 18CM CVD LR (INSTRUMENTS) ×1 IMPLANT
LIGASURE LAP ATLAS 10MM 37CM (INSTRUMENTS) ×1 IMPLANT
MANIFOLD NEPTUNE II (INSTRUMENTS) ×2 IMPLANT
NDL HYPO 18GX1.5 BLUNT FILL (NEEDLE) ×1 IMPLANT
NEEDLE HYPO 18GX1.5 BLUNT FILL (NEEDLE) ×2 IMPLANT
NEEDLE HYPO 22GX1.5 SAFETY (NEEDLE) ×2 IMPLANT
NS IRRIG 1000ML POUR BTL (IV SOLUTION) ×6 IMPLANT
PAD ARMBOARD 7.5X6 YLW CONV (MISCELLANEOUS) ×2 IMPLANT
PENCIL SMOKE EVACUATOR COATED (MISCELLANEOUS) ×2 IMPLANT
RELOAD PROXIMATE 75MM BLUE (ENDOMECHANICALS) ×4 IMPLANT
RELOAD STAPLE 75 3.8 BLU REG (ENDOMECHANICALS) IMPLANT
RETRACTOR WND ALEXIS 18 MED (MISCELLANEOUS) IMPLANT
RTRCTR WOUND ALEXIS 18CM MED (MISCELLANEOUS) ×2
SET TUBE SMOKE EVAC HIGH FLOW (TUBING) ×2 IMPLANT
SHEET LAVH (DRAPES) ×2 IMPLANT
SLEEVE ENDOPATH XCEL 5M (ENDOMECHANICALS) ×1 IMPLANT
SPONGE GAUZE 2X2 8PLY STRL LF (GAUZE/BANDAGES/DRESSINGS) ×4 IMPLANT
SPONGE LAP 18X18 RF (DISPOSABLE) ×4 IMPLANT
STAPLER GUN LINEAR PROX 60 (STAPLE) ×2 IMPLANT
STAPLER PROXIMATE 75MM BLUE (STAPLE) ×1 IMPLANT
STAPLER VISISTAT (STAPLE) ×2 IMPLANT
SUT PDS AB CT VIOLET #0 27IN (SUTURE) ×4 IMPLANT
SUT SILK 3 0 SH CR/8 (SUTURE) ×3 IMPLANT
SYR 10ML LL (SYRINGE) ×2 IMPLANT
SYR 20ML LL LF (SYRINGE) ×4 IMPLANT
TRAY COLON PACK (CUSTOM PROCEDURE TRAY) ×2 IMPLANT
TRAY FOLEY MTR SLVR 16FR STAT (SET/KITS/TRAYS/PACK) ×2 IMPLANT
TROCAR ENDO BLADELESS 11MM (ENDOMECHANICALS) ×2 IMPLANT
TROCAR XCEL NON-BLD 5MMX100MML (ENDOMECHANICALS) ×2 IMPLANT
TROCAR XCEL UNIV SLVE 11M 100M (ENDOMECHANICALS) ×2 IMPLANT
WARMER LAPAROSCOPE (MISCELLANEOUS) ×2 IMPLANT
YANKAUER SUCT BULB TIP 10FT TU (MISCELLANEOUS) ×4 IMPLANT

## 2019-07-14 NOTE — Transfer of Care (Signed)
Immediate Anesthesia Transfer of Care Note  Patient: Anthony Vaughn  Procedure(s) Performed: LAPAROSCOPIC  LEFT HEMICOLECTOMY (N/A Abdomen)  Patient Location: PACU  Anesthesia Type:General  Level of Consciousness: awake, alert , drowsy and patient cooperative  Airway & Oxygen Therapy: Patient Spontanous Breathing and Patient connected to nasal cannula oxygen  Post-op Assessment: Report given to RN, Post -op Vital signs reviewed and stable and Patient moving all extremities  Post vital signs: Reviewed and stable  Last Vitals:  Vitals Value Taken Time  BP 127/100 07/14/19 1107  Temp    Pulse 82 07/14/19 1111  Resp 16 07/14/19 1111  SpO2 100 % 07/14/19 1111  Vitals shown include unvalidated device data.  Last Pain:  Vitals:   07/14/19 0710  TempSrc: Oral  PainSc: 0-No pain      Patients Stated Pain Goal: 7 (07/14/19 0710)  Complications: No apparent anesthesia complications

## 2019-07-14 NOTE — Plan of Care (Signed)

## 2019-07-14 NOTE — Interval H&P Note (Signed)
History and Physical Interval Note:  07/14/2019 7:00 AM  Anthony Vaughn  has presented today for surgery, with the diagnosis of Sigmoid diverticulitis, sigmoid stricture.  The various methods of treatment have been discussed with the patient and family. After consideration of risks, benefits and other options for treatment, the patient has consented to  Procedure(s): LAPAROSCOPIC PARTIAL COLECTOMY (N/A) as a surgical intervention.  The patient's history has been reviewed, patient examined, no change in status, stable for surgery.  I have reviewed the patient's chart and labs.  Questions were answered to the patient's satisfaction.    Completed his bowel prep and antibiotics. He misunderstood his diet yesterday and did have some beans in the afternoon. He says 1/2-1cup. He says his stools have been yellow liquid yesterday but then he had some sediment this Am with yellow liquid. No formed or solid stool.    He has been having severe episodes of diverticulitis and struggling for the past several months. I discussed with my partner and I think the benefit of proceeding outweighs the slightly increased risk of issues.    Lucretia Roers

## 2019-07-14 NOTE — Consult Note (Addendum)
[facility] Medical Consultation  Anthony Vaughn IRC:[id number] DOB: 03/04/1970 DOA: 07/14/2019 PCP: Leonie Douglas, MD   Requesting physician: Dr. Constance Haw Date of consultation: 07/14/19 Reason for consultation: Hypertension  Impression/Recommendations Principal Problem:   Sigmoid stricture ([place]) Active Problems:   Hypertension   Diverticulitis of colon    1. Hypertensive urgency - patient with chronic HTN how markedly elevated. He has not responded to his routine meds or IV metoprolo 5 mg x 2.  Plan Admit to inpatient care - stepdown unit  IV nicardene - pharmacy to assist with dosing: goal SBP >100< 160  Will d/c amlodipine while on nicardene drip  Continue home meds  2. Sigmoid colectomy - per Dr. Constance Haw   I will followup again tomorrow. Please contact me if I can be of assistance in the meanwhile. Thank you for this consultation.  Chief Complaint: Uncontrolled hypertension  HPI:  Anthony Vaughn, a 49 y/o man followed for hypertension as an outpatient with prehospital meds including ARB, coreg, CCB-amlodipine. He reports his BP is generally well controlled but did have SBP 167 the day before surgery. He has not h/o ASVD, no family hx ASVD. He has not had any cardiac testing. He was admitted for laproscopic sigmoid-colectomy due to sigmoid stricture and diverticulitis. Post op he has developed marked hypertension with SBP 197, DBP 141. He did have a complaint of chest pain. He has not prior h/o ASVD  Review of Systems:  HEENT - unremarkable Pul - no report of respiratory difficulty or problems Card - reports chest wall pain that is intermittent and chronic. Denies palpitations. GI- post op pain, controlled. Has been treated for Nausea GU - unremarkable MSK - unremarkable Neuro - report intermittent mild HA  Past Medical History:  Diagnosis Date  . Arthritis   . Diverticulitis   . GERD (gastroesophageal reflux disease)   . HTN (hypertension)    Past Surgical  History:  Procedure Laterality Date  . BIOPSY  03/04/2019   Procedure: BIOPSY;  Surgeon: Daneil Dolin, MD;  [facility];  Service: Endoscopy;;  gastric  . COLONOSCOPY    . COLONOSCOPY WITH PROPOFOL N/A 03/04/2019   Procedure: COLONOSCOPY WITH PROPOFOL;  Surgeon: Daneil Dolin, MD;  Location[facility];  Service: Endoscopy;  Laterality: N/A;  7:30am  . ESOPHAGOGASTRODUODENOSCOPY (EGD) WITH PROPOFOL N/A 03/04/2019   Procedure: ESOPHAGOGASTRODUODENOSCOPY (EGD) WITH PROPOFOL;  Surgeon: Daneil Dolin, MD;  [facility];  Service: Endoscopy;  Laterality: N/A;  . NO PAST SURGERIES     Soc Hx - lives with partner. Three children 17, 15, 12.Work - Psychiatrist  Social History:  reports that he has been smoking cigarettes. He has a 12.50 pack-year smoking history. He has never used smokeless tobacco. He reports that he does not drink alcohol or use drugs.  Allergies  Allergen Reactions  . Other     States he's allergic to a blood pressure pill that causes abdominal pain. He's not sure name of medication   Family History  Problem Relation Age of Onset  . Lung cancer Father   . Colon cancer Neg Hx   . Colon polyps Neg Hx     Prior to Admission medications   Medication Sig Start Date End Date Taking? Authorizing Provider  amLODipine (NORVASC) 5 MG tablet Take 5 mg by mouth daily. 06/27/19  Yes [provider]  carvedilol (COREG) 12.5 MG tablet Take 12.5 mg by mouth 2 (two) times daily with a meal.  Yes [provider]  docusate sodium (COLACE) 100 MG capsule Take 1 capsule (100 mg total) by mouth 2 (two) times daily. Start using this stool softener now. 06/20/19  Yes Lucretia Roers, MD  lisinopril (PRINIVIL,ZESTRIL) 20 MG tablet Take 1 tablet (20 mg total) by mouth daily. 01/08/13  Yes Bethann Berkshire, MD  Wheat Dextrin (BENEFIBER DRINK MIX PO) Take by mouth. Takes 2 tablespoons daily.   Yes [provider]   ALISKIREN-AMLODIPINE PO Take by mouth. Pt not sure of dosage.    [provider]  pantoprazole (PROTONIX) 40 MG tablet Take 1 tablet (40 mg total) by mouth 2 (two) times daily. 10/21/18 04/19/19  Letta Median, PA-C   Physical Exam: Blood pressure (!) 197/141, pulse (!) 112, temperature 99.9 F (37.7 C), temperature source Oral, resp. rate 14, height 6' (1.829 m), weight 68.5 kg, SpO2 100 %. Vitals:   07/14/19 1501 07/14/19 1900  BP: (!) 159/112 (!) 197/141  Pulse: 80 (!) 112  Resp: 14   Temp: 97.9 F (36.6 C) 99.9 F (37.7 C)  SpO2: 98% 100%     General:  WNWD man, mildly diaphoretic  Eyes: C&S clear,   Neck: Supple  Cardiovascular: 2+ radial and DP pulse, quiet precordium, RRR w/o mm/r/g  Respiratory: normal respirations, Clear to A&P  Abdomen: Hypoactive BS  Skin: Clear  Musculoskeletal: no joint deformity  Psychiatric: Stable mood  Neurologic: A&O x 3, CN II-XII normal, mentation normal  Labs on Admission:  Basic Metabolic Panel: Recent Labs  Lab 07/11/19 0856  NA 139  K 4.0  CL 107  CO2 21*  GLUCOSE 92  BUN 15  CREATININE 1.00  CALCIUM 9.7   Liver Function Tests: No results for input(s): AST, ALT, ALKPHOS, BILITOT, PROT, ALBUMIN in the last 168 hours. No results for input(s): LIPASE, AMYLASE in the last 168 hours. No results for input(s): AMMONIA in the last 168 hours. CBC: Recent Labs  Lab 07/11/19 0856  WBC 4.5  NEUTROABS 2.0  HGB 13.2  HCT 40.1  MCV 93.7  PLT 271   Cardiac Enzymes: No results for input(s): CKTOTAL, CKMB, CKMBINDEX, TROPONINI in the last 168 hours. BNP: Invalid input(s): POCBNP CBG: Recent Labs  Lab 07/14/19 1851  GLUCAP 80    Radiological Exams on Admission: No results found.  EKG: Independently reviewed. Sinus tacycardia with PVC, LAB, no acute changes  Time spent: 60 min  Illene Regulus [facility] Pager [phone]  If 7PM-7AM, please contact  night-coverage [email or web address] Password [facility] 07/14/2019, 7:23 PM

## 2019-07-14 NOTE — Anesthesia Procedure Notes (Signed)
Procedure Name: Intubation Performed by: Brynda Peon, CRNA Pre-anesthesia Checklist: Patient identified, Emergency Drugs available, Suction available, Patient being monitored and Timeout performed Patient Re-evaluated:Patient Re-evaluated prior to induction Oxygen Delivery Method: Circle system utilized Preoxygenation: Pre-oxygenation with 100% oxygen Induction Type: IV induction Laryngoscope Size: Miller and 2 Grade View: Grade I Tube type: Oral Tube size: 7.5 mm Airway Equipment and Method: Stylet Placement Confirmation: ETT inserted through vocal cords under direct vision,  positive ETCO2,  CO2 detector and breath sounds checked- equal and bilateral Secured at: 23 cm Tube secured with: Tape Dental Injury: Teeth and Oropharynx as per pre-operative assessment

## 2019-07-14 NOTE — Progress Notes (Addendum)
[facility]  Patient seen and abdomen soft, appropriately tender, IS pulled to 1200. Explained rationale for going to step down for BP control. Appreciate Dr. Debby Bud help.  Algis Greenhouse, MD [facility] [address] [place] [place], [place] [address] [phone] (office)

## 2019-07-14 NOTE — Progress Notes (Addendum)
[facility]  Patient seen this evening. Sweating some. Says his abdominal pain is 5/10. Says he has some chest pain/ odd pain he cannot describe. He did not say that pressure.  He does not describe indigestion.  EKG essentially the same, HR 106 on this, no obvious ST elevation. Troponin pending.   Given the HR have changed parameters of the metoprolol to BP > 180 and HR > 100.  RN will give metoprolol now. Will monitor closely. RN checking BS also.   Algis Greenhouse, MD [facility] [address] [place], [place] [address] [phone] (office)

## 2019-07-14 NOTE — Progress Notes (Signed)
[facility]  Spoke with Arlyss Queen, surgery completed. Foley in place, Clears today, pain control.  Algis Greenhouse, MD [facility] [address] [place], [place] [address] [phone] (office)

## 2019-07-14 NOTE — Op Note (Signed)
[facility] Operative Note  07/14/19  Preoperative Diagnosis: Sigmoid stricture, sigmoid diverticulitis   Postoperative Diagnosis: Same   Procedure(s) Performed: Laparoscopic left hemicolectomy, splenic flexure take down    Surgeon: Lanell Matar. Constance Haw, MD   Assistants: Aviva Signs, MD     Anesthesia: General endotracheal   Anesthesiologist:  Dr. 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 He has continued to have issues and we discussed the option of partial colectomy with the risk of bleeding, infection, anastomotic leak, injury to other organs like the ureter, and risk of needing more surgery.  He opted to proceed.   Findings: Hardened area of sigmoid colon, diverticula in the descending colon    Procedure: The patient was taken to the operating room and placed supine. General endotracheal anesthesia was induced. Intravenous antibiotics were administered per protocol.  An orogastric tube positioned to decompress the stomach. A foley was placed. He was then put in lithotomy position and all pressure points were padded.  The abdomen and perineum were prepared and draped in the usual sterile fashion.   An infraumbilical incision was made and a Veress needle was inserted with the towel clip elevating the abdominal wall.  Low insufflation pressures and the saline drop test confirmed entry into the peritoneum. A 40mm trocar was placed under direct visualization.  No injury was noted inferiorly. The patient was placed in Trendelenburg and additional 5 mm trocar was placed in the right mid abdomen and 11 mm in the right lower abdomen under direct visualization.  The patient's sigmoid colon was adherent and scarred  to the anterior pelvic wall on the left. There was additional adhesions along the sigmoid colon laterally. Using a combination of electrocautery and blunt dissection these were carefully taken down.  Ensuring that the left ureter was identified and protected.  The white line of Toldt was taken down along the descending colon all the way to the splenic flexure.  An additional assistant prot was placed and a 5 mm trocar was put in the left mid abdomen for retraction. The transverse colon was very redundant, and the splenic flexure was taken down as it was evident that with the scarring and thickening of the sigmoid colon and diverticula in the descending colon that we would need the length.  Using the Ligasure, the splenic flexure was taken down.  Attention was then turned back to the pelvis, and the sigmoid colon thickening stopped at the distal sigmoid/ proximal rectum. This part of the colon was healthy. To give more length on the rectum, the lateral attachments were scored down into the pelvis to mobilize the rectum above the pelvic brim.    After this dissection, it was clear that there was redundant enough colon to complete the surgery extracorporeal.  The infraumbilical incision was extended inferiorly, and a small wound protector was placed.  The hardened sigmoid colon was eviscerated and there was a second harding/ mass just superior to this with significant diverticula on the descending colon as well.  We had some difficult in getting the entire colon down from the splenic flexure, as there was an additional attachment that was missed. With the help of retraction this was completed with electrocautery and blunt dissection.  The proximal and distal points of transection were determined on the transverse colon and on the distal sigmoid colon respectively.  These were taken with linear cutting 75 mm staplers, and the mesentery was taken high with a Ligasure, ensuring to protect the ureter.  In order to get to  the distal colon, the small bowel was eviscerated and covered with a damp laparotomy pad. There was sufficient length in the remaining distal sigmoid and rectum to allow for a side to side anastomosis with a linear cutting 75 mm stapler.  The transverse colon easily fell in to the pelvis under no tension. The two ends of bowel were laid side by side without twisting and two colotomies were made. The 75 mm linear cutting stapler created the common channel. The colotomy was closed with a TA 60 stapler. The staple line was oversewn with 3-0 Lembert Silk sutures. Two 3-0 silk crotch sutures were placed. Thde anastomosis was open, and laid tension free in the pelvis.  The small bowel and omentum were placed back into the abdomen in anatomic position.   The entire team changed gowns and gloves and a new closing tray was used. The extraction site was closed with 0 PDS running suture. The right lower abdomen site was closed with 0 Vicryl suture. The skin was closed with staples. Sterile dressings were placed.   Dr. Lovell Sheehan was assisting throughout the procedure and was present for the critical portions of the case.   Final inspection revealed acceptable hemostasis. All counts were correct at the end of the case. The patient was awakened from anesthesia and extubated without complication.  The patient went to the PACU in stable condition.   Algis Greenhouse, MD [facility] [address] [place], [place] [address] [phone] (office)

## 2019-07-14 NOTE — Progress Notes (Signed)
[facility]  RN recheck BP and ~ 200 systolic. No further chest pain. Given the continued HTN, and recent chest pain. I want to get someone else's opinion with regards to his BP and possibly needing to increase his meds.  I have asked Dr. Debby Bud to weigh in regarding the HTN.    He has been on his home Norvasc, coreg, he held his lisinopril today for surgery.    Troponin pending.   Appreciate assistance.   Anthony Greenhouse, MD [facility] [address] [place], [place] [address] [phone] (office)

## 2019-07-14 NOTE — Anesthesia Preprocedure Evaluation (Addendum)
Anesthesia Evaluation  Patient identified by MRN, date of birth, ID band Patient awake    Reviewed: Allergy & Precautions, NPO status , Patient's Chart, lab work & pertinent test results, reviewed documented beta blocker date and time   History of Anesthesia Complications Negative for: history of anesthetic complications  Airway Mallampati: II  TM Distance: >3 FB Neck ROM: Full    Dental  (+) Dental Advisory Given, Poor Dentition, Chipped   Pulmonary Current Smoker and Patient abstained from smoking.,    Pulmonary exam normal breath sounds clear to auscultation       Cardiovascular Exercise Tolerance: Good hypertension (not well controlled), Pt. on medications and Pt. on home beta blockers Normal cardiovascular exam Rhythm:Regular Rate:Normal  11-Jul-2019 08:37:40 [facility] ROUTINE RECORD Normal sinus rhythm Minimal voltage criteria for LVH, may be normal variant ( Sokolow-Lyon ) Possible Inferior infarct , age undetermined Abnormal ECG   Neuro/Psych negative neurological ROS  negative psych ROS   GI/Hepatic Neg liver ROS, Bowel prep,GERD  Medicated,Diverticulitis    Endo/Other  negative endocrine ROS  Renal/GU negative Renal ROS     Musculoskeletal  (+) Arthritis ,   Abdominal   Peds  Hematology   Anesthesia Other Findings   Reproductive/Obstetrics                           Anesthesia Physical Anesthesia Plan  ASA: III  Anesthesia Plan: General   Post-op Pain Management:    Induction: Intravenous  PONV Risk Score and Plan: 3 and Ondansetron, Dexamethasone, Midazolam and Treatment may vary due to age or medical condition  Airway Management Planned: Oral ETT  Additional Equipment:   Intra-op Plan:   Post-operative Plan: Extubation in OR  Informed Consent: I have reviewed the patients History and Physical, chart, labs and discussed the procedure  including the risks, benefits and alternatives for the proposed anesthesia with the patient or authorized representative who has indicated his/her understanding and acceptance.     Dental advisory given  Plan Discussed with: CRNA and Surgeon  Anesthesia Plan Comments:        Anesthesia Quick Evaluation

## 2019-07-14 NOTE — Anesthesia Postprocedure Evaluation (Signed)
Anesthesia Post Note  Patient: Damichael Hofman  Procedure(s) Performed: LAPAROSCOPIC  LEFT HEMICOLECTOMY (N/A Abdomen)  Patient location during evaluation: PACU Anesthesia Type: General Level of consciousness: awake, oriented, awake and alert and patient cooperative Pain management: pain level controlled Vital Signs Assessment: post-procedure vital signs reviewed and stable Respiratory status: nonlabored ventilation, respiratory function stable and spontaneous breathing Cardiovascular status: blood pressure returned to baseline and stable Postop Assessment: no headache and no backache Anesthetic complications: no     Last Vitals:  Vitals:   07/14/19 0710  BP: (!) 161/121  Pulse: 71  Resp: 16  Temp: 36.8 C  SpO2: 100%    Last Pain:  Vitals:   07/14/19 0710  TempSrc: Oral  PainSc: 0-No pain                 Brynda Peon

## 2019-07-14 NOTE — H&P (Signed)
Interval H&P this AM attached to progress note in clinic. This is the H&P for this visit.   [facility] History and Physical  Reason for Referral: Sigmoid stricture      Anthony Vaughn is a 49 y.o. male.  HPI: Anthony Vaughn is a 49 yo that has a history of diverticulitis and multiple episodes in the last year with abdominal pain that comes on about monthly. He has been doing better clinically but we discussed repeat imaging the last time I saw him, and repeat CT demonstrates this continued strictured area seen on CT. He currently is eating and drinking and having no abdominal pain. He has regular BMs. He understands that this stricture will not improve and will continue to worsening and cause more narrowing with time.       Past Medical History:  Diagnosis Date  . Arthritis   . Diverticulitis   . GERD (gastroesophageal reflux disease)   . HTN (hypertension)         Past Surgical History:  Procedure Laterality Date  . BIOPSY  03/04/2019   Procedure: BIOPSY; Surgeon: Daneil Dolin, MD; Location: [facility]; Service: Endoscopy;; gastric  . COLONOSCOPY    . COLONOSCOPY WITH PROPOFOL N/A 03/04/2019   Procedure: COLONOSCOPY WITH PROPOFOL; Surgeon: Daneil Dolin, MD; Location: [facility]; Service: Endoscopy; Laterality: N/A; 7:30am  . ESOPHAGOGASTRODUODENOSCOPY (EGD) WITH PROPOFOL N/A 03/04/2019   Procedure: ESOPHAGOGASTRODUODENOSCOPY (EGD) WITH PROPOFOL; Surgeon: Daneil Dolin, MD; Location: [facility]; Service: Endoscopy; Laterality: N/A;  . NO PAST SURGERIES          Family History  Problem Relation Age of Onset  . Lung cancer Father   . Colon cancer Neg Hx   . Colon polyps Neg Hx    Social History        Tobacco Use  . Smoking status: Current Every Day Smoker    Packs/day: 0.50    Years: 25.00    Pack years: 12.50    Types: Cigarettes  . Smokeless tobacco: Never Used  Substance Use Topics  . Alcohol use: No  . Drug use: No   Medications:  I have reviewed the patient's current medications.       Allergies as of 06/10/2019      Reactions   Other    States he's allergic to a blood pressure pill that causes abdominal pain. He's not sure name of medication           Medication List       Accurate as of Jun 10, 2019 11:59 PM. If you have any questions, ask your nurse or doctor.        ALISKIREN-AMLODIPINE PO  Take by mouth. Pt not sure of dosage.   BENEFIBER DRINK MIX PO  Take by mouth. Takes 2 tablespoons daily.   carvedilol 12.5 MG tablet  Commonly known as: COREG  Take 12.5 mg by mouth 2 (two) times daily with a meal.   lisinopril 20 MG tablet  Commonly known as: ZESTRIL  Take 1 tablet (20 mg total) by mouth daily.   pantoprazole 40 MG tablet  Commonly known as: Protonix  Take 1 tablet (40 mg total) by mouth 2 (two) times daily.       ROS:  A comprehensive review of systems was negative except for: Gastrointestinal: positive for abdominal pain with episodes of diverticulitis  Blood pressure (!) 152/108, pulse 88, temperature 98.5 F (36.9 C), temperature source Oral, resp. rate 12, height 6' (1.829  m), weight 161 lb (73 kg), SpO2 98 %.  Physical Exam  Vitals reviewed.  Constitutional:  Appearance: He is normal weight.  HENT:  Head: Normocephalic and atraumatic.  Nose: Nose normal.  Mouth/Throat:  Mouth: Mucous membranes are moist.  Eyes:  Extraocular Movements: Extraocular movements intact.  Pupils: Pupils are equal, round, and reactive to light.  Cardiovascular:  Rate and Rhythm: Normal rate and regular rhythm.  Pulmonary:  Effort: Pulmonary effort is normal.  Breath sounds: Normal breath sounds.  Abdominal:  General: There is no distension.  Palpations: Abdomen is soft.  Tenderness: There is no abdominal tenderness.  Musculoskeletal:  General: No swelling. Normal range of motion.  Cervical back: Normal range of motion. No rigidity.  Skin:  General: Skin is warm.  Neurological:  General:  No focal deficit present.  Mental Status: He is alert and oriented to person, place, and time.  Psychiatric:  Mood and Affect: Mood normal.  Behavior: Behavior normal.  Thought Content: Thought content normal.  Judgment: Judgment normal.   Results:  Personally reviewed and showed patient- thickened segment of colon  CLINICAL DATA: Follow-up diverticulitis. Sigmoid colon stricture on  recent colonoscopy.  EXAM:  CT ABDOMEN AND PELVIS WITH CONTRAST  TECHNIQUE:  Multidetector CT imaging of the abdomen and pelvis was performed  using the standard protocol following bolus administration of  intravenous contrast. Rectal contrast was also administered.  CONTRAST: OMNIPAQUE IOHEXOL 300 MG/ML SOLN  COMPARISON: 02/23/2019 from [facility]  FINDINGS:  Lower Chest: No acute findings.  Hepatobiliary: No hepatic masses identified. Stable tiny sub-cm cyst  in superior left hepatic lobe. Gallbladder is unremarkable. No  evidence of biliary ductal dilatation.  Pancreas: No mass or inflammatory changes.  Spleen: Within normal limits in size and appearance.  Adrenals/Urinary Tract: No masses identified. No evidence of  ureteral calculi or hydronephrosis.  Stomach/Bowel: Sigmoid colon diverticulosis is again demonstrated.  Rectal contrast shows the presence of short segment wall thickening  and luminal narrowing in the proximal sigmoid colon, in area of  diverticulosis. This appears similar to previous study, favoring  chronic diverticulitis or muscular hypertrophy over carcinoma. No  acute inflammatory process or abnormal fluid collections seen. No  evidence of bowel obstruction.  Vascular/Lymphatic: No pathologically enlarged lymph nodes. No  abdominal aortic aneurysm. Aortic atherosclerosis incidentally  noted.  Reproductive: No mass or other significant abnormality.  Other: None.  Musculoskeletal: No suspicious bone lesions identified.  IMPRESSION:  1. Sigmoid colon  diverticulosis, with short-segment wall thickening  and luminal narrowing which appears similar to prior exam. This  favors chronic diverticulitis/stricture or muscular hypertrophy over  colon carcinoma. Recommend correlation with recent colonoscopy  results.  2. No evidence of metastatic disease or other or other signal  abnormality.  Aortic Atherosclerosis (ICD10-I70.0).  Electronically Signed  By: Danae Orleans M.D.  On: 06/05/2019 16:18  Assessment & Plan:  Anthony Vaughn is a 49 y.o. 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 Doing well overall but understands this is not going to resolve on its own. He is aware that surgery is in his future.  -Discussed laparoscopic partial colectomy and the risk of bleeding, infection, anastomotic leak, need for bowel preparation. Discussed need for potential blood and risk of allergic reactions and infections.  -He is going to talk to his wife and figure out timing. He works [employer], and needs to be off 6-8 weeks to prevent hernia after surgery.  -Will do a phone call next week  Future Appointments  Date Time Provider Department Center  06/19/2019 3:50 PM Anthony Roers, MD [facility] None  10/21/2019 9:30 AM Anice Paganini, NP [facility]   All questions were answered to the satisfaction of the patient.  Anthony Vaughn  06/13/2019, 12:15 PM

## 2019-07-15 ENCOUNTER — Encounter (HOSPITAL_COMMUNITY): Payer: Self-pay | Admitting: Internal Medicine

## 2019-07-15 ENCOUNTER — Other Ambulatory Visit: Payer: Self-pay

## 2019-07-15 ENCOUNTER — Other Ambulatory Visit (HOSPITAL_COMMUNITY): Payer: Self-pay | Admitting: *Deleted

## 2019-07-15 ENCOUNTER — Inpatient Hospital Stay (HOSPITAL_COMMUNITY): Payer: Medicaid Other

## 2019-07-15 DIAGNOSIS — I340 Nonrheumatic mitral (valve) insufficiency: Secondary | ICD-10-CM

## 2019-07-15 DIAGNOSIS — I5022 Chronic systolic (congestive) heart failure: Secondary | ICD-10-CM | POA: Diagnosis present

## 2019-07-15 DIAGNOSIS — Z9049 Acquired absence of other specified parts of digestive tract: Secondary | ICD-10-CM

## 2019-07-15 DIAGNOSIS — Z720 Tobacco use: Secondary | ICD-10-CM | POA: Diagnosis present

## 2019-07-15 DIAGNOSIS — I361 Nonrheumatic tricuspid (valve) insufficiency: Secondary | ICD-10-CM

## 2019-07-15 LAB — CBC WITH DIFFERENTIAL/PLATELET
Abs Immature Granulocytes: 0.01 10*3/uL (ref 0.00–0.07)
Basophils Absolute: 0 10*3/uL (ref 0.0–0.1)
Basophils Relative: 0 %
Eosinophils Absolute: 0 10*3/uL (ref 0.0–0.5)
Eosinophils Relative: 0 %
HCT: 34.1 % — ABNORMAL LOW (ref 39.0–52.0)
Hemoglobin: 11.1 g/dL — ABNORMAL LOW (ref 13.0–17.0)
Immature Granulocytes: 0 %
Lymphocytes Relative: 31 %
Lymphs Abs: 2.4 10*3/uL (ref 0.7–4.0)
MCH: 31 pg (ref 26.0–34.0)
MCHC: 32.6 g/dL (ref 30.0–36.0)
MCV: 95.3 fL (ref 80.0–100.0)
Monocytes Absolute: 0.7 10*3/uL (ref 0.1–1.0)
Monocytes Relative: 9 %
Neutro Abs: 4.7 10*3/uL (ref 1.7–7.7)
Neutrophils Relative %: 60 %
Platelets: 249 10*3/uL (ref 150–400)
RBC: 3.58 MIL/uL — ABNORMAL LOW (ref 4.22–5.81)
RDW: 13.1 % (ref 11.5–15.5)
WBC: 7.8 10*3/uL (ref 4.0–10.5)
nRBC: 0 % (ref 0.0–0.2)

## 2019-07-15 LAB — ECHOCARDIOGRAM COMPLETE
Height: 72 in
Weight: 2680.79 oz

## 2019-07-15 LAB — BASIC METABOLIC PANEL
Anion gap: 6 (ref 5–15)
BUN: 14 mg/dL (ref 6–20)
CO2: 24 mmol/L (ref 22–32)
Calcium: 8.8 mg/dL — ABNORMAL LOW (ref 8.9–10.3)
Chloride: 108 mmol/L (ref 98–111)
Creatinine, Ser: 1.16 mg/dL (ref 0.61–1.24)
GFR calc Af Amer: >60 mL/min (ref >60)
GFR calc non Af Amer: >60 mL/min (ref >60)
Glucose, Bld: 105 mg/dL — ABNORMAL HIGH (ref 70–99)
Potassium: 4.1 mmol/L (ref 3.5–5.1)
Sodium: 138 mmol/L (ref 135–145)

## 2019-07-15 LAB — MAGNESIUM: Magnesium: 1.6 mg/dL — ABNORMAL LOW (ref 1.7–2.4)

## 2019-07-15 LAB — PHOSPHORUS: Phosphorus: 2.9 mg/dL (ref 2.5–4.6)

## 2019-07-15 LAB — MRSA PCR SCREENING: MRSA by PCR: NEGATIVE

## 2019-07-15 MED ORDER — AMLODIPINE BESYLATE 5 MG PO TABS: 5.0000 mg | ORAL_TABLET | Freq: Every day | ORAL | Status: DC

## 2019-07-15 MED ORDER — CARVEDILOL 3.125 MG PO TABS
6.2500 mg | ORAL_TABLET | Freq: Two times a day (BID) | ORAL | Status: DC
  Administered 2019-07-15: 6.25 mg via ORAL
  Filled 2019-07-15: qty 2

## 2019-07-15 MED ORDER — CARVEDILOL 12.5 MG PO TABS
12.5000 mg | ORAL_TABLET | Freq: Two times a day (BID) | ORAL | Status: DC
  Administered 2019-07-15 – 2019-07-16 (×3): 12.5 mg via ORAL
  Filled 2019-07-15 (×3): qty 1

## 2019-07-15 MED ORDER — MAGNESIUM SULFATE 2 GM/50ML IV SOLN
2.0000 g | Freq: Once | INTRAVENOUS | Status: AC
  Administered 2019-07-15: 2 g via INTRAVENOUS
  Filled 2019-07-15: qty 50

## 2019-07-15 MED ORDER — NICOTINE 14 MG/24HR TD PT24
14.0000 mg | MEDICATED_PATCH | Freq: Every day | TRANSDERMAL | Status: DC
  Administered 2019-07-15 – 2019-07-17 (×3): 14 mg via TRANSDERMAL
  Filled 2019-07-15 (×3): qty 1

## 2019-07-15 MED ORDER — LISINOPRIL 10 MG PO TABS: 20.0000 mg | ORAL_TABLET | Freq: Every day | ORAL | Status: DC

## 2019-07-15 NOTE — Progress Notes (Signed)
Transported from CCU.  Alert and oriented.  Dressing and Lap sites to abd dry and intact , abd binder in place.Vitals stable. Restarted LR

## 2019-07-15 NOTE — Progress Notes (Signed)
Ambulated pt in the hall. Pt was able to do two laps around the floor with no issues. Pt stated he felt good walking

## 2019-07-15 NOTE — Progress Notes (Signed)
*  PRELIMINARY RESULTS* Echocardiogram 2D Echocardiogram has been performed.  Stacey Drain 07/15/2019, 12:26 PM

## 2019-07-15 NOTE — Progress Notes (Signed)
[facility] Progress Note  1 Day Post-Op  Subjective: Feeling better. Saw this Am and this PM. Says had multiple Bms. No more bleeding this PM after Bms. Tolerated clears. BP better controlled. Some degree of HF on the ECHO. Dr. Asencion Partridge help appreciated. Pain controlled. Urinating.   Objective: Vital signs in last 24 hours: Temp:  [97.6 F (36.4 C)-99.2 F (37.3 C)] 98 F (36.7 C) (06/08 1854) Pulse Rate:  [64-103] 85 (06/08 1854) Resp:  [14-22] 16 (06/08 1854) BP: (80-206)/(49-155) 135/98 (06/08 1854) SpO2:  [95 %-100 %] 100 % (06/08 1854) Weight:  [76 kg] 76 kg (06/08 0424) Last BM Date: 07/14/19  Intake/Output from previous day: 06/07 0701 - 06/08 0700 In: 4831.5 [P.O.:60; I.V.:4571.5; IV Piggyback:200] Out: 3250 [Urine:3200; Blood:50] Intake/Output this shift: No intake/output data recorded.  General appearance: alert, cooperative and no distress Resp: normal work of breathing GI: soft, mildly distended, appropriately tender  Lab Results:  Recent Labs    07/15/19 0412  WBC 7.8  HGB 11.1*  HCT 34.1*  PLT 249   BMET Recent Labs    07/15/19 0412  NA 138  K 4.1  CL 108  CO2 24  GLUCOSE 105*  BUN 14  CREATININE 1.16  CALCIUM 8.8*   PT/INR No results for input(s): LABPROT, INR in the last 72 hours.  Studies/Results: ECHOCARDIOGRAM COMPLETE  Result Date: 07/15/2019    ECHOCARDIOGRAM REPORT   Patient Name:   Anthony Vaughn Date of Exam: 07/15/2019 Medical Rec #:  [id number]        Height:       72.0 in Accession #:    [id number]       Weight:       167.5 lb Date of Birth:  [birth year]        BSA:          1.976 m Patient Age:    48 years         BP:           86/53 mmHg Patient Gender: M                HR:           65 bpm. [facility] Procedure: 2D Echo, Cardiac Doppler and Color Doppler Indications:    Abnormal ECG 794.31 / R94.31  History:        Patient has no prior history of Echocardiogram examinations.                 Risk  Factors:Hypertension. Sigmoid stricture                 (HCC),Diverticulitis of colon.  Sonographer:    Celesta Gentile RCS Referring Phys: [phone] [facility]  Sonographer Comments: Patient had abdomen surgery with bandages and a binder around abdomen IMPRESSIONS  1. Left ventricular ejection fraction, by estimation, is 40 to 45%. The left ventricle has mildly decreased function. The left ventricle demonstrates regional wall motion abnormalities (see scoring diagram/findings for description). There is mild left ventricular hypertrophy. Left ventricular diastolic parameters are consistent with Grade II diastolic dysfunction (pseudonormalization).  2. Right ventricular systolic function is normal. The right ventricular size is normal. There is normal pulmonary artery systolic pressure. The estimated right ventricular systolic pressure is 22.8 mmHg.  3. Left atrial size was mildly dilated.  4. The mitral valve is grossly normal. Mild mitral valve regurgitation.  5. The aortic valve is tricuspid. Aortic valve regurgitation is not visualized.  6. Aortic root is mildly ectatic. FINDINGS  Left Ventricle: Left ventricular ejection fraction, by estimation, is 40 to 45%. The left ventricle has mildly decreased function. The left ventricle demonstrates regional wall motion abnormalities. The left ventricular internal cavity size was normal in size. There is mild left ventricular hypertrophy. Left ventricular diastolic parameters are consistent with Grade II diastolic dysfunction (pseudonormalization).  LV Wall Scoring: The inferior wall is akinetic. The basal inferolateral segment and apical inferior segment are hypokinetic. Right Ventricle: The right ventricular size is normal. No increase in right ventricular wall thickness. Right ventricular systolic function is normal. There is normal pulmonary artery systolic pressure. The tricuspid regurgitant velocity is 1.79 m/s, and  with an assumed right atrial pressure of 10 mmHg,  the estimated right ventricular systolic pressure is 22.8 mmHg. Left Atrium: Left atrial size was mildly dilated. Right Atrium: Right atrial size was not well visualized. Pericardium: Trivial pericardial effusion is present. The pericardial effusion is posterior to the left ventricle. Mitral Valve: The mitral valve is grossly normal. There is mild thickening of the mitral valve leaflet(s). Mild mitral valve regurgitation. Tricuspid Valve: The tricuspid valve is grossly normal. Tricuspid valve regurgitation is mild. Aortic Valve: The aortic valve is tricuspid. Aortic valve regurgitation is not visualized. Pulmonic Valve: The pulmonic valve was grossly normal. Pulmonic valve regurgitation is not visualized. Aorta: Root is mildly ectatic. Venous: The inferior vena cava was not well visualized. IAS/Shunts: No atrial level shunt detected by color flow Doppler.  LEFT VENTRICLE PLAX 2D LVIDd:         5.53 cm      Diastology LVIDs:         4.26 cm      LV e' lateral:   6.31 cm/s LV PW:         1.26 cm      LV E/e' lateral: 11.0 LV IVS:        0.83 cm      LV e' medial:    7.07 cm/s LVOT diam:     2.10 cm      LV E/e' medial:  9.8 LV SV:         67 LV SV Index:   34 LVOT Area:     3.46 cm  LV Volumes (MOD) LV vol d, MOD A2C: 145.0 ml LV vol d, MOD A4C: 187.0 ml LV vol s, MOD A2C: 82.1 ml LV vol s, MOD A4C: 118.0 ml LV SV MOD A2C:     62.9 ml LV SV MOD A4C:     187.0 ml LV SV MOD BP:      69.9 ml RIGHT VENTRICLE RV S prime:     11.90 cm/s TAPSE (M-mode): 2.4 cm LEFT ATRIUM             Index       RIGHT ATRIUM           Index LA diam:        3.80 cm 1.92 cm/m  Anthony Area:     15.50 cm LA Vol (A2C):   80.2 ml 40.58 ml/m Anthony Volume:   37.70 ml  19.08 ml/m LA Vol (A4C):   70.4 ml 35.63 ml/m LA Biplane Vol: 77.4 ml 39.17 ml/m  AORTIC VALVE LVOT Vmax:   118.00 cm/s LVOT Vmean:  73.000 cm/s LVOT VTI:    0.194 m  AORTA Ao Root diam: 3.90 cm MITRAL VALVE  TRICUSPID VALVE MV Area (PHT): 4.31 cm    TR Peak grad:   12.8  mmHg MV Decel Time: 176 msec    TR Vmax:        179.00 cm/s MV E velocity: 69.50 cm/s MV A velocity: 50.90 cm/s  SHUNTS MV E/A ratio:  1.37        Systemic VTI:  0.19 m                            Systemic Diam: 2.10 cm Rozann Lesches MD Electronically signed by Rozann Lesches MD Signature Date/Time: 07/15/2019/12:46:39 PM    Final     Anti-infectives: Anti-infectives (From admission, onward)   Start     Dose/Rate Route Frequency Ordered Stop   07/14/19 2200  cefoTEtan (CEFOTAN) 2 g in sodium chloride 0.9 % 100 mL IVPB     2 g 200 mL/hr over 30 Minutes Intravenous Every 12 hours 07/14/19 1447 07/15/19 1057   07/14/19 0630  cefoTEtan (CEFOTAN) 2 g in sodium chloride 0.9 % 100 mL IVPB     2 g 200 mL/hr over 30 Minutes Intravenous On call to O.R. 07/14/19 3532 07/14/19 0815      Assessment/Plan: Anthony Vaughn is a 49 yo s/p laparoscopic left hemicolectomy. Doing well but did have BP issues overnight with hypertension that turned to hypotension. Likely from fluid shifts, anesthesia, etc. No cardiac event noted, troponins were reassuring, EKG reassuring.  ERAS for pain IS, OOB Now back on floor from stepdown Soft diet now was on clears, stopped entereg this PM Labs in AM, mag replaced SCDs, lovenox H&H drifting some, bleeding stopped with BMs  Appreciate hospitalist help.    LOS: 1 day    Virl Cagey 07/15/2019

## 2019-07-15 NOTE — Progress Notes (Signed)
Patient Demographics:    Anthony Vaughn, is a 49 y.o. male, DOB - 01-10-1971, NOM:[id number]  Admit date - 07/14/2019   Admitting Physician Neena Rhymes, MD  Outpatient Primary MD for the patient is Leonie Douglas, MD  LOS - 1   No chief complaint on file.       Subjective:    Anthony Vaughn today has no fevers, no emesis,  No chest pain,   -No shortness of breath, no dizziness no palpitations, tolerating clear liquid diet okay -Foley out, voiding, had BM  Assessment  & Plan :    Principal Problem:   Sigmoid stricture (HCC) Active Problems:   Diverticulitis of colon   Hypertension   Accelerated hypertension   Brief Summary:- -49 year old male with past medical history relevant tobacco abuse and  stage II hypertension who was on lisinopril 20 mg daily, Coreg 12.5 mg twice daily, and amlodipine 5 mg daily for BP control PTA admitted on 07/14/2018 to surgical service for sigmoid stricture secondary to recurrent diverticulitis requiring laparoscopic left hemicolectomy with splenic flexure takedown -Postop on 07/14/2019 patient developed significantly elevated BP with systolic blood pressure around 962 and diastolic blood pressure around 140 -He was transferred to stepdown unit and placed on a Cardene drip -Off Cardene drip since 3:30 AM, BP is now very soft with systolic blood pressure in the 80s to 90s  A/p 1)Hypertensive urgency -off Cardene drip, BP remains soft -Decrease Coreg to 6.25 mg twice daily with hold parameters -Continue to hold amlodipine and lisinopril - dropping BP may be partly due to aggressive BP control and also ABLA with hemoglobin down to 11.1.  From 13.2 preop ---Hydrate gently and monitor H&H -Echocardiogram pending  2) tobacco abuse--- cachexia strongly advised, nicotine patch as prescribed  3) Sigmoid colectomy/laparoscopic left hemicolectomy with splenic flexure  takedown  -Further management per Dr. Constance Haw -Currently on clear liquid diet  4)ABLA-- hemoglobin down to 11.1.  From 13.2 preop ---Monitor H&H closely   Disposition/Need for in-Hospital Stay- patient unable to be discharged at this time due to ---  hemodynamic instability initially with severely elevated BP now with low BP  Status is: Inpatient  Remains inpatient appropriate because:Hemodynamically unstable   Disposition: The patient is from: Home              Anticipated d/c is to: Home              Anticipated d/c date is: 1 day              Patient currently is not medically stable to d/c. Barriers: Not Clinically Stable- -hemodynamic instability initially with severely elevated BP now with low BP  Code Status : full  Family Communication:    NA (patient is alert, awake and coherent)   Consults  :  Gen surgery  DVT Prophylaxis  :  Lovenox -  - SCDs  Lab Results  Component Value Date   PLT 249 07/15/2019    Inpatient Medications  Scheduled Meds: . acetaminophen  1,000 mg Oral Q6H  . alvimopan  12 mg Oral BID  . carvedilol  6.25 mg Oral BID WC  . Chlorhexidine Gluconate Cloth  6 each Topical Daily  . enoxaparin (LOVENOX) injection  40 mg Subcutaneous  Q24H  . feeding supplement  237 mL Oral BID BM  . gabapentin  300 mg Oral BID  . ketorolac  30 mg Intravenous Q8H  . nicotine  14 mg Transdermal Daily   Continuous Infusions: . lactated ringers 75 mL/hr at 07/15/19 1057   PRN Meds:.diphenhydrAMINE **OR** diphenhydrAMINE, morphine injection, ondansetron **OR** ondansetron (ZOFRAN) IV, oxyCODONE, simethicone, zolpidem    Anti-infectives (From admission, onward)   Start     Dose/Rate Route Frequency Ordered Stop   07/14/19 2200  cefoTEtan (CEFOTAN) 2 g in sodium chloride 0.9 % 100 mL IVPB     2 g 200 mL/hr over 30 Minutes Intravenous Every 12 hours 07/14/19 1447 07/15/19 1057   07/14/19 0630  cefoTEtan (CEFOTAN) 2 g in sodium chloride 0.9 % 100 mL IVPB     2  g 200 mL/hr over 30 Minutes Intravenous On call to O.R. 07/14/19 0616 07/14/19 0815        Objective:   Vitals:   07/15/19 0424 07/15/19 0813 07/15/19 0900 07/15/19 1000  BP:  100/65 108/68 (!) 89/49  Pulse:  86 64 74  Resp:  18  18  Temp:      TempSrc:      SpO2:  100% 100% 100%  Weight: 76 kg     Height:        Wt Readings from Last 3 Encounters:  07/15/19 76 kg  07/11/19 68.5 kg  06/10/19 73 kg     Intake/Output Summary (Last 24 hours) at 07/15/2019 1102 Last data filed at 07/15/2019 0800 Gross per 24 hour  Intake 731.5 ml  Output 4350 ml  Net -3618.5 ml     Physical Exam  Gen:- Awake Alert,  In no apparent distress  HEENT:- [place].AT, No sclera icterus Neck-Supple Neck,No JVD,.  Lungs-  CTAB , fair symmetrical air movement CV- S1, S2 normal, regular  Abd-  +ve B.Sounds, Abd Soft, postop wound clean dry and intact, appropriate postop tenderness Extremity/Skin:- No  edema, pedal pulses present  Psych-affect is appropriate, oriented x3 Neuro-no new focal deficits, no tremors   Data Review:   Micro Results Recent Results (from the past 240 hour(s))  SARS CORONAVIRUS 2 (TAT 6-24 HRS) Nasopharyngeal Nasopharyngeal Swab     Status: None   Collection Time: 07/11/19  8:29 AM   Specimen: Nasopharyngeal Swab  Result Value Ref Range Status   SARS Coronavirus 2 NEGATIVE NEGATIVE Final    Comment: (NOTE) SARS-CoV-2 target nucleic acids are NOT DETECTED. The SARS-CoV-2 RNA is generally detectable in upper and lower respiratory specimens during the acute phase of infection. Negative results do not preclude SARS-CoV-2 infection, do not rule out co-infections with other pathogens, and should not be used as the sole basis for treatment or other patient management decisions. Negative results must be combined with clinical observations, patient history, and epidemiological information. The expected result is Negative. Fact Sheet for  Patients: HairSlick.no Fact Sheet for Healthcare Providers: quierodirigir.com This test is not yet approved or cleared by the [place] FDA and  has been authorized for detection and/or diagnosis of SARS-CoV-2 by FDA under an Emergency Use Authorization (EUA). This EUA will remain  in effect (meaning this test can be used) for the duration of the COVID-19 declaration under Section 56 4(b)(1) of the Act, 21 U.S.C. section 360bbb-3(b)(1), unless the authorization is terminated or revoked sooner. Performed at [facility], [address], [place], [place] [address]   MRSA PCR Screening     Status: None  Collection Time: 07/14/19  9:42 PM   Specimen: Nasopharyngeal  Result Value Ref Range Status   MRSA by PCR NEGATIVE NEGATIVE Final    Comment:        The GeneXpert MRSA Assay (FDA approved for NASAL specimens only), is one component of a comprehensive MRSA colonization surveillance program. It is not intended to diagnose MRSA infection nor to guide or monitor treatment for MRSA infections. Performed at [facility], [address], [place], [place] [address]     Radiology Reports No results found.   CBC Recent Labs  Lab 07/11/19 0856 07/15/19 0412  WBC 4.5 7.8  HGB 13.2 11.1*  HCT 40.1 34.1*  PLT 271 249  MCV 93.7 95.3  MCH 30.8 31.0  MCHC 32.9 32.6  RDW 12.9 13.1  LYMPHSABS 2.1 2.4  MONOABS 0.4 0.7  EOSABS 0.0 0.0  BASOSABS 0.0 0.0    Chemistries  Recent Labs  Lab 07/11/19 0856 07/15/19 0412  NA 139 138  K 4.0 4.1  CL 107 108  CO2 21* 24  GLUCOSE 92 105*  BUN 15 14  CREATININE 1.00 1.16  CALCIUM 9.7 8.8*  MG  --  1.6*   ------------------------------------------------------------------------------------------------------------------ No results for input(s): CHOL, HDL, LDLCALC, TRIG, CHOLHDL, LDLDIRECT in the last 72 hours.  No results found for:  HGBA1C ------------------------------------------------------------------------------------------------------------------ No results for input(s): TSH, T4TOTAL, T3FREE, THYROIDAB in the last 72 hours.  Invalid input(s): FREET3 ------------------------------------------------------------------------------------------------------------------ No results for input(s): VITAMINB12, FOLATE, FERRITIN, TIBC, IRON, RETICCTPCT in the last 72 hours.  Coagulation profile No results for input(s): INR, PROTIME in the last 168 hours.  No results for input(s): DDIMER in the last 72 hours.  Cardiac Enzymes No results for input(s): CKMB, TROPONINI, MYOGLOBIN in the last 168 hours.  Invalid input(s): CK ------------------------------------------------------------------------------------------------------------------ No results found for: BNP   Shon Hale M.D on 07/15/2019 at 11:02 AM  Go to [email or web address] - for contact info  [facility] - Office  [phone]

## 2019-07-16 ENCOUNTER — Encounter (HOSPITAL_COMMUNITY): Payer: Self-pay | Admitting: Internal Medicine

## 2019-07-16 DIAGNOSIS — I5022 Chronic systolic (congestive) heart failure: Secondary | ICD-10-CM

## 2019-07-16 LAB — LIPID PANEL
Cholesterol: 107 mg/dL (ref 0–200)
HDL: 34 mg/dL — ABNORMAL LOW (ref >40)
LDL Cholesterol: 54 mg/dL (ref 0–99)
Total CHOL/HDL Ratio: 3.1 RATIO
Triglycerides: 95 mg/dL (ref <150)
VLDL: 19 mg/dL (ref 0–40)

## 2019-07-16 LAB — CBC WITH DIFFERENTIAL/PLATELET
Abs Immature Granulocytes: 0.02 10*3/uL (ref 0.00–0.07)
Basophils Absolute: 0 10*3/uL (ref 0.0–0.1)
Basophils Relative: 0 %
Eosinophils Absolute: 0.1 10*3/uL (ref 0.0–0.5)
Eosinophils Relative: 2 %
HCT: 29.3 % — ABNORMAL LOW (ref 39.0–52.0)
Hemoglobin: 9.4 g/dL — ABNORMAL LOW (ref 13.0–17.0)
Immature Granulocytes: 0 %
Lymphocytes Relative: 32 %
Lymphs Abs: 1.8 10*3/uL (ref 0.7–4.0)
MCH: 30.9 pg (ref 26.0–34.0)
MCHC: 32.1 g/dL (ref 30.0–36.0)
MCV: 96.4 fL (ref 80.0–100.0)
Monocytes Absolute: 0.5 10*3/uL (ref 0.1–1.0)
Monocytes Relative: 10 %
Neutro Abs: 3.1 10*3/uL (ref 1.7–7.7)
Neutrophils Relative %: 56 %
Platelets: 187 10*3/uL (ref 150–400)
RBC: 3.04 MIL/uL — ABNORMAL LOW (ref 4.22–5.81)
RDW: 13.1 % (ref 11.5–15.5)
WBC: 5.5 10*3/uL (ref 4.0–10.5)
nRBC: 0 % (ref 0.0–0.2)

## 2019-07-16 LAB — BASIC METABOLIC PANEL
Anion gap: 5 (ref 5–15)
BUN: 13 mg/dL (ref 6–20)
CO2: 30 mmol/L (ref 22–32)
Calcium: 9 mg/dL (ref 8.9–10.3)
Chloride: 103 mmol/L (ref 98–111)
Creatinine, Ser: 0.96 mg/dL (ref 0.61–1.24)
GFR calc Af Amer: >60 mL/min (ref >60)
GFR calc non Af Amer: >60 mL/min (ref >60)
Glucose, Bld: 93 mg/dL (ref 70–99)
Potassium: 4 mmol/L (ref 3.5–5.1)
Sodium: 138 mmol/L (ref 135–145)

## 2019-07-16 LAB — MAGNESIUM: Magnesium: 1.8 mg/dL (ref 1.7–2.4)

## 2019-07-16 LAB — SURGICAL PATHOLOGY

## 2019-07-16 MED ORDER — KETOROLAC TROMETHAMINE 30 MG/ML IJ SOLN
30.0000 mg | Freq: Three times a day (TID) | INTRAMUSCULAR | Status: DC | PRN
  Administered 2019-07-16: 30 mg via INTRAVENOUS
  Filled 2019-07-16: qty 1

## 2019-07-16 MED ORDER — CARVEDILOL 3.125 MG PO TABS
6.2500 mg | ORAL_TABLET | Freq: Once | ORAL | Status: AC
  Administered 2019-07-16: 6.25 mg via ORAL
  Filled 2019-07-16: qty 2

## 2019-07-16 MED ORDER — MAGNESIUM SULFATE 2 GM/50ML IV SOLN
2.0000 g | Freq: Once | INTRAVENOUS | Status: AC
  Administered 2019-07-16: 2 g via INTRAVENOUS
  Filled 2019-07-16: qty 50

## 2019-07-16 MED ORDER — LISINOPRIL 10 MG PO TABS
10.0000 mg | ORAL_TABLET | Freq: Every day | ORAL | Status: DC
  Administered 2019-07-16: 10 mg via ORAL
  Filled 2019-07-16: qty 1

## 2019-07-16 MED ORDER — CARVEDILOL 12.5 MG PO TABS
25.0000 mg | ORAL_TABLET | Freq: Two times a day (BID) | ORAL | Status: DC
  Administered 2019-07-17: 25 mg via ORAL
  Filled 2019-07-16: qty 2

## 2019-07-16 NOTE — Progress Notes (Signed)
Stated ate good amount of soft diet breakfast with no pain or nausea.  Stated he thinks it actually made his stomach feel better.  Has been up ad lib and washed up. Surgical sites dry and intact.  ABD binder in place

## 2019-07-16 NOTE — Progress Notes (Signed)
[facility] Progress Note  2 Days Post-Op  Subjective: Having BMs, no blood in them now. H&H drifted. Pain controlled. Ambulating. BP is improved. Will need cardiology as outpatient.   Objective: Vital signs in last 24 hours: Temp:  [97.6 F (36.4 C)-98.6 F (37 C)] 98.3 F (36.8 C) (06/09 0449) Pulse Rate:  [64-91] 85 (06/09 0449) Resp:  [16-22] 20 (06/09 0449) BP: (97-151)/(64-114) 130/86 (06/09 0449) SpO2:  [99 %-100 %] 100 % (06/09 0449) Last BM Date: 07/15/19  Intake/Output from previous day: 06/08 0701 - 06/09 0700 In: 469.1 [P.O.:240; I.V.:79.1; IV Piggyback:150] Out: 1825 [Urine:1825] Intake/Output this shift: Total I/O In: 240 [P.O.:240] Out: -   General appearance: alert, cooperative and no distress Resp: normal work of breathing GI: soft, nondistended, appropriately tender, honeycomb with staple line c/d.i and no erythema or drainage  Lab Results:  Recent Labs    07/15/19 0412 07/16/19 0552  WBC 7.8 5.5  HGB 11.1* 9.4*  HCT 34.1* 29.3*  PLT 249 187   BMET Recent Labs    07/15/19 0412 07/16/19 0552  NA 138 138  K 4.1 4.0  CL 108 103  CO2 24 30  GLUCOSE 105* 93  BUN 14 13  CREATININE 1.16 0.96  CALCIUM 8.8* 9.0   PT/INR No results for input(s): LABPROT, INR in the last 72 hours.  Studies/Results: ECHOCARDIOGRAM COMPLETE  Result Date: 07/15/2019    ECHOCARDIOGRAM REPORT   Patient Name:   Anthony Vaughn Date of Exam: 07/15/2019 Medical Rec #:  [id number]        Height:       72.0 in Accession #:    [id number]       Weight:       167.5 lb Date of Birth:  12-Mar-1970        BSA:          1.976 m Patient Age:    48 years         BP:           86/53 mmHg Patient Gender: M                HR:           65 bpm. [facility] Procedure: 2D Echo, Cardiac Doppler and Color Doppler Indications:    Abnormal ECG 794.31 / R94.31  History:        Patient has no prior history of Echocardiogram examinations.                 Risk  Factors:Hypertension. Sigmoid stricture                 (HCC),Diverticulitis of colon.  Sonographer:    Celesta Gentile RCS Referring Phys: [phone] [facility]  Sonographer Comments: Patient had abdomen surgery with bandages and a binder around abdomen IMPRESSIONS  1. Left ventricular ejection fraction, by estimation, is 40 to 45%. The left ventricle has mildly decreased function. The left ventricle demonstrates regional wall motion abnormalities (see scoring diagram/findings for description). There is mild left ventricular hypertrophy. Left ventricular diastolic parameters are consistent with Grade II diastolic dysfunction (pseudonormalization).  2. Right ventricular systolic function is normal. The right ventricular size is normal. There is normal pulmonary artery systolic pressure. The estimated right ventricular systolic pressure is 22.8 mmHg.  3. Left atrial size was mildly dilated.  4. The mitral valve is grossly normal. Mild mitral valve regurgitation.  5. The aortic valve is tricuspid. Aortic valve regurgitation is not  visualized.  6. Aortic root is mildly ectatic. FINDINGS  Left Ventricle: Left ventricular ejection fraction, by estimation, is 40 to 45%. The left ventricle has mildly decreased function. The left ventricle demonstrates regional wall motion abnormalities. The left ventricular internal cavity size was normal in size. There is mild left ventricular hypertrophy. Left ventricular diastolic parameters are consistent with Grade II diastolic dysfunction (pseudonormalization).  LV Wall Scoring: The inferior wall is akinetic. The basal inferolateral segment and apical inferior segment are hypokinetic. Right Ventricle: The right ventricular size is normal. No increase in right ventricular wall thickness. Right ventricular systolic function is normal. There is normal pulmonary artery systolic pressure. The tricuspid regurgitant velocity is 1.79 m/s, and  with an assumed right atrial pressure of 10 mmHg,  the estimated right ventricular systolic pressure is 25.0 mmHg. Left Atrium: Left atrial size was mildly dilated. Right Atrium: Right atrial size was not well visualized. Pericardium: Trivial pericardial effusion is present. The pericardial effusion is posterior to the left ventricle. Mitral Valve: The mitral valve is grossly normal. There is mild thickening of the mitral valve leaflet(s). Mild mitral valve regurgitation. Tricuspid Valve: The tricuspid valve is grossly normal. Tricuspid valve regurgitation is mild. Aortic Valve: The aortic valve is tricuspid. Aortic valve regurgitation is not visualized. Pulmonic Valve: The pulmonic valve was grossly normal. Pulmonic valve regurgitation is not visualized. Aorta: Root is mildly ectatic. Venous: The inferior vena cava was not well visualized. IAS/Shunts: No atrial level shunt detected by color flow Doppler.  LEFT VENTRICLE PLAX 2D LVIDd:         5.53 cm      Diastology LVIDs:         4.26 cm      LV e' lateral:   6.31 cm/s LV PW:         1.26 cm      LV E/e' lateral: 11.0 LV IVS:        0.83 cm      LV e' medial:    7.07 cm/s LVOT diam:     2.10 cm      LV E/e' medial:  9.8 LV SV:         67 LV SV Index:   34 LVOT Area:     3.46 cm  LV Volumes (MOD) LV vol d, MOD A2C: 145.0 ml LV vol d, MOD A4C: 187.0 ml LV vol s, MOD A2C: 82.1 ml LV vol s, MOD A4C: 118.0 ml LV SV MOD A2C:     62.9 ml LV SV MOD A4C:     187.0 ml LV SV MOD BP:      69.9 ml RIGHT VENTRICLE RV S prime:     11.90 cm/s TAPSE (M-mode): 2.4 cm LEFT ATRIUM             Index       RIGHT ATRIUM           Index LA diam:        3.80 cm 1.92 cm/m  RA Area:     15.50 cm LA Vol (A2C):   80.2 ml 40.58 ml/m RA Volume:   37.70 ml  19.08 ml/m LA Vol (A4C):   70.4 ml 35.63 ml/m LA Biplane Vol: 77.4 ml 39.17 ml/m  AORTIC VALVE LVOT Vmax:   118.00 cm/s LVOT Vmean:  73.000 cm/s LVOT VTI:    0.194 m  AORTA Ao Root diam: 3.90 cm MITRAL VALVE  TRICUSPID VALVE MV Area (PHT): 4.31 cm    TR Peak grad:   12.8  mmHg MV Decel Time: 176 msec    TR Vmax:        179.00 cm/s MV E velocity: 69.50 cm/s MV A velocity: 50.90 cm/s  SHUNTS MV E/A ratio:  1.37        Systemic VTI:  0.19 m                            Systemic Diam: 2.10 cm Nona Dell MD Electronically signed by Nona Dell MD Signature Date/Time: 07/15/2019/12:46:39 PM    Final     Anti-infectives: Anti-infectives (From admission, onward)   Start     Dose/Rate Route Frequency Ordered Stop   07/14/19 2200  cefoTEtan (CEFOTAN) 2 g in sodium chloride 0.9 % 100 mL IVPB     2 g 200 mL/hr over 30 Minutes Intravenous Every 12 hours 07/14/19 1447 07/15/19 1057   07/14/19 0630  cefoTEtan (CEFOTAN) 2 g in sodium chloride 0.9 % 100 mL IVPB     2 g 200 mL/hr over 30 Minutes Intravenous On call to O.R. 07/14/19 8413 07/14/19 0815      Assessment/Plan: Mr. Raschke is POD 2 s/p laparoscopic left hemicolectomy. Doing well. HP improved now, ECHO with some CHF.  Tylenol scheduled  Toradol scheduled stopped  Narcotics PRN IS, OOB Soft diet Monitor H&H  HTN improved Will need cardiology follow up SCDs, lovenox    LOS: 2 days    Lucretia Roers 07/16/2019

## 2019-07-16 NOTE — Progress Notes (Signed)
Patient Demographics:    Anthony Vaughn, is a 49 y.o. male, DOB - [birth year], MOQ:[id number]  Admit date - 07/14/2019   Admitting Physician Neena Rhymes, MD  Outpatient Primary MD for the patient is Leonie Douglas, MD  LOS - 2   No chief complaint on file.       Subjective:    Anthony Vaughn today has no fevers, no emesis,  No chest pain,   -Tolerating oral intake better, BP running high -Had BMs  Assessment  & Plan :    Principal Problem:   Chronic HFrEF (heart failure with reduced ejection fraction) -combined diastolic and systolic dysfunction CHF-EF 40 to 45 % Active Problems:   Hypertension   S/P partial colectomy-for stricture secondary to recurrent diverticulitis   Diverticulitis of colon   Sigmoid stricture (HCC)   Nicotine abuse   Brief Summary:- -49 year old male with past medical history relevant tobacco abuse and  stage II hypertension who was on lisinopril 20 mg daily, Coreg 12.5 mg twice daily, and amlodipine 5 mg daily for BP control PTA admitted on 07/14/2018 to surgical service for sigmoid stricture secondary to recurrent diverticulitis requiring laparoscopic left hemicolectomy with splenic flexure takedown -Postop on 07/14/2019 patient developed significantly elevated BP with systolic blood pressure around 354 and diastolic blood pressure around 140 -He was transferred to stepdown unit and placed on a Cardene drip -Off Cardene drip since 3:30 AM, BP is now very soft with systolic blood pressure in the 80s to 90s  A/p 1)Hypertensive urgency -off Cardene drip,  -BP running much higher again, will increase Coreg to 25 mg p.o. twice daily, lisinopril 10 mg daily -Continue to hold amlodipine for now - dropping BP may be partly due to aggressive BP control and also ABLA with hemoglobin down to 9.4.1.  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 Domenic Polite -Patient will need cardiology follow-up and possible stress test/ischemia work-up as outpatient -Will need optimal dose of Coreg and lisinopril upon discharge -LDL is 54 HDL is 34 -No evidence of significant CHF exacerbation at this time -Low-salt diet advised  3) Sigmoid colectomy/laparoscopic left hemicolectomy with splenic flexure takedown  -Further management per Dr. Constance [facility] -Appears to be tolerating current diet well and having BM -Diet to be advanced per surgical team  4)ABLA-- hemoglobin down to 9.4.  From 13.2 preop ---Monitor H&H closely  5)Tobacco abuse--- smoking cessation strongly advised, nicotine patch as prescribed   Disposition/Need for in-Hospital Stay- patient unable to be discharged at this time due to ---  hemodynamic instability --BP continue to fluctuate - --Per general surgeon patient not medically ready for discharge from a bowel/colorectal standpoint  Status is: Inpatient  Remains inpatient appropriate because:Hemodynamically unstable   Disposition: The patient is from: Home              Anticipated d/c is to: Home              Anticipated d/c date is: 1 day              Patient currently is not medically stable to  d/c. Barriers: Not Clinically Stable- -hemodynamic instability --BP continue to fluctuate - --Per general surgeon patient not medically ready for discharge from a bowel/colorectal standpoint Code Status : full  Family Communication:    NA (patient is alert, awake and coherent)   Consults  :  Gen surgery  DVT Prophylaxis  :  Lovenox -  - SCDs  Lab Results  Component Value Date   PLT 187 07/16/2019    Inpatient Medications  Scheduled Meds: . acetaminophen  1,000 mg Oral Q6H  . [START ON 07/17/2019] carvedilol  25 mg Oral BID WC  . carvedilol  6.25 mg Oral Once  .  enoxaparin (LOVENOX) injection  40 mg Subcutaneous Q24H  . feeding supplement  237 mL Oral BID BM  . gabapentin  300 mg Oral BID  . lisinopril  10 mg Oral Daily  . nicotine  14 mg Transdermal Daily   Continuous Infusions:  PRN Meds:.diphenhydrAMINE **OR** diphenhydrAMINE, ketorolac, morphine injection, ondansetron **OR** ondansetron (ZOFRAN) IV, oxyCODONE, simethicone, zolpidem    Anti-infectives (From admission, onward)   Start     Dose/Rate Route Frequency Ordered Stop   07/14/19 2200  cefoTEtan (CEFOTAN) 2 g in sodium chloride 0.9 % 100 mL IVPB     2 g 200 mL/hr over 30 Minutes Intravenous Every 12 hours 07/14/19 1447 07/15/19 1057   07/14/19 0630  cefoTEtan (CEFOTAN) 2 g in sodium chloride 0.9 % 100 mL IVPB     2 g 200 mL/hr over 30 Minutes Intravenous On call to O.R. 07/14/19 0616 07/14/19 0815        Objective:   Vitals:   07/15/19 2146 07/16/19 0449 07/16/19 1352 07/16/19 1751  BP: (!) 141/98 130/86 (!) 146/97 (!) 176/111  Pulse: 89 85 (!) 101 90  Resp: 20 20 18    Temp: 98.6 F (37 C) 98.3 F (36.8 C) 98.2 F (36.8 C)   TempSrc: Oral Oral Oral   SpO2: 100% 100% 100%   Weight:      Height:        Wt Readings from Last 3 Encounters:  07/15/19 76 kg  07/11/19 68.5 kg  06/10/19 73 kg     Intake/Output Summary (Last 24 hours) at 07/16/2019 1811 Last data filed at 07/16/2019 1300 Gross per 24 hour  Intake 720 ml  Output --  Net 720 ml     Physical Exam  Gen:- Awake Alert,  In no apparent distress  HEENT:- [place].AT, No sclera icterus Neck-Supple Neck,No JVD,.  Lungs-  CTAB , fair symmetrical air movement CV- S1, S2 normal, regular  Abd-  +ve B.Sounds, Abd Soft, postop wound clean dry and intact, appropriate postop tenderness Extremity/Skin:- No  edema, pedal pulses present  Psych-affect is appropriate, oriented x3 Neuro-no new focal deficits, no tremors   Data Review:   Micro Results Recent Results (from the past 240 hour(s))  SARS CORONAVIRUS 2 (TAT  6-24 HRS) Nasopharyngeal Nasopharyngeal Swab     Status: None   Collection Time: 07/11/19  8:29 AM   Specimen: Nasopharyngeal Swab  Result Value Ref Range Status   SARS Coronavirus 2 NEGATIVE NEGATIVE Final    Comment: (NOTE) SARS-CoV-2 target nucleic acids are NOT DETECTED. The SARS-CoV-2 RNA is generally detectable in upper and lower respiratory specimens during the acute phase of infection. Negative results do not preclude SARS-CoV-2 infection, do not rule out co-infections with other pathogens, and should not be used as the sole basis for treatment or other patient management decisions. Negative results must be combined with  clinical observations, patient history, and epidemiological information. The expected result is Negative. Fact Sheet for Patients: HairSlick.no Fact Sheet for Healthcare Providers: quierodirigir.com This test is not yet approved or cleared by the [place] FDA and  has been authorized for detection and/or diagnosis of SARS-CoV-2 by FDA under an Emergency Use Authorization (EUA). This EUA will remain  in effect (meaning this test can be used) for the duration of the COVID-19 declaration under Section 56 4(b)(1) of the Act, 21 U.S.C. section 360bbb-3(b)(1), unless the authorization is terminated or revoked sooner. Performed at [facility], [address], [place], [place] [address]   MRSA PCR Screening     Status: None   Collection Time: 07/14/19  9:42 PM   Specimen: Nasopharyngeal  Result Value Ref Range Status   MRSA by PCR NEGATIVE NEGATIVE Final    Comment:        The GeneXpert MRSA Assay (FDA approved for NASAL specimens only), is one component of a comprehensive MRSA colonization surveillance program. It is not intended to diagnose MRSA infection nor to guide or monitor treatment for MRSA infections. Performed at [facility], [address], [place], [place] [address]      Radiology Reports ECHOCARDIOGRAM COMPLETE  Result Date: 07/15/2019    ECHOCARDIOGRAM REPORT   Patient Name:   Anthony Vaughn Date of Exam: 07/15/2019 Medical Rec #:  [id number]        Height:       72.0 in Accession #:    [id number]       Weight:       167.5 lb Date of Birth:  October 17, 1970        BSA:          1.976 m Patient Age:    48 years         BP:           86/53 mmHg Patient Gender: M                HR:           65 bpm. [facility] Procedure: 2D Echo, Cardiac Doppler and Color Doppler Indications:    Abnormal ECG 794.31 / R94.31  History:        Patient has no prior history of Echocardiogram examinations.                 Risk Factors:Hypertension. Sigmoid stricture                 (HCC),Diverticulitis of colon.  Sonographer:    Celesta Gentile RCS Referring Phys: [phone] [facility]  Sonographer Comments: Patient had abdomen surgery with bandages and a binder around abdomen IMPRESSIONS  1. Left ventricular ejection fraction, by estimation, is 40 to 45%. The left ventricle has mildly decreased function. The left ventricle demonstrates regional wall motion abnormalities (see scoring diagram/findings for description). There is mild left ventricular hypertrophy. Left ventricular diastolic parameters are consistent with Grade II diastolic dysfunction (pseudonormalization).  2. Right ventricular systolic function is normal. The right ventricular size is normal. There is normal pulmonary artery systolic pressure. The estimated right ventricular systolic pressure is 22.8 mmHg.  3. Left atrial size was mildly dilated.  4. The mitral valve is grossly normal. Mild mitral valve regurgitation.  5. The aortic valve is tricuspid. Aortic valve regurgitation is not visualized.  6. Aortic root is mildly ectatic. FINDINGS  Left Ventricle: Left ventricular ejection fraction, by estimation, is 40 to 45%. The left  ventricle has mildly decreased function. The left ventricle demonstrates regional wall motion  abnormalities. The left ventricular internal cavity size was normal in size. There is mild left ventricular hypertrophy. Left ventricular diastolic parameters are consistent with Grade II diastolic dysfunction (pseudonormalization).  LV Wall Scoring: The inferior wall is akinetic. The basal inferolateral segment and apical inferior segment are hypokinetic. Right Ventricle: The right ventricular size is normal. No increase in right ventricular wall thickness. Right ventricular systolic function is normal. There is normal pulmonary artery systolic pressure. The tricuspid regurgitant velocity is 1.79 m/s, and  with an assumed right atrial pressure of 10 mmHg, the estimated right ventricular systolic pressure is 22.8 mmHg. Left Atrium: Left atrial size was mildly dilated. Right Atrium: Right atrial size was not well visualized. Pericardium: Trivial pericardial effusion is present. The pericardial effusion is posterior to the left ventricle. Mitral Valve: The mitral valve is grossly normal. There is mild thickening of the mitral valve leaflet(s). Mild mitral valve regurgitation. Tricuspid Valve: The tricuspid valve is grossly normal. Tricuspid valve regurgitation is mild. Aortic Valve: The aortic valve is tricuspid. Aortic valve regurgitation is not visualized. Pulmonic Valve: The pulmonic valve was grossly normal. Pulmonic valve regurgitation is not visualized. Aorta: Root is mildly ectatic. Venous: The inferior vena cava was not well visualized. IAS/Shunts: No atrial level shunt detected by color flow Doppler.  LEFT VENTRICLE PLAX 2D LVIDd:         5.53 cm      Diastology LVIDs:         4.26 cm      LV e' lateral:   6.31 cm/s LV PW:         1.26 cm      LV E/e' lateral: 11.0 LV IVS:        0.83 cm      LV e' medial:    7.07 cm/s LVOT diam:     2.10 cm      LV E/e' medial:  9.8 LV SV:         67 LV SV Index:   34 LVOT Area:     3.46 cm  LV Volumes (MOD) LV vol d, MOD A2C: 145.0 ml LV vol d, MOD A4C: 187.0 ml LV vol s,  MOD A2C: 82.1 ml LV vol s, MOD A4C: 118.0 ml LV SV MOD A2C:     62.9 ml LV SV MOD A4C:     187.0 ml LV SV MOD BP:      69.9 ml RIGHT VENTRICLE RV S prime:     11.90 cm/s TAPSE (M-mode): 2.4 cm LEFT ATRIUM             Index       RIGHT ATRIUM           Index LA diam:        3.80 cm 1.92 cm/m  RA Area:     15.50 cm LA Vol (A2C):   80.2 ml 40.58 ml/m RA Volume:   37.70 ml  19.08 ml/m LA Vol (A4C):   70.4 ml 35.63 ml/m LA Biplane Vol: 77.4 ml 39.17 ml/m  AORTIC VALVE LVOT Vmax:   118.00 cm/s LVOT Vmean:  73.000 cm/s LVOT VTI:    0.194 m  AORTA Ao Root diam: 3.90 cm MITRAL VALVE               TRICUSPID VALVE MV Area (PHT): 4.31 cm    TR Peak grad:   12.8 mmHg MV Decel Time: 176  msec    TR Vmax:        179.00 cm/s MV E velocity: 69.50 cm/s MV A velocity: 50.90 cm/s  SHUNTS MV E/A ratio:  1.37        Systemic VTI:  0.19 m                            Systemic Diam: 2.10 cm Nona Dell MD Electronically signed by Nona Dell MD Signature Date/Time: 07/15/2019/12:46:39 PM    Final      CBC Recent Labs  Lab 07/11/19 8657 07/15/19 0412 07/16/19 0552  WBC 4.5 7.8 5.5  HGB 13.2 11.1* 9.4*  HCT 40.1 34.1* 29.3*  PLT 271 249 187  MCV 93.7 95.3 96.4  MCH 30.8 31.0 30.9  MCHC 32.9 32.6 32.1  RDW 12.9 13.1 13.1  LYMPHSABS 2.1 2.4 1.8  MONOABS 0.4 0.7 0.5  EOSABS 0.0 0.0 0.1  BASOSABS 0.0 0.0 0.0    Chemistries  Recent Labs  Lab 07/11/19 0856 07/15/19 0412 07/16/19 0552  NA 139 138 138  K 4.0 4.1 4.0  CL 107 108 103  CO2 21* 24 30  GLUCOSE 92 105* 93  BUN 15 14 13   CREATININE 1.00 1.16 0.96  CALCIUM 9.7 8.8* 9.0  MG  --  1.6* 1.8   ------------------------------------------------------------------------------------------------------------------ Recent Labs    07/16/19 0552  CHOL 107  HDL 34*  LDLCALC 54  TRIG 95  CHOLHDL 3.1    No results found for: HGBA1C ------------------------------------------------------------------------------------------------------------------ No  results for input(s): TSH, T4TOTAL, T3FREE, THYROIDAB in the last 72 hours.  Invalid input(s): FREET3 ------------------------------------------------------------------------------------------------------------------ No results for input(s): VITAMINB12, FOLATE, FERRITIN, TIBC, IRON, RETICCTPCT in the last 72 hours.  Coagulation profile No results for input(s): INR, PROTIME in the last 168 hours.  No results for input(s): DDIMER in the last 72 hours.  Cardiac Enzymes No results for input(s): CKMB, TROPONINI, MYOGLOBIN in the last 168 hours.  Invalid input(s): CK ------------------------------------------------------------------------------------------------------------------ No results found for: BNP   09/15/19 M.D on 07/16/2019 at 6:11 PM  Go to [email or web address] - for contact info  [facility] - Office  [phone]

## 2019-07-16 NOTE — Progress Notes (Addendum)
Has had 3 bms today with no blood but some solid pieces.  Has eaten good amount of lunch and supper trays with some increased pain after eating at one point rated a 7 but now pain about a 4.  Bp just now 176/111 and just gave scheduled coreg.  Texted Dr; Jayme Cloud about BP put in new orders.  Patient ambulated in hallway twice today

## 2019-07-17 LAB — CBC WITH DIFFERENTIAL/PLATELET
Abs Immature Granulocytes: 0.02 10*3/uL (ref 0.00–0.07)
Basophils Absolute: 0 10*3/uL (ref 0.0–0.1)
Basophils Relative: 0 %
Eosinophils Absolute: 0.1 10*3/uL (ref 0.0–0.5)
Eosinophils Relative: 2 %
HCT: 29.1 % — ABNORMAL LOW (ref 39.0–52.0)
Hemoglobin: 9.4 g/dL — ABNORMAL LOW (ref 13.0–17.0)
Immature Granulocytes: 0 %
Lymphocytes Relative: 34 %
Lymphs Abs: 1.6 10*3/uL (ref 0.7–4.0)
MCH: 30.7 pg (ref 26.0–34.0)
MCHC: 32.3 g/dL (ref 30.0–36.0)
MCV: 95.1 fL (ref 80.0–100.0)
Monocytes Absolute: 0.5 10*3/uL (ref 0.1–1.0)
Monocytes Relative: 11 %
Neutro Abs: 2.4 10*3/uL (ref 1.7–7.7)
Neutrophils Relative %: 53 %
Platelets: 205 10*3/uL (ref 150–400)
RBC: 3.06 MIL/uL — ABNORMAL LOW (ref 4.22–5.81)
RDW: 12.7 % (ref 11.5–15.5)
WBC: 4.6 10*3/uL (ref 4.0–10.5)
nRBC: 0 % (ref 0.0–0.2)

## 2019-07-17 MED ORDER — AMLODIPINE BESYLATE 5 MG PO TABS
5.0000 mg | ORAL_TABLET | Freq: Every day | ORAL | Status: DC
  Administered 2019-07-17: 5 mg via ORAL
  Filled 2019-07-17: qty 1

## 2019-07-17 MED ORDER — GABAPENTIN 300 MG PO CAPS: 300.0000 mg | ORAL_CAPSULE | Freq: Two times a day (BID) | ORAL | 4 refills | Status: DC

## 2019-07-17 MED ORDER — AMLODIPINE BESYLATE 10 MG PO TABS: 10.0000 mg | ORAL_TABLET | Freq: Every day | ORAL | 5 refills | Status: AC

## 2019-07-17 MED ORDER — SENNOSIDES-DOCUSATE SODIUM 8.6-50 MG PO TABS: 2.0000 | ORAL_TABLET | Freq: Every day | ORAL | 0 refills | Status: DC

## 2019-07-17 MED ORDER — CARVEDILOL 25 MG PO TABS: 25.0000 mg | ORAL_TABLET | Freq: Two times a day (BID) | ORAL | 5 refills | Status: DC

## 2019-07-17 MED ORDER — LISINOPRIL 10 MG PO TABS
20.0000 mg | ORAL_TABLET | Freq: Every day | ORAL | Status: DC
  Administered 2019-07-17: 20 mg via ORAL
  Filled 2019-07-17: qty 2

## 2019-07-17 MED ORDER — ACETAMINOPHEN 500 MG PO TABS: 1000.0000 mg | ORAL_TABLET | Freq: Four times a day (QID) | ORAL | 0 refills | Status: DC

## 2019-07-17 MED ORDER — ONDANSETRON HCL 4 MG PO TABS: 4.0000 mg | ORAL_TABLET | Freq: Four times a day (QID) | ORAL | 0 refills | Status: DC | PRN

## 2019-07-17 MED ORDER — LISINOPRIL 20 MG PO TABS
20.0000 mg | ORAL_TABLET | Freq: Every day | ORAL | 5 refills | Status: DC
Start: 2019-07-17 — End: 2019-08-06

## 2019-07-17 MED ORDER — NICOTINE 14 MG/24HR TD PT24: 14.0000 mg | MEDICATED_PATCH | Freq: Every day | TRANSDERMAL | 0 refills | Status: DC

## 2019-07-17 MED ORDER — OXYCODONE HCL 5 MG PO TABS: 5.0000 mg | ORAL_TABLET | ORAL | 0 refills | Status: DC | PRN

## 2019-07-17 NOTE — Discharge Instructions (Signed)
---1)Avoid ibuprofen/Advil/Aleve/Motrin/Goody Powders/Naproxen/BC powders/Meloxicam/Diclofenac/Indomethacin and other Nonsteroidal anti-inflammatory medications as these will make you more likely to bleed and can cause stomach ulcers, can also cause Kidney problems.   2) follow-up with cardiologist Dr. Dina Rich in 1 to 2 weeks as advised--- you might need a stress test as an outpatient to further evaluate your heart  3) follow-up with Dr. Henreitta Leber for general surgeon for postop wound check as previously advised  4) avoid lifting more than 20 pounds for the next 6 weeks  5) very low-salt diet advised due to high blood pressure  6) please check your blood pressure readings once or twice a day and keep a diary as discussed, take the blood pressure diary with you when you go to visit the cardiologist and your primary care physician  7)Total abstinence from tobacco advised--- you may use nicotine patch to help you quit smoking  8)You should get a CBC and a BMP blood test with your primary care physician within the next week or 2   Discharge Open Abdominal Surgery Instructions:  Common Complaints: Pain at the incision site is common. This will improve with time. Take your pain medications as described below. Some nausea is common and poor appetite. The main goal is to stay hydrated the first few days after surgery.   Diet/ Activity: Diet as tolerated. You have started and tolerated a diet in the hospital, and should continue to increase what you are able to eat.   You may not have a large appetite, but it is important to stay hydrated. Drink 64 ounces of water a day. Your appetite will return with time.  Keep a dry dressing in place over your staples daily or as needed. Some minor pink/ blood tinged drainage is expected. This will stop in a few days after surgery.  Shower per your regular routine daily.  Do not take hot showers. Take warm showers that are less than 10 minutes. Path the  incision dry. Wear an abdominal binder daily with activity. You do not have to wear this while sleeping or sitting.  Rest and listen to your body, but do not remain in bed all day.  Walk everyday for at least 15-20 minutes. Deep cough and move around every 1-2 hours in the first few days after surgery.  Do not lift > 10 lbs, perform excessive bending, pushing, pulling, squatting for 6-8 weeks after surgery.  The activity restrictions and the abdominal binder are to prevent hernia formation at your incision while you are healing.  Do not place lotions or balms on your incision unless instructed to specifically by Dr. Henreitta Leber.   Pain Expectations and Narcotics: -After surgery you will have pain associated with your incisions and this is normal. The pain is muscular and nerve pain, and will get better with time. -You are encouraged and expected to take non narcotic medications like tylenol and ibuprofen (when able) to treat pain as multiple modalities can aid with pain treatment. -Narcotics are only used when pain is severe or there is breakthrough pain. -You are not expected to have a pain score of 0 after surgery, as we cannot prevent pain. A pain score of 3-4 that allows you to be functional, move, walk, and tolerate some activity is the goal. The pain will continue to improve over the days after surgery and is dependent on your surgery. -Due to [place] law, we are only able to give a certain amount of pain medication to treat post operative pain, and we  only give additional narcotics on a patient by patient basis.  -For most laparoscopic surgery, studies have shown that the majority of patients only need 10-15 narcotic pills, and for open surgeries most patients only need 15-20.   -Having appropriate expectations of pain and knowledge of pain management with non narcotics is important as we do not want anyone to become addicted to narcotic pain medication.  -Using ice packs in the first 48 hours and  heating pads after 48 hours, wearing an abdominal binder (when recommended), and using over the counter medications are all ways to help with pain management.   -Simple acts like meditation and mindfulness practices after surgery can also help with pain control and research has proven the benefit of these practices.  Medication: Take tylenol and ibuprofen as needed for pain control, alternating every 4-6 hours.  Example:  Tylenol 1000mg  @ 6am, 12noon, 6pm, (Do not exceed 4000mg  of tylenol a day). Ibuprofen 800mg  @ 9am, 3pm, 9pm, 3am (Do not exceed 3600mg  of ibuprofen a day).  Take Roxicodone for breakthrough pain every 4 hours.  Take Colace for constipation related to narcotic pain medication. If you do not have a bowel movement in 2 days, take Miralax over the counter.  Drink plenty of water to also prevent constipation.   Contact Information: If you have questions or concerns, please call our office, [phone], Monday- Thursday 8AM-5PM and Friday 8AM-12Noon.  If it is after hours or on the weekend, please call [facility] Main Number, [phone], and ask to speak to the surgeon on call for Dr. Tuesday at [facility].    Laparoscopic Partial colectomy Colectomy, Care After This sheet gives you information about how to care for yourself after your procedure. Your health care provider may also give you more specific instructions. If you have problems or questions, contact your health care provider. What can I expect after the procedure? After your procedure, it is common to have the following:  Pain in your abdomen, especially in the incision areas. You will be given medicine to control the pain.  Tiredness. This is a normal part of the recovery process. Your energy level will return to normal over the next several weeks.  Changes in your bowel movements, such as constipation or needing to go more often. Talk with your health care provider about how to manage this. Follow these  instructions at home: Medicines  Take over-the-counter and prescription medicines only as told by your health care provider.  Do not drive or use heavy machinery while taking prescription pain medicine.  Do not drink alcohol while taking prescription pain medicine.  If you were prescribed an antibiotic medicine, use it as told by your health care provider. Do not stop using the antibiotic even if you start to feel better. Incision care   Follow instructions from your health care provider about how to take care of your incision areas. Make sure you: ? Keep your incisions clean and dry. ? Wash your hands with soap and water before and after applying medicine to the areas, and before and after changing your bandage (dressing). If soap and water are not available, use hand sanitizer. ? Change your dressing as told by your health care provider. ? Leave stitches (sutures), skin glue, or adhesive strips in place. These skin closures may need to stay in place for 2 weeks or longer. If adhesive strip edges start to loosen and curl up, you may trim the loose edges. Do not remove adhesive strips completely unless  your health care provider tells you to do that.  Do not wear tight clothing over the incisions. Tight clothing may rub and irritate the incision areas, which may cause the incisions to open.  Do not take baths, swim, or use a hot tub until your health care provider approves.   You may shower.  Check your incision area every day for signs of infection. Check for: ? More redness, swelling, or pain. ? More fluid or blood. ? Warmth. ? Pus or a bad smell. Activity  Avoid lifting anything that is heavier than 10 lb (4.5 kg) for 2 weeks or until your health care provider says it is okay.  You may resume normal activities as told by your health care provider. Ask your health care provider what activities are safe for you.  Take rest breaks during the day as needed. Eating and  drinking  Follow instructions from your health care provider about what you can eat after surgery.  To prevent or treat constipation while you are taking prescription pain medicine, your health care provider may recommend that you: ? Drink enough fluid to keep your urine clear or pale yellow. ? Take over-the-counter or prescription medicines. ? Eat foods that are high in fiber, such as fresh fruits and vegetables, whole grains, and beans. ? Limit foods that are high in fat and processed sugars, such as fried and sweet foods. General instructions  Ask your health care provider when you will need an appointment to get your sutures or staples removed.  Keep all follow-up visits as told by your health care provider. This is important. Contact a health care provider if:  You have more redness, swelling, or pain around your incisions.  You have more fluid or blood coming from the incisions.  Your incisions feel warm to the touch.  You have pus or a bad smell coming from your incisions or your dressing.  You have a fever.  You have an incision that breaks open (edges not staying together) after sutures or staples have been removed. Get help right away if:  You develop a rash.  You have chest pain or difficulty breathing.  You have pain or swelling in your legs.  You feel light-headed or you faint.  Your abdomen swells (becomes distended).  You have nausea or vomiting.  You have blood in your stool (feces).   - 1)Avoid ibuprofen/Advil/Aleve/Motrin/Goody Powders/Naproxen/BC powders/Meloxicam/Diclofenac/Indomethacin and other Nonsteroidal anti-inflammatory medications as these will make you more likely to bleed and can cause stomach ulcers, can also cause Kidney problems.   2) follow-up with cardiologist Dr. Carlyle Dolly in 1 to 2 weeks as advised--- you might need a stress test as an outpatient to further evaluate your heart  3) follow-up with Dr. Constance Haw for general  surgeon for postop wound check as previously advised  4) avoid lifting more than 20 pounds for the next 6 weeks  5) very low-salt diet advised due to high blood pressure  6) please check your blood pressure readings once or twice a day and keep a diary as discussed, take the blood pressure diary with you when you go to visit the cardiologist and your primary care physician  7)Total abstinence from tobacco advised--- you may use nicotine patch to help you quit smoking  8)You should get a CBC and a BMP blood test with your primary care physician within the next week or 2

## 2019-07-17 NOTE — Progress Notes (Signed)
[facility] Progress Note  3 Days Post-Op  Subjective: Having Bm. H& H stable. No complaints. Pain controlled. Ambulating.   Objective: Vital signs in last 24 hours: Temp:  [98.2 F (36.8 C)-98.5 F (36.9 C)] 98.5 F (36.9 C) (06/09 2139) Pulse Rate:  [84-101] 84 (06/09 2139) Resp:  [16-18] 16 (06/09 2139) BP: (146-176)/(97-113) 150/113 (06/09 2139) SpO2:  [100 %] 100 % (06/09 2139) Weight:  [71.2 kg] 71.2 kg (06/10 0500) Last BM Date: 07/16/19  Intake/Output from previous day: 06/09 0701 - 06/10 0700 In: 720 [P.O.:720] Out: -  Intake/Output this shift: No intake/output data recorded.  General appearance: alert, cooperative and no distress Resp: normal work of breathing GI: soft, nondistended, appropriately tender, staples c/d/i without erythema or drainage  Lab Results:  Recent Labs    07/16/19 0552 07/17/19 0555  WBC 5.5 4.6  HGB 9.4* 9.4*  HCT 29.3* 29.1*  PLT 187 205   BMET Recent Labs    07/15/19 0412 07/16/19 0552  NA 138 138  K 4.1 4.0  CL 108 103  CO2 24 30  GLUCOSE 105* 93  BUN 14 13  CREATININE 1.16 0.96  CALCIUM 8.8* 9.0   PT/INR No results for input(s): LABPROT, INR in the last 72 hours.  Studies/Results: ECHOCARDIOGRAM COMPLETE  Result Date: 07/15/2019    ECHOCARDIOGRAM REPORT   Patient Name:   Anthony Vaughn Date of Exam: 07/15/2019 Medical Rec #:  [id number]        Height:       72.0 in Accession #:    [id number]       Weight:       167.5 lb Date of Birth:  30-Mar-1970        BSA:          1.976 m Patient Age:    49 years         BP:           86/53 mmHg Patient Gender: M                HR:           65 bpm. Exam Location:  [facility] Procedure: 2D Echo, Cardiac Doppler and Color Doppler Indications:    Abnormal ECG 794.31 / R94.31  History:        Patient has no prior history of Echocardiogram examinations.                 Risk Factors:Hypertension. Sigmoid stricture                 (HCC),Diverticulitis of colon.   Sonographer:    Alvino Chapel RCS Referring Phys: [id number] [employer]  Sonographer Comments: Patient had abdomen surgery with bandages and a binder around abdomen IMPRESSIONS  1. Left ventricular ejection fraction, by estimation, is 40 to 45%. The left ventricle has mildly decreased function. The left ventricle demonstrates regional wall motion abnormalities (see scoring diagram/findings for description). There is mild left ventricular hypertrophy. Left ventricular diastolic parameters are consistent with Grade II diastolic dysfunction (pseudonormalization).  2. Right ventricular systolic function is normal. The right ventricular size is normal. There is normal pulmonary artery systolic pressure. The estimated right ventricular systolic pressure is 62.3 mmHg.  3. Left atrial size was mildly dilated.  4. The mitral valve is grossly normal. Mild mitral valve regurgitation.  5. The aortic valve is tricuspid. Aortic valve regurgitation is not visualized.  6. Aortic root is mildly ectatic. FINDINGS  Left Ventricle: Left ventricular  ejection fraction, by estimation, is 40 to 45%. The left ventricle has mildly decreased function. The left ventricle demonstrates regional wall motion abnormalities. The left ventricular internal cavity size was normal in size. There is mild left ventricular hypertrophy. Left ventricular diastolic parameters are consistent with Grade II diastolic dysfunction (pseudonormalization).  LV Wall Scoring: The inferior wall is akinetic. The basal inferolateral segment and apical inferior segment are hypokinetic. Right Ventricle: The right ventricular size is normal. No increase in right ventricular wall thickness. Right ventricular systolic function is normal. There is normal pulmonary artery systolic pressure. The tricuspid regurgitant velocity is 1.79 m/s, and  with an assumed right atrial pressure of 10 mmHg, the estimated right ventricular systolic pressure is 22.8 mmHg. Left Atrium: Left  atrial size was mildly dilated. Right Atrium: Right atrial size was not well visualized. Pericardium: Trivial pericardial effusion is present. The pericardial effusion is posterior to the left ventricle. Mitral Valve: The mitral valve is grossly normal. There is mild thickening of the mitral valve leaflet(s). Mild mitral valve regurgitation. Tricuspid Valve: The tricuspid valve is grossly normal. Tricuspid valve regurgitation is mild. Aortic Valve: The aortic valve is tricuspid. Aortic valve regurgitation is not visualized. Pulmonic Valve: The pulmonic valve was grossly normal. Pulmonic valve regurgitation is not visualized. Aorta: Root is mildly ectatic. Venous: The inferior vena cava was not well visualized. IAS/Shunts: No atrial level shunt detected by color flow Doppler.  LEFT VENTRICLE PLAX 2D LVIDd:         5.53 cm      Diastology LVIDs:         4.26 cm      LV e' lateral:   6.31 cm/s LV PW:         1.26 cm      LV E/e' lateral: 11.0 LV IVS:        0.83 cm      LV e' medial:    7.07 cm/s LVOT diam:     2.10 cm      LV E/e' medial:  9.8 LV SV:         67 LV SV Index:   34 LVOT Area:     3.46 cm  LV Volumes (MOD) LV vol d, MOD A2C: 145.0 ml LV vol d, MOD A4C: 187.0 ml LV vol s, MOD A2C: 82.1 ml LV vol s, MOD A4C: 118.0 ml LV SV MOD A2C:     62.9 ml LV SV MOD A4C:     187.0 ml LV SV MOD BP:      69.9 ml RIGHT VENTRICLE RV S prime:     11.90 cm/s TAPSE (M-mode): 2.4 cm LEFT ATRIUM             Index       RIGHT ATRIUM           Index LA diam:        3.80 cm 1.92 cm/m  RA Area:     15.50 cm LA Vol (A2C):   80.2 ml 40.58 ml/m RA Volume:   37.70 ml  19.08 ml/m LA Vol (A4C):   70.4 ml 35.63 ml/m LA Biplane Vol: 77.4 ml 39.17 ml/m  AORTIC VALVE LVOT Vmax:   118.00 cm/s LVOT Vmean:  73.000 cm/s LVOT VTI:    0.194 m  AORTA Ao Root diam: 3.90 cm MITRAL VALVE               TRICUSPID VALVE MV Area (PHT): 4.31 cm    TR Peak  grad:   12.8 mmHg MV Decel Time: 176 msec    TR Vmax:        179.00 cm/s MV E velocity: 69.50  cm/s MV A velocity: 50.90 cm/s  SHUNTS MV E/A ratio:  1.37        Systemic VTI:  0.19 m                            Systemic Diam: 2.10 cm Nona Dell MD Electronically signed by Nona Dell MD Signature Date/Time: 07/15/2019/12:46:39 PM    Final     Anti-infectives: Anti-infectives (From admission, onward)   Start     Dose/Rate Route Frequency Ordered Stop   07/14/19 2200  cefoTEtan (CEFOTAN) 2 g in sodium chloride 0.9 % 100 mL IVPB        2 g 200 mL/hr over 30 Minutes Intravenous Every 12 hours 07/14/19 1447 07/15/19 1057   07/14/19 0630  cefoTEtan (CEFOTAN) 2 g in sodium chloride 0.9 % 100 mL IVPB        2 g 200 mL/hr over 30 Minutes Intravenous On call to O.R. 07/14/19 3825 07/14/19 0815      Assessment/Plan: Anthony Vaughn is a 49 yo s/p Lap left hemicolectomy for diverticulitis. Doing well.  PRN for pain Soft diet Bowel regimen H&H stabilized Going home Follow up with PCP and Cards per Hospitalist See me next week to get staples out Call /go to ED with worsening pain, fevers, nausea/vomiting    LOS: 3 days    Anthony Vaughn 07/17/2019

## 2019-07-17 NOTE — Discharge Summary (Signed)
Anthony [facility]n, is a 49 y.o. male  DOB September 17, 1970  MRN [id number].  Admission date:  07/14/2019  Admitting Physician  Anthony Navy, MD  Discharge Date:  07/17/2019   Primary MD  Anthony Reining, MD  Recommendations for primary care physician for things to follow:   1)Avoid ibuprofen/Advil/Aleve/Motrin/Goody Powders/Naproxen/BC powders/Meloxicam/Diclofenac/Indomethacin and other Nonsteroidal anti-inflammatory medications as these will make you more likely to bleed and can cause stomach ulcers, can also cause Kidney problems.   2) follow-up with cardiologist Dr. Dina Rich in 1 to 2 weeks as advised--- you might need a stress test as an outpatient to further evaluate your heart  3) follow-up with Dr. Henreitta Leber for general surgeon for postop wound check as previously advised  4) avoid lifting more than 20 pounds for the next 6 weeks  5) very low-salt diet advised due to high blood pressure  6) please check your blood pressure readings once or twice a day and keep a diary as discussed, take the blood pressure diary with you when you go to visit the cardiologist and your primary care physician  7)Total abstinence from tobacco advised--- you may use nicotine patch to help you quit smoking  8)You should get a CBC and a BMP blood test with your primary care physician within the next week or 2   Admission Diagnosis  Sigmoid stricture (HCC) [K56.699] Accelerated hypertension [I10]   Discharge Diagnosis  Sigmoid stricture (HCC) [K56.699] Accelerated hypertension [I10]    Principal Problem:   Chronic HFrEF (heart failure with reduced ejection fraction) -combined diastolic and systolic dysfunction CHF-EF 40 to 45 % Active Problems:   Hypertension   S/P partial colectomy-for stricture secondary to recurrent diverticulitis   Diverticulitis of colon   Sigmoid stricture (HCC)   Nicotine abuse       Past Medical History:  Diagnosis Date  . Arthritis   . Diverticulitis   . GERD (gastroesophageal reflux disease)   . HTN (hypertension)     Past Surgical History:  Procedure Laterality Date  . BIOPSY  03/04/2019   Procedure: BIOPSY;  Surgeon: Corbin Ade, MD;  [facility];  Service: Endoscopy;;  gastric  . COLONOSCOPY    . COLONOSCOPY WITH PROPOFOL N/A 03/04/2019   Procedure: COLONOSCOPY WITH PROPOFOL;  Surgeon: Corbin Ade, MD;  [facility];  Service: Endoscopy;  Laterality: N/A;  7:30am  . ESOPHAGOGASTRODUODENOSCOPY (EGD) WITH PROPOFOL N/A 03/04/2019   Procedure: ESOPHAGOGASTRODUODENOSCOPY (EGD) WITH PROPOFOL;  Surgeon: Corbin Ade, MD;  [facility];  Service: Endoscopy;  Laterality: N/A;  . LAPAROSCOPIC PARTIAL COLECTOMY N/A 07/14/2019   Procedure: LAPAROSCOPIC  LEFT HEMICOLECTOMY;  Surgeon: Lucretia Roers, MD;  Location: [facility];  Service: General;  Laterality: N/A;  . NO PAST SURGERIES        HPI  from the history and physical done on the day of admission:   -- Interval H&P this AM attached to progress note in clinic. This is the H&P for this visit.   [facility] History and Physical  Reason for Referral: Sigmoid stricture  ---   Anthony Vaughn is a 49 y.o. male.  HPI: Mr. Seipp is a 49 yo that has a history of diverticulitis and multiple episodes in the last year with abdominal pain that comes on about monthly. He has been doing better clinically but we discussed repeat imaging the last time I saw him, and repeat CT demonstrates this continued strictured area seen on CT. He currently is eating and drinking and having no abdominal pain. He has regular BMs. He understands that this stricture will not improve and will continue to worsening and cause more narrowing with time.      [facility] Course:     Brief Summary:- -49 year old male with past medical history relevant tobacco abuse and  stage II  hypertension who was on lisinopril 20 mg daily, Coreg 12.5 mg twice daily, and amlodipine 5 mg daily for BP control PTA admitted on 07/14/2018 to surgical service for sigmoid stricture secondary to recurrent diverticulitis requiring laparoscopic left hemicolectomy with splenic flexure takedown -Postop on 07/14/2019 patient developed significantly elevated BP with systolic blood pressure around 200 and diastolic blood pressure around 140 -He was transferred to stepdown unit and placed on a Cardene drip -Off Cardene drip since 3:30 AM, BP is now very soft with systolic blood pressure in the 80s to 90s  A/p 1)Hypertensive urgency -off Cardene drip,  -BP meds adjusted as follows, amlodipine 10 mg daily, Coreg 25 mg twice daily and lisinopril 20 mg daily -Low-salt diet and smoking cessation advised, outpatient follow-up with cardiology advised  2)HFrEF--patient with combined systolic and diastolic dysfunction CHF with EF in the 40 to 45% range --EKG and echo with 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 Diona Browner -Patient will need cardiology follow-up and possible stress test/ischemia work-up as outpatient -Cardiac medications as above #1 -LDL is 54 HDL is 34 -No evidence of significant CHF exacerbation at this time -Low-salt diet advised  3) Sigmoid colectomy/laparoscopic left hemicolectomy with splenic flexure takedown  -Further management per Dr. Henreitta Leber -Had BM and tolerating oral intake well -Diet  advanced per surgical team  4)ABLA-- hemoglobin down to 9.4.  From 13.2 preop -Repeat CBC in about a week advised  5)Tobacco abuse--- smoking cessation strongly advised, nicotine patch as prescribed   Disposition-Home  Disposition: The patient is from: Home  Anticipated d/c is to: Home  Code Status : full  Family Communication:    NA (patient is alert, awake and coherent)   Consults  :  Gen surgery  Discharge  Condition: stable  Follow UP   Follow-up Information    Anthony Poche, MD. Schedule an appointment as soon as possible for a visit in 1 week(s).   Specialty: Cardiology Why: Needs Ischemia work- up ----see echo report Contact information: [address] [place] [address] [phone]        Lucretia Roers, MD Follow up on 07/24/2019.   Specialty: General Surgery Why: staple removal Contact information: [address] [place] [facility] [address] [phone]               Diet and Activity recommendation:  As advised  Discharge Instructions    Discharge Instructions    Call MD for:  difficulty breathing, headache or visual disturbances   Complete by: As directed    Call MD for:  persistant dizziness or light-headedness   Complete by: As directed    Call MD for:  persistant  nausea and vomiting   Complete by: As directed    Call MD for:  severe uncontrolled pain   Complete by: As directed    Call MD for:  temperature >100.4   Complete by: As directed    Diet - low sodium heart healthy   Complete by: As directed    Discharge instructions   Complete by: As directed    1)Avoid ibuprofen/Advil/Aleve/Motrin/Goody Powders/Naproxen/BC powders/Meloxicam/Diclofenac/Indomethacin and other Nonsteroidal anti-inflammatory medications as these will make you more likely to bleed and can cause stomach ulcers, can also cause Kidney problems.   2) follow-up with cardiologist Dr. Dina Rich in 1 to 2 weeks as advised--- you might need a stress test as an outpatient to further evaluate your heart  3) follow-up with Dr. Henreitta Leber for general surgeon for postop wound check as previously advised  4) avoid lifting more than 20 pounds for the next 6 weeks  5) very low-salt diet advised due to high blood pressure  6) please check your blood pressure readings once or twice a day and keep a diary as discussed, take the blood pressure diary with you when you go to visit the  cardiologist and your primary care physician  7)Total abstinence from tobacco advised--- you may use nicotine patch to help you quit smoking  8)You should get a CBC and a BMP blood test with your primary care physician within the next week or 2   Discharge wound care:   Complete by: As directed    As advised by a general surgeon   Increase activity slowly   Complete by: As directed    Avoid lifting more than 20 pounds for the next 6 weeks        Discharge Medications     Allergies as of 07/17/2019      Reactions   Other    States he's allergic to a blood pressure pill that causes abdominal pain. He's not sure name of medication      Medication List    STOP taking these medications   docusate sodium 100 MG capsule Commonly known as: Colace     TAKE these medications   acetaminophen 500 MG tablet Commonly known as: TYLENOL Take 2 tablets (1,000 mg total) by mouth every 6 (six) hours.   amLODipine 10 MG tablet Commonly known as: NORVASC Take 1 tablet (10 mg total) by mouth daily. For Blood Pressure What changed:   medication strength  how much to take  additional instructions   BENEFIBER DRINK MIX PO Take by mouth See admin instructions. Takes 2 tablespoons daily as needed for supplement   carvedilol 25 MG tablet Commonly known as: COREG Take 1 tablet (25 mg total) by mouth 2 (two) times daily with a meal. What changed:   medication strength  how much to take   gabapentin 300 MG capsule Commonly known as: NEURONTIN Take 1 capsule (300 mg total) by mouth 2 (two) times daily.   lisinopril 20 MG tablet Commonly known as: ZESTRIL Take 1 tablet (20 mg total) by mouth daily.   nicotine 14 mg/24hr patch Commonly known as: NICODERM CQ - dosed in mg/24 hours Place 1 patch (14 mg total) onto the skin daily. Start taking on: July 18, 2019   ondansetron 4 MG tablet Commonly known as: ZOFRAN Take 1 tablet (4 mg total) by mouth every 6 (six) hours as needed for  nausea or vomiting.   oxyCODONE 5 MG immediate release tablet Commonly known as: Oxy IR/ROXICODONE Take 1 tablet (  5 mg total) by mouth every 4 (four) hours as needed for severe pain or breakthrough pain.   senna-docusate 8.6-50 MG tablet Commonly known as: Senokot-S Take 2 tablets by mouth at bedtime.            Discharge Care Instructions  (From admission, onward)         Start     Ordered   07/17/19 0000  Discharge wound care:       Comments: As advised by a general surgeon   07/17/19 1040         Major procedures and Radiology Reports - PLEASE review detailed and final reports for all details, in brief -    ECHOCARDIOGRAM COMPLETE  Result Date: 07/15/2019    ECHOCARDIOGRAM REPORT   Patient Name:   Anthony [facility]n Date of Exam: 07/15/2019 Medical Rec #:  [id number]        Height:       72.0 in Accession #:    [id number]       Weight:       167.5 lb Date of Birth:  04/27/1970        BSA:          1.976 m Patient Age:    48 years         BP:           86/53 mmHg Patient Gender: M                HR:           65 bpm. [facility] Procedure: 2D Echo, Cardiac Doppler and Color Doppler Indications:    Abnormal ECG 794.31 / R94.31  History:        Patient has no prior history of Echocardiogram examinations.                 Risk Factors:Hypertension. Sigmoid stricture                 (HCC),Diverticulitis of colon.  Sonographer:    Celesta Gentile RCS Referring Phys: [phone] [facility]  Sonographer Comments: Patient had abdomen surgery with bandages and a binder around abdomen IMPRESSIONS  1. Left ventricular ejection fraction, by estimation, is 40 to 45%. The left ventricle has mildly decreased function. The left ventricle demonstrates regional wall motion abnormalities (see scoring diagram/findings for description). There is mild left ventricular hypertrophy. Left ventricular diastolic parameters are consistent with Grade II diastolic dysfunction (pseudonormalization).  2.  Right ventricular systolic function is normal. The right ventricular size is normal. There is normal pulmonary artery systolic pressure. The estimated right ventricular systolic pressure is 22.8 mmHg.  3. Left atrial size was mildly dilated.  4. The mitral valve is grossly normal. Mild mitral valve regurgitation.  5. The aortic valve is tricuspid. Aortic valve regurgitation is not visualized.  6. Aortic root is mildly ectatic. FINDINGS  Left Ventricle: Left ventricular ejection fraction, by estimation, is 40 to 45%. The left ventricle has mildly decreased function. The left ventricle demonstrates regional wall motion abnormalities. The left ventricular internal cavity size was normal in size. There is mild left ventricular hypertrophy. Left ventricular diastolic parameters are consistent with Grade II diastolic dysfunction (pseudonormalization).  LV Wall Scoring: The inferior wall is akinetic. The basal inferolateral segment and apical inferior segment are hypokinetic. Right Ventricle: The right ventricular size is normal. No increase in right ventricular wall thickness. Right ventricular systolic function is normal. There is normal pulmonary artery systolic pressure. The  tricuspid regurgitant velocity is 1.79 m/s, and  with an assumed right atrial pressure of 10 mmHg, the estimated right ventricular systolic pressure is 15.4 mmHg. Left Atrium: Left atrial size was mildly dilated. Right Atrium: Right atrial size was not well visualized. Pericardium: Trivial pericardial effusion is present. The pericardial effusion is posterior to the left ventricle. Mitral Valve: The mitral valve is grossly normal. There is mild thickening of the mitral valve leaflet(s). Mild mitral valve regurgitation. Tricuspid Valve: The tricuspid valve is grossly normal. Tricuspid valve regurgitation is mild. Aortic Valve: The aortic valve is tricuspid. Aortic valve regurgitation is not visualized. Pulmonic Valve: The pulmonic valve was grossly  normal. Pulmonic valve regurgitation is not visualized. Aorta: Root is mildly ectatic. Venous: The inferior vena cava was not well visualized. IAS/Shunts: No atrial level shunt detected by color flow Doppler.  LEFT VENTRICLE PLAX 2D LVIDd:         5.53 cm      Diastology LVIDs:         4.26 cm      LV e' lateral:   6.31 cm/s LV PW:         1.26 cm      LV E/e' lateral: 11.0 LV IVS:        0.83 cm      LV e' medial:    7.07 cm/s LVOT diam:     2.10 cm      LV E/e' medial:  9.8 LV SV:         67 LV SV Index:   34 LVOT Area:     3.46 cm  LV Volumes (MOD) LV vol d, MOD A2C: 145.0 ml LV vol d, MOD A4C: 187.0 ml LV vol s, MOD A2C: 82.1 ml LV vol s, MOD A4C: 118.0 ml LV SV MOD A2C:     62.9 ml LV SV MOD A4C:     187.0 ml LV SV MOD BP:      69.9 ml RIGHT VENTRICLE RV S prime:     11.90 cm/s TAPSE (M-mode): 2.4 cm LEFT ATRIUM             Index       RIGHT ATRIUM           Index LA diam:        3.80 cm 1.92 cm/m  RA Area:     15.50 cm LA Vol (A2C):   80.2 ml 40.58 ml/m RA Volume:   37.70 ml  19.08 ml/m LA Vol (A4C):   70.4 ml 35.63 ml/m LA Biplane Vol: 77.4 ml 39.17 ml/m  AORTIC VALVE LVOT Vmax:   118.00 cm/s LVOT Vmean:  73.000 cm/s LVOT VTI:    0.194 m  AORTA Ao Root diam: 3.90 cm MITRAL VALVE               TRICUSPID VALVE MV Area (PHT): 4.31 cm    TR Peak grad:   12.8 mmHg MV Decel Time: 176 msec    TR Vmax:        179.00 cm/s MV E velocity: 69.50 cm/s MV A velocity: 50.90 cm/s  SHUNTS MV E/A ratio:  1.37        Systemic VTI:  0.19 m                            Systemic Diam: 2.10 cm Rozann Lesches MD Electronically signed by Rozann Lesches MD Signature Date/Time: 07/15/2019/12:46:39 PM  Final     Micro Results    Recent Results (from the past 240 hour(s))  SARS CORONAVIRUS 2 (TAT 6-24 HRS) Nasopharyngeal Nasopharyngeal Swab     Status: None   Collection Time: 07/11/19  8:29 AM   Specimen: Nasopharyngeal Swab  Result Value Ref Range Status   SARS Coronavirus 2 NEGATIVE NEGATIVE Final    Comment:  (NOTE) SARS-CoV-2 target nucleic acids are NOT DETECTED. The SARS-CoV-2 RNA is generally detectable in upper and lower respiratory specimens during the acute phase of infection. Negative results do not preclude SARS-CoV-2 infection, do not rule out co-infections with other pathogens, and should not be used as the sole basis for treatment or other patient management decisions. Negative results must be combined with clinical observations, patient history, and epidemiological information. The expected result is Negative. Fact Sheet for Patients: HairSlick.no Fact Sheet for Healthcare Providers: quierodirigir.com This test is not yet approved or cleared by the [place] FDA and  has been authorized for detection and/or diagnosis of SARS-CoV-2 by FDA under an Emergency Use Authorization (EUA). This EUA will remain  in effect (meaning this test can be used) for the duration of the COVID-19 declaration under Section 56 4(b)(1) of the Act, 21 U.S.C. section 360bbb-3(b)(1), unless the authorization is terminated or revoked sooner. Performed at [facility], [address] [place], [place] [address]   MRSA PCR Screening     Status: None   Collection Time: 07/14/19  9:42 PM   Specimen: Nasopharyngeal  Result Value Ref Range Status   MRSA by PCR NEGATIVE NEGATIVE Final    Comment:        The GeneXpert MRSA Assay (FDA approved for NASAL specimens only), is one component of a comprehensive MRSA colonization surveillance program. It is not intended to diagnose MRSA infection nor to guide or monitor treatment for MRSA infections. Performed at [facility], [address], [place], [place] [address]        Today   Subjective    Anthony Vaughn today has no new complaints -Eating and drinking well, ambulating without dizziness, shortness of breath or chest pains          Patient has been seen and examined prior to  discharge   Objective   Blood pressure (!) 150/113, pulse 84, temperature 98.5 F (36.9 C), temperature source Oral, resp. rate 16, height 6' (1.829 m), weight 71.2 kg, SpO2 100 %.   Intake/Output Summary (Last 24 hours) at 07/17/2019 1042 Last data filed at 07/16/2019 1700 Gross per 24 hour  Intake 480 ml  Output --  Net 480 ml    Exam Gen:- Awake Alert, no acute distress, thin appearing  HEENT:- [place].AT, No sclera icterus Neck-Supple Neck,No JVD,.  Lungs-  CTAB , good air movement bilaterally  CV- S1, S2 normal, regular Abd-  +ve B.Sounds, Abd Soft, hemostatic postop wounds, appropriate postop tenderness Extremity/Skin:- No  edema,   good pulses Psych-affect is appropriate, oriented x3 Neuro-no new focal deficits, no tremors    Data Review   CBC w Diff:  Lab Results  Component Value Date   WBC 4.6 07/17/2019   HGB 9.4 (L) 07/17/2019   HCT 29.1 (L) 07/17/2019   PLT 205 07/17/2019   LYMPHOPCT 34 07/17/2019   MONOPCT 11 07/17/2019   EOSPCT 2 07/17/2019   BASOPCT 0 07/17/2019    CMP:  Lab Results  Component Value Date   NA 138 07/16/2019   K 4.0 07/16/2019   CL 103 07/16/2019  CO2 30 07/16/2019   BUN 13 07/16/2019   CREATININE 0.96 07/16/2019   PROT 6.8 10/22/2018   ALBUMIN 3.5 10/22/2018   BILITOT 0.3 10/22/2018   ALKPHOS 55 10/22/2018   AST 15 10/22/2018   ALT 17 10/22/2018  . Total Discharge time is about 33 minutes  Shon Hale M.D on 07/17/2019 at 10:42 AM  Go to [email or web address] -  for contact info  [facility] - Office  [phone]

## 2019-07-22 ENCOUNTER — Telehealth: Payer: Self-pay | Admitting: General Surgery

## 2019-07-22 MED ORDER — OXYCODONE HCL 5 MG PO TABS: 5.0000 mg | ORAL_TABLET | ORAL | 0 refills | Status: DC | PRN

## 2019-07-22 NOTE — Telephone Encounter (Signed)
[facility]  Needing some more pain medication. Otherwise doing well and feeling ok, having BM.  Algis Greenhouse, MD [facility] [address] [place], [place] [address] [phone] (office)

## 2019-07-24 ENCOUNTER — Ambulatory Visit (INDEPENDENT_AMBULATORY_CARE_PROVIDER_SITE_OTHER): Payer: Self-pay | Admitting: General Surgery

## 2019-07-24 ENCOUNTER — Other Ambulatory Visit: Payer: Self-pay

## 2019-07-24 ENCOUNTER — Encounter: Payer: Self-pay | Admitting: General Surgery

## 2019-07-24 VITALS — BP 138/95 | HR 80 | Temp 98.3°F | Resp 16 | Ht 72.0 in | Wt 159.0 lb

## 2019-07-24 DIAGNOSIS — K5732 Diverticulitis of large intestine without perforation or abscess without bleeding: Secondary | ICD-10-CM

## 2019-07-24 DIAGNOSIS — K56699 Other intestinal obstruction unspecified as to partial versus complete obstruction: Secondary | ICD-10-CM

## 2019-07-24 NOTE — Progress Notes (Signed)
[facility] Note   HPI:  49 y.o. Male presents to clinic for post-op follow-up evaluation after a laparoscopic partial colectomy. Doing well overall. Patient reports having BMs and tolerating a diet and having only soreness.  Review of Systems:  No fevers or chills All other review of systems: otherwise negative   Vital Signs:  BP (!) 138/95    Pulse 80    Temp 98.3 F (36.8 C) (Oral)    Resp 16    Ht 6' (1.829 m)    Wt 159 lb (72.1 kg)    SpO2 99%    BMI 21.56 kg/m    Physical Exam:  Physical Exam Vitals reviewed.  Cardiovascular:     Rate and Rhythm: Normal rate.  Pulmonary:     Effort: Pulmonary effort is normal.  Abdominal:     General: There is no distension.     Palpations: Abdomen is soft.     Tenderness: There is no abdominal tenderness.     Comments: Incisions healing, induration at the midline extraction site, no erythema or drainage  Musculoskeletal:        General: Normal range of motion.  Skin:    General: Skin is warm and dry.  Neurological:     General: No focal deficit present.     Mental Status: He is alert and oriented to person, place, and time.  Psychiatric:        Mood and Affect: Mood normal.        Behavior: Behavior normal.        Thought Content: Thought content normal.        Judgment: Judgment normal.      Assessment:  49 y.o. yo Male s/p laparoscopic partial colectomy. Doing well overall.  Plan:  - No heavy lifting > 10 lbs, excessive bending, pushing, pulling, or squatting for 6-8 weeks after surgery.  - Continue diet as tolerated, keep Bms regular  - Will see in a few weeks  Future Appointments  Date Time Provider Department Center  08/06/2019  1:40 PM Branch, Dorothe Pea, MD [facility]  08/21/2019  1:00 PM Lucretia Roers, MD [facility] None  10/21/2019  9:30 AM Anice Paganini, NP [facility]      Algis Greenhouse, MD [facility] [address] [place] [place], [place]  [address] [phone] (office)

## 2019-07-24 NOTE — Patient Instructions (Signed)
No heavy lifting > 10 lbs, excessive bending, pushing, pulling, or squatting for 6-8 weeks after surgery.   

## 2019-08-04 ENCOUNTER — Telehealth: Payer: Self-pay | Admitting: Family Medicine

## 2019-08-04 NOTE — Telephone Encounter (Signed)
[facility]  Patient calling with nausea and increased zofran. Any other symptoms? Having BMs, fevers, chills, abdominal pain?  Is he able to keep food down?  May need to see this week if having issues.   Anthony Greenhouse, MD [facility] [address] [place], [place] [address] [phone] (office)

## 2019-08-04 NOTE — Telephone Encounter (Signed)
Pt has been unable to shake his nausea. He did call his PCP Friday and they increase his mg of Zofran to 8 mg but it has not helped at all. He is wondering what else he can do about his nausea?  If needed he will go back to his PCP he just was not sure which MD to call for advice.

## 2019-08-04 NOTE — Telephone Encounter (Signed)
Called pt on both numbers  - no answer and no vm on home # - LMTRC on cell #

## 2019-08-05 MED ORDER — METOCLOPRAMIDE HCL 5 MG PO TABS: 5.0000 mg | ORAL_TABLET | Freq: Three times a day (TID) | ORAL | 0 refills | Status: DC

## 2019-08-05 NOTE — Telephone Encounter (Signed)
Sent in a prescription for reglan 5 mg with meals and a night. This may help the nausea. Lets still try to see him this week if we are able to, but if he is not having other issues, he can see me next Tuesday too and see if the reglan helps.  Algis Greenhouse, MD [facility] [address] [place], [place] [address] [phone] (office)

## 2019-08-05 NOTE — Telephone Encounter (Signed)
Pt called back and states that he is not having any other issues he just can not shake the nausea. In conservation he states that he did see some blood in his stool last week and occasionally he has some lower abd pain. I did make him an appointment for Thursday unless you have other recommendations at this time.

## 2019-08-06 ENCOUNTER — Ambulatory Visit (INDEPENDENT_AMBULATORY_CARE_PROVIDER_SITE_OTHER): Payer: Medicaid Other | Admitting: Cardiology

## 2019-08-06 ENCOUNTER — Other Ambulatory Visit: Payer: Self-pay

## 2019-08-06 ENCOUNTER — Encounter: Payer: Self-pay | Admitting: *Deleted

## 2019-08-06 VITALS — BP 114/76 | HR 64 | Ht 72.0 in | Wt 155.0 lb

## 2019-08-06 DIAGNOSIS — I5022 Chronic systolic (congestive) heart failure: Secondary | ICD-10-CM

## 2019-08-06 DIAGNOSIS — R931 Abnormal findings on diagnostic imaging of heart and coronary circulation: Secondary | ICD-10-CM

## 2019-08-06 DIAGNOSIS — I10 Essential (primary) hypertension: Secondary | ICD-10-CM

## 2019-08-06 MED ORDER — CARVEDILOL 12.5 MG PO TABS
12.5000 mg | ORAL_TABLET | Freq: Two times a day (BID) | ORAL | 3 refills | Status: DC
Start: 2019-08-06 — End: 2019-08-27

## 2019-08-06 MED ORDER — LISINOPRIL 40 MG PO TABS
40.0000 mg | ORAL_TABLET | Freq: Every day | ORAL | 3 refills | Status: DC
Start: 2019-08-06 — End: 2020-08-18

## 2019-08-06 NOTE — Telephone Encounter (Signed)
Spoke to pt - aware of provider recommendations and medication. Pt states that he was having some bright red blood yesterday with BM and this morning it was dark red with BM. Suggested that he keep his apt for tomorrow and to get medication and start today. Pt verbalizes understanding.

## 2019-08-06 NOTE — Patient Instructions (Signed)
Medication Instructions:  Your physician has recommended you make the following change in your medication:  Decrease Coreg to 12.5 mg Two Times Daily  Tale Lisinopril 40 mg Daily   *If you need a refill on your cardiac medications before your next appointment, please call your pharmacy*   Lab Work: NONE   If you have labs (blood work) drawn today and your tests are completely normal, you will receive your results only by: Marland Kitchen MyChart Message (if you have MyChart) OR . A paper copy in the mail If you have any lab test that is abnormal or we need to change your treatment, we will call you to review the results.   Testing/Procedures: Your physician has requested that you have a lexiscan myoview. For further information please visit [email or web address] Please follow instruction sheet, as given.  Follow-Up: At [facility], you and your health needs are our priority.  As part of our continuing mission to provide you with exceptional heart care, we have created designated Provider Care Teams.  These Care Teams include your primary Cardiologist (physician) and Advanced Practice Providers (APPs -  Physician Assistants and Nurse Practitioners) who all work together to provide you with the care you need, when you need it.  We recommend signing up for the patient portal called "MyChart".  Sign up information is provided on this After Visit Summary.  MyChart is used to connect with patients for Virtual Visits (Telemedicine).  Patients are able to view lab/test results, encounter notes, upcoming appointments, etc.  Non-urgent messages can be sent to your provider as well.   To learn more about what you can do with MyChart, go to [email or web address].    Your next appointment:   1 month(s)  The format for your next appointment:   In Person  Provider:   Dina Rich, MD   Other Instructions Thank you for choosing [facility]!

## 2019-08-06 NOTE — Progress Notes (Signed)
Clinical Summary Anthony Vaughn is a 49 y.o.male seen today as a new patient for the following medical problems.   1. HTN - admitted 07/2019 with severe HTN, started on cardene drip - compliant with meds since discharge - home SBPs 140s.    2. Chronic systolic HF - echo during admission LVEF 40-45%, inferior wall akinetic, basal ingferolateral and apical inferior are hypokinetic.   - reports on coreg some fatigue in the past.Recurrent symptoms on higher dose.  - no recent chest pains - no exertional symptoms   3. History of diverticulitis - recent hemicolectomy 07/2019    Past Medical History:  Diagnosis Date  . Arthritis   . Diverticulitis   . GERD (gastroesophageal reflux disease)   . HTN (hypertension)      Allergies  Allergen Reactions  . Other     States he's allergic to a blood pressure pill that causes abdominal pain. He's not sure name of medication     Current Outpatient Medications  Medication Sig Dispense Refill  . amLODipine (NORVASC) 10 MG tablet Take 1 tablet (10 mg total) by mouth daily. For Blood Pressure 30 tablet 5  . carvedilol (COREG) 25 MG tablet Take 1 tablet (25 mg total) by mouth 2 (two) times daily with a meal. 60 tablet 5  . gabapentin (NEURONTIN) 300 MG capsule Take 1 capsule (300 mg total) by mouth 2 (two) times daily. 60 capsule 4  . lisinopril (ZESTRIL) 20 MG tablet Take 1 tablet (20 mg total) by mouth daily. 30 tablet 5  . ondansetron (ZOFRAN) 4 MG tablet Take 1 tablet (4 mg total) by mouth every 6 (six) hours as needed for nausea or vomiting. 20 tablet 0   No current facility-administered medications for this visit.     Past Surgical History:  Procedure Laterality Date  . BIOPSY  03/04/2019   Procedure: BIOPSY;  Surgeon: Corbin Ade, MD;  [facility];  Service: Endoscopy;;  gastric  . COLONOSCOPY    . COLONOSCOPY WITH PROPOFOL N/A 03/04/2019   Procedure: COLONOSCOPY WITH PROPOFOL;  Surgeon: Corbin Ade, MD;   Location: [facility];  Service: Endoscopy;  Laterality: N/A;  7:30am  . ESOPHAGOGASTRODUODENOSCOPY (EGD) WITH PROPOFOL N/A 03/04/2019   Procedure: ESOPHAGOGASTRODUODENOSCOPY (EGD) WITH PROPOFOL;  Surgeon: Corbin Ade, MD;  Location[facility];  Service: Endoscopy;  Laterality: N/A;  . LAPAROSCOPIC PARTIAL COLECTOMY N/A 07/14/2019   Procedure: LAPAROSCOPIC  LEFT HEMICOLECTOMY;  Surgeon: Lucretia Roers, MD;  Location: [facility];  Service: General;  Laterality: N/A;  . NO PAST SURGERIES       Allergies  Allergen Reactions  . Other     States he's allergic to a blood pressure pill that causes abdominal pain. He's not sure name of medication      Family History  Problem Relation Age of Onset  . Lung cancer Father   . Colon cancer Neg Hx   . Colon polyps Neg Hx      Social History Mr. Cichy reports that he has been smoking cigarettes. He has a 12.50 pack-year smoking history. He has never used smokeless tobacco. Mr. Ke reports no history of alcohol use.   Review of Systems CONSTITUTIONAL: No weight loss, fever, chills, weakness or fatigue.  HEENT: Eyes: No visual loss, blurred vision, double vision or yellow sclerae.No hearing loss, sneezing, congestion, runny nose or sore throat.  SKIN: No rash or itching.  CARDIOVASCULAR: per hpi RESPIRATORY: No shortness of breath, cough  or sputum.  GASTROINTESTINAL: No anorexia, nausea, vomiting or diarrhea. No abdominal pain or blood.  GENITOURINARY: No burning on urination, no polyuria NEUROLOGICAL: No headache, dizziness, syncope, paralysis, ataxia, numbness or tingling in the extremities. No change in bowel or bladder control.  MUSCULOSKELETAL: No muscle, back pain, joint pain or stiffness.  LYMPHATICS: No enlarged nodes. No history of splenectomy.  PSYCHIATRIC: No history of depression or anxiety.  ENDOCRINOLOGIC: No reports of sweating, cold or heat intolerance. No polyuria or polydipsia.  Marland Kitchen   Physical  Examination Vitals:   08/06/19 1335  BP: 114/76  Pulse: 64  SpO2: 99%   Filed Weights   08/06/19 1335  Weight: 155 lb (70.3 kg)    Gen: resting comfortably, no acute distress HEENT: no scleral icterus, pupils equal round and reactive, no palptable cervical adenopathy,  CV: RRR, no m/r/g, no jvd Resp: Clear to auscultation bilaterally GI: abdomen is soft, non-tender, non-distended, normal bowel sounds, no hepatosplenomegaly MSK: extremities are warm, no edema.  Skin: warm, no rash Neuro:  no focal deficits Psych: appropriate affect     Assessment and Plan  1. HTN - at goal today - some reported side effects on higher coreg dosing, will try 12.5mg  bid. If ongoing could try changing to toprol xl  2. Chronic systolic HF - mild LV systolic dysfunction by echo - EKG and echo would suggest potential prior inferior infarct - he has no current chest pains or SOB. Absence of symptoms and just mild systolic dysfunction would plan for stress testing as opposed to cath, obtain lexiscan. If CAD further suggested by nuclear would need to consider ASA and statin.  - lower coreg due to side effects, incrase lisinopril to 40mg  daily.        , M.D.

## 2019-08-07 ENCOUNTER — Ambulatory Visit (INDEPENDENT_AMBULATORY_CARE_PROVIDER_SITE_OTHER): Payer: Medicaid Other | Admitting: General Surgery

## 2019-08-07 ENCOUNTER — Encounter: Payer: Self-pay | Admitting: General Surgery

## 2019-08-07 ENCOUNTER — Other Ambulatory Visit: Payer: Self-pay

## 2019-08-07 VITALS — BP 152/100 | HR 72 | Temp 96.4°F | Resp 14 | Ht 72.0 in | Wt 155.0 lb

## 2019-08-07 DIAGNOSIS — K56699 Other intestinal obstruction unspecified as to partial versus complete obstruction: Secondary | ICD-10-CM

## 2019-08-07 DIAGNOSIS — I10 Essential (primary) hypertension: Secondary | ICD-10-CM

## 2019-08-07 DIAGNOSIS — K2900 Acute gastritis without bleeding: Secondary | ICD-10-CM

## 2019-08-07 MED ORDER — SUCRALFATE 1 G PO TABS
1.0000 g | ORAL_TABLET | Freq: Three times a day (TID) | ORAL | 0 refills | Status: DC
Start: 2019-08-07 — End: 2019-12-31

## 2019-08-07 MED ORDER — PANTOPRAZOLE SODIUM 40 MG PO TBEC
40.0000 mg | DELAYED_RELEASE_TABLET | Freq: Two times a day (BID) | ORAL | 0 refills | Status: DC
Start: 2019-08-07 — End: 2020-09-16

## 2019-08-07 NOTE — Progress Notes (Signed)
[facility] Note   HPI:  49 y.o. Male presents to clinic for follow-up evaluation after his laparoscopic sigmoid colectomy. He has been doing ok but has been nauseated and says he had some dark stools/ bloody stools. He is otherwise eating and having Bm and no fevers. He has some epigastric pain. Was taking a lot of ibuprofen and has stopped. He had called about nausea and I sent in some reglan but asked him to see me in person to evaluate further.  Review of Systems:  Epigastric pain Nausea Dark stools  All other review of systems: otherwise negative   Vital Signs:  BP (!) 152/100   Pulse 72   Temp (!) 96.4 F (35.8 C) (Temporal)   Resp 14   Ht 6' (1.829 m)   Wt 155 lb (70.3 kg)   SpO2 98%   BMI 21.02 kg/m    Physical Exam:  Physical Exam Vitals reviewed.  HENT:     Head: Normocephalic.  Cardiovascular:     Rate and Rhythm: Normal rate.  Pulmonary:     Effort: Pulmonary effort is normal.  Abdominal:     General: There is no distension.     Palpations: Abdomen is soft.     Tenderness: There is no abdominal tenderness.     Comments: Healing incisions  Neurological:     Mental Status: He is alert.    Assessment:  48 y.o. yo Male with likely some degree of gastritis from ibuprofen. Discussed cessation of this and starting a PPI and carafate.  Plan:  Stay off the ibuprofen. Take protonix twice daily and carafate (or maalox).   Keep stools regular and soft.  Call with issues.  Will see back in a few weeks Take reglan if needed for nausea.   Future Appointments  Date Time Provider Department Center  08/14/2019  8:45 AM [facility] 2 [facility] [place] [facility]  08/14/2019 10:15 AM [facility] [facility]  08/21/2019  1:00 PM Lucretia Roers, MD RS-RS None  09/02/2019  1:00 PM Iran Ouch, Lennart Pall, PA-C [facility] [place] [facility]  10/21/2019  9:30 AM Anice Paganini, NP [facility]   Algis Greenhouse, MD [facility] [address] [place], [place] [address] [phone] (office)

## 2019-08-07 NOTE — Patient Instructions (Signed)
Stay off the ibuprofen. Take protonix twice daily and carafate (or maalox).   Keep stools regular and soft.  Call with issues.   Gastritis, Adult Gastritis is inflammation of the stomach. There are two kinds of gastritis:  Acute gastritis. This kind develops suddenly.  Chronic gastritis. This kind is much more common and lasts for a long time. Gastritis happens when the lining of the stomach becomes weak or gets damaged. Without treatment, gastritis can lead to stomach bleeding and ulcers. What are the causes? This condition may be caused by:  An infection.  Drinking too much alcohol.  Certain medicines. These include steroids, antibiotics, and some over-the-counter medicines, such as aspirin or ibuprofen.  Having too much acid in the stomach.  A disease of the intestines or stomach.  Stress.  An allergic reaction.  Crohn's disease.  Some cancer treatments (radiation). Sometimes the cause of this condition is not known. What are the signs or symptoms? Symptoms of this condition include:  Pain or a burning sensation in the upper abdomen.  Nausea.  Vomiting.  An uncomfortable feeling of fullness after eating.  Weight loss.  Bad breath.  Blood in your vomit or stools. In some cases, there are no symptoms. How is this diagnosed? This condition may be diagnosed with:  Your medical history and a description of your symptoms.  A physical exam.  Tests. These can include: ? Blood tests. ? Stool tests. ? A test in which a thin, flexible instrument with a light and a camera is passed down the esophagus and into the stomach (upper endoscopy). ? A test in which a sample of tissue is taken for testing (biopsy). How is this treated? This condition may be treated with medicines. The medicines that are used vary depending on the cause of the gastritis:  If the condition is caused by a bacterial infection, you may be given antibiotic medicines.  If the condition is  caused by too much acid in the stomach, you may be given medicines called H2 blockers, proton pump inhibitors, or antacids. Treatment may also involve stopping the use of certain medicines, such as aspirin, ibuprofen, or other NSAIDs. Follow these instructions at home: Medicines  Take over-the-counter and prescription medicines only as told by your health care provider.  If you were prescribed an antibiotic medicine, take it as told by your health care provider. Do not stop taking the antibiotic even if you start to feel better. Eating and drinking   Eat small, frequent meals instead of large meals.  Avoid foods and drinks that make your symptoms worse.  Drink enough fluid to keep your urine pale yellow. Alcohol use  Do not drink alcohol if: ? Your health care provider tells you not to drink. ? You are pregnant, may be pregnant, or are planning to become pregnant.  If you drink alcohol: ? Limit your use to:  0-1 drink a day for women.  0-2 drinks a day for men. ? Be aware of how much alcohol is in your drink. In the [place] one drink equals one 12 oz bottle of beer (355 mL), one 5 oz glass of wine (148 mL), or one 1 oz glass of hard liquor (44 mL). General instructions  Talk with your health care provider about ways to manage stress, such as getting regular exercise or practicing deep breathing, meditation, or yoga.  Do not use any products that contain nicotine or tobacco, such as cigarettes and e-cigarettes. If you need help quitting, ask your health  care provider.  Keep all follow-up visits as told by your health care provider. This is important. Contact a health care provider if:  Your symptoms get worse.  Your symptoms return after treatment. Get help right away if:  You vomit blood or material that looks like coffee grounds.  You have black or dark red stools.  You are unable to keep fluids down.  Your abdominal pain gets worse.  You have a fever.  You do not  feel better after one week. Summary  Gastritis is inflammation of the lining of the stomach that can occur suddenly (acute) or develop slowly over time (chronic).  This condition is diagnosed with a medical history, a physical exam, or tests.  This condition may be treated with medicines to treat infection or medicines to reduce the amount of acid in your stomach.  Follow your health care provider's instructions about taking medicines, making changes to your diet, and knowing when to call for help. This information is not intended to replace advice given to you by your health care provider. Make sure you discuss any questions you have with your health care provider. Document Revised: 06/12/2017 Document Reviewed: 06/12/2017 Elsevier Patient Education  2020 [employer].

## 2019-08-14 ENCOUNTER — Ambulatory Visit (HOSPITAL_COMMUNITY)
Admission: RE | Admit: 2019-08-14 | Discharge: 2019-08-14 | Disposition: A | Discharge status: Home / Self Care | Payer: Medicaid Other | Source: Ambulatory Visit | Attending: Cardiology | Admitting: Cardiology

## 2019-08-14 ENCOUNTER — Other Ambulatory Visit: Payer: Self-pay

## 2019-08-14 ENCOUNTER — Encounter (HOSPITAL_COMMUNITY)
Admission: RE | Admit: 2019-08-14 | Discharge: 2019-08-14 | Disposition: A | Discharge status: Home / Self Care | Payer: Medicaid Other | Source: Ambulatory Visit | Attending: Cardiology | Admitting: Cardiology

## 2019-08-14 DIAGNOSIS — R931 Abnormal findings on diagnostic imaging of heart and coronary circulation: Secondary | ICD-10-CM | POA: Diagnosis present

## 2019-08-14 DIAGNOSIS — R9430 Abnormal result of cardiovascular function study, unspecified: Secondary | ICD-10-CM

## 2019-08-14 LAB — NM MYOCAR MULTI W/SPECT W/WALL MOTION / EF
LV dias vol: 206 mL (ref 62–150)
LV sys vol: 144 mL
Peak HR: 101 {beats}/min
RATE: 0.28
Rest HR: 60 {beats}/min
SDS: 2
SRS: 17
SSS: 19
TID: 1.07

## 2019-08-14 MED ORDER — SODIUM CHLORIDE FLUSH 0.9 % IV SOLN
INTRAVENOUS | Status: AC
  Administered 2019-08-14: 10 mL via INTRAVENOUS
  Filled 2019-08-14: qty 10

## 2019-08-14 MED ORDER — REGADENOSON 0.4 MG/5ML IV SOLN
INTRAVENOUS | Status: AC
  Administered 2019-08-14: 0.4 mg via INTRAVENOUS
  Filled 2019-08-14: qty 5

## 2019-08-14 MED ORDER — TECHNETIUM TC 99M TETROFOSMIN IV KIT
10.0000 | PACK | Freq: Once | INTRAVENOUS | Status: AC | PRN
  Administered 2019-08-14: 10.2 via INTRAVENOUS

## 2019-08-14 MED ORDER — TECHNETIUM TC 99M TETROFOSMIN IV KIT
30.0000 | PACK | Freq: Once | INTRAVENOUS | Status: AC | PRN
  Administered 2019-08-14: 29.4 via INTRAVENOUS

## 2019-08-21 ENCOUNTER — Ambulatory Visit: Payer: Self-pay | Admitting: General Surgery

## 2019-08-25 ENCOUNTER — Telehealth: Payer: Self-pay | Admitting: *Deleted

## 2019-08-25 NOTE — Telephone Encounter (Signed)
-----   Message from Antoine Poche, MD sent at 08/22/2019  2:29 PM EDT ----- Stress test does no current blockages. 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 Continue current medications to help strengthen the heart  Dominga Ferry MD

## 2019-08-25 NOTE — Telephone Encounter (Signed)
Lesle Chris, LPN  6/81/2751 7:00 PM EDT Back to Top    Notified, copy to pcp.

## 2019-08-27 ENCOUNTER — Encounter: Payer: Self-pay | Admitting: General Surgery

## 2019-08-27 ENCOUNTER — Telehealth: Payer: Self-pay | Admitting: Student

## 2019-08-27 ENCOUNTER — Other Ambulatory Visit: Payer: Self-pay

## 2019-08-27 ENCOUNTER — Ambulatory Visit (INDEPENDENT_AMBULATORY_CARE_PROVIDER_SITE_OTHER): Payer: Self-pay | Admitting: General Surgery

## 2019-08-27 VITALS — BP 179/123 | HR 79 | Temp 98.4°F | Resp 14 | Ht 72.0 in | Wt 156.0 lb

## 2019-08-27 DIAGNOSIS — I10 Essential (primary) hypertension: Secondary | ICD-10-CM

## 2019-08-27 DIAGNOSIS — K5732 Diverticulitis of large intestine without perforation or abscess without bleeding: Secondary | ICD-10-CM

## 2019-08-27 MED ORDER — CARVEDILOL 25 MG PO TABS: 25.0000 mg | ORAL_TABLET | Freq: Two times a day (BID) | ORAL | 3 refills | Status: DC

## 2019-08-27 NOTE — Telephone Encounter (Signed)
I spoke with patient. He states his fatigue is no better after reducing Coreg dose to 12.5 mg bid. He says he gets fatigued easily. He will go back to Coreg 25 mg bid and keep diary of blood pressures.

## 2019-08-27 NOTE — Progress Notes (Signed)
[facility] Note   HPI:  49 y.o. Male presents to clinic for post-op follow-up evaluation after a laparoscopic sigmoid colectomy. He had a complicated post op course due to hypertension and has been being seen by cardiology due to some underlying heart failure. Patient reports his BP at home is usually 180 range.  He has been taking his meds and says he took them at 6Am. He takes his evening Coreg at 6pm.   Review of Systems:  No chest pain No SOB No dizziness No headache  Brown stools Improving nausea All other review of systems: otherwise negative   Vital Signs:  BP (!) 179/123   Pulse 79   Temp 98.4 F (36.9 C) (Oral)   Resp 14   Ht 6' (1.829 m)   Wt 156 lb (70.8 kg)   SpO2 98%   BMI 21.16 kg/m    Repeat 199/143  Manual 180/130   Physical Exam:  Physical Exam Vitals reviewed.  HENT:     Head: Normocephalic.  Cardiovascular:     Rate and Rhythm: Normal rate.  Pulmonary:     Effort: Pulmonary effort is normal.  Abdominal:     General: There is no distension.     Palpations: Abdomen is soft.     Tenderness: There is no abdominal tenderness.     Hernia: No hernia is present.     Comments: Healing midline incision and port sites, no erythema or drainage  Neurological:     Mental Status: He is alert.     Assessment:  49 y.o. yo Male s/p laparoscopic sigmoid colectomy for diverticulitis. He has improving nausea and resolved dark stools after we treated for potential gastritis/ ulcer after using ibuprofen. He unfortunately has very elevated BP today. He had a normal BP with Dr. Wyline Mood in late June in the afternoon appt. He says he was stressed today due to issues with his teenagers. He understands the seriousness of the BP management.   Plan:  Take your Coreg when you get home. Check your BP> if it is staying > 180 upper and > 100 lower then you need to call and see the Cardiologist versus PCP as soon as possible to get them to adjust your  medications. If you have any headaches, dizziness or signs of stroke (weakness, confusion, slurred speech) go to the ED. Take your BP meds as prescribed. Information on Hypertensive Urgency/ Emergency printed out.  I plan to get in touch with cardiology so they can see if they want to see him earlier.   Can start adding back to your diet. Start to slowly do more activity and lift once you are 6-8 weeks post op.  Call with worsening pain or dark BMs.   PRN follow up with me.   Future Appointments  Date Time Provider Department Center  09/02/2019  1:00 PM Ellsworth Lennox, [place] [facility] [place] [facility]  10/21/2019  9:30 AM Anice Paganini, NP [facility]    All of the above recommendations were discussed with the patient, and all of patient's questions were answered to his expressed satisfaction.  Algis Greenhouse, MD [facility] [address] [place], [place] [address] [phone] (office)

## 2019-08-27 NOTE — Telephone Encounter (Signed)
    I received a call from Dr. Henreitta Leber about this mutual patient as his BP was significantly elevated at the time of his office visit today with readings ranging from 199/143 to 179/123. Asymptomatic at that time. BP was well controlled at the time of his office visit with Dr. Wyline Mood last month but it appears Coreg was reduced to 12.5 mg twice daily due to fatigue and Lisinopril was titrated to 40 mg daily.  Please reach out to the patient in regards to this. If his fatigue improved with Coreg, would continue at current dosing and start Hydralazine 25mg  BID (already on max dose of Amlodipine and Lisinopril).  If fatigue did not improve, would recommend titrating Coreg to his prior dose of 25mg  BID.   Please encourage him to keep a BP log and bring this to his visit next week.   Signed, , PA-C 08/27/2019, 3:11 PM Pager: [phone]

## 2019-08-27 NOTE — Patient Instructions (Addendum)
Take your Coreg when you get home. Check your BP> if it is staying > 180 upper and > 100 lower then you need to call and see the Cardiologist versus PCP as soon as possible to get them to adjust your medications. If you have any headaches, dizziness or signs of stroke (weakness, confusion, slurred speech) go to the ED. Take your BP meds as prescribed. Information on Hypertensive Urgency/ Emergency printed out.   Can start adding back to your diet. Start to slowly do more activity and lift once you are 6-8 weeks post op.  Call with worsening pain or dark BMs.

## 2019-09-02 ENCOUNTER — Ambulatory Visit: Payer: Medicaid Other | Admitting: Student

## 2019-10-21 ENCOUNTER — Encounter: Payer: Self-pay | Admitting: Internal Medicine

## 2019-10-21 ENCOUNTER — Telehealth: Payer: Self-pay | Admitting: *Deleted

## 2019-10-21 ENCOUNTER — Other Ambulatory Visit: Payer: Self-pay

## 2019-10-21 ENCOUNTER — Telehealth (INDEPENDENT_AMBULATORY_CARE_PROVIDER_SITE_OTHER): Payer: Medicaid Other | Admitting: Nurse Practitioner

## 2019-10-21 ENCOUNTER — Encounter: Payer: Self-pay | Admitting: Nurse Practitioner

## 2019-10-21 DIAGNOSIS — Z720 Tobacco use: Secondary | ICD-10-CM | POA: Diagnosis not present

## 2019-10-21 DIAGNOSIS — K219 Gastro-esophageal reflux disease without esophagitis: Secondary | ICD-10-CM

## 2019-10-21 DIAGNOSIS — R1030 Lower abdominal pain, unspecified: Secondary | ICD-10-CM | POA: Diagnosis not present

## 2019-10-21 DIAGNOSIS — K5732 Diverticulitis of large intestine without perforation or abscess without bleeding: Secondary | ICD-10-CM

## 2019-10-21 DIAGNOSIS — K56699 Other intestinal obstruction unspecified as to partial versus complete obstruction: Secondary | ICD-10-CM

## 2019-10-21 NOTE — Patient Instructions (Addendum)
Your health issues we discussed today were:   Abdominal pain from recurrent diverticulitis: 1. I am glad you are doing better after surgery! 2. Hopefully continue to do well 3. We cannot guarantee that you will not have diverticulitis in the future, but the major problem area has been removed 4. Call us if you have any worsening or recurrent symptoms  GERD (reflux/heartburn): 1. I am glad you are doing well on your acid blocker 2. Continue taking your acid blocker and call us if you have any worsening symptoms  Overall I recommend:  1. Continue your other current medications 2. Return for follow-up in 6 months 3. Call us if you have any questions or concerns   At [facility] we value your feedback. You may receive a survey about your visit today. Please share your experience as we strive to create trusting relationships with our patients to provide genuine, compassionate, quality care.  We appreciate your understanding and patience as we review any laboratory studies, imaging, and other diagnostic tests that are ordered as we care for you. Our office policy is 5 business days for review of these results, and any emergent or urgent results are addressed in a timely manner for your best interest. If you do not hear from our office in 1 week, please contact us.   We also encourage the use of MyChart, which contains your medical information for your review as well. If you are not enrolled in this feature, an access code is on this after visit summary for your convenience. Thank you for allowing [place] to be involved in your care.  It was great to see you today!  I hope you have a great rest of your summer!!!

## 2019-10-21 NOTE — Progress Notes (Signed)
Referring Provider: Waldon Reining, MD Primary Care Physician:  Waldon Reining, MD Primary GI:  Dr. Jena Gauss  NOTE: Service was provided via telemedicine and was requested by the patient due to COVID-19 pandemic.  Patient Location: Home  Provider Location: [facility] office  Reason for Phone Visit: Follow-up  Persons present on the phone encounter, with roles: Patient, myself (provider),Mindy Estudillo (updated meds and allergies)  Total time (minutes) spent on medical discussion: 25 minutes  Due to COVID-19, visit was conducted using the virtual method noted. Visit was requested by patient.  I connected with  Linward Headland on 10/21/19 at  9:30 AM EDT by video call and verified that I am speaking with the correct person using two identifiers.   I discussed the limitations, risks, security and privacy concerns of performing an evaluation and management service by telephone and the availability of in person appointments. I also discussed with the patient that there may be a patient responsible charge related to this service. The patient expressed understanding and agreed to proceed.  Chief Complaint  Patient presents with  . Abdominal Pain    rarely having any pain  . Diarrhea    few weeks ago but not now    HPI:   Anthony Vaughn is a 49 y.o. male who presents for virtual visit regarding: Follow-up.  The patient was last seen in our office 04/15/2019 for lower abdominal pain, history of diverticulosis/diverticulitis, epigastric pain.  Noted history of diverticulosis/diverticulitis with epigastric pain in the setting of NSAIDs and improved after PPI.  Previously canceled/no-show to Covid testing for EGD and colonoscopy.  2 recent visits to [facility] with CT showing diverticulitis but "may be something else as well" and discharged on antibiotics due to high census.  Initial treatment with Cipro and then returned treatment with Cipro and Flagyl.  Persistent pain even after  completing antibiotics.  Review of CT findings include diverticulitis x2 episodes with persistent symptoms so Augmentin 875 3 times daily for 10 days was prescribed.  Had some improvement.  He was endoscopically evaluated with EGD on 03/04/2019 with gastric mucosal changes found to be chronic gastritis and focal intestinal metaplasia negative for H. pylori and otherwise normal.  Recommended continue PPI and avoid all NSAIDs.  Colonoscopy the same day with diverticulosis in the entire colon with a stenotic, stiff, chronically inflamed sigmoid segment.  Recommended Benefiber and low threshold for surgical consultation and repeat colonoscopy in 10 years.  At his last visit significantly improved since colonoscopy, especially with dietary changes.  Some mild hematochezia x1 episode after his procedure but none since.  No other overt GI complaints.  Recommended referral to surgery to consider hemicolectomy, continue Protonix, follow-up in 6 months.  It appears he saw a surgeon Dr. Algis Greenhouse on 05/01/2019.  Follow-up visit 06/10/2019 and recommended partial colectomy.  Surgery performed 07/14/2019 finding a hardened area of sigmoid colon diverticula in the descending colon.  Noted relatively uncomplicated postoperative course.  Today he states he is doing okay overall. States he feels great now. Rare/occasional nausea or loose stools. Abdominal pain has now resolved. Is healing well. Rare hematochezia, no melena. Denies vomiting, chills, unintentional weight loss. Denies URI or flu-like symptoms. Denies loss of sense of taste or smell. He did have a low grade fever and kids had COVID-19, has since resolved. He cancelled his visit due to this. He is quarantining him at home, was tested (negative) but undertook a quarantine to be safe. The patient has not received COVID-19 vaccination(s).  They are not interested in vaccine scheduling information. Denies chest pain, dyspnea, dizziness, lightheadedness, syncope,  near syncope. Denies any other upper or lower GI symptoms.  GERD doing well overall on PPI, rare breakthrough. He still smokes half pack a day. He was given patches to help quit, but is holding off for now due to a lot going on. Discussed quitting smoking and health benefits. Offered resources in the future if need.  Past Medical History:  Diagnosis Date  . Arthritis   . Diverticulitis   . GERD (gastroesophageal reflux disease)   . HTN (hypertension)     Past Surgical History:  Procedure Laterality Date  . BIOPSY  03/04/2019   Procedure: BIOPSY;  Surgeon: Corbin Ade, MD;  [facility];  Service: Endoscopy;;  gastric  . COLONOSCOPY    . COLONOSCOPY WITH PROPOFOL N/A 03/04/2019   Procedure: COLONOSCOPY WITH PROPOFOL;  Surgeon: Corbin Ade, MD;  [facility];  Service: Endoscopy;  Laterality: N/A;  7:30am  . ESOPHAGOGASTRODUODENOSCOPY (EGD) WITH PROPOFOL N/A 03/04/2019   Procedure: ESOPHAGOGASTRODUODENOSCOPY (EGD) WITH PROPOFOL;  Surgeon: Corbin Ade, MD;  [facility];  Service: Endoscopy;  Laterality: N/A;  . LAPAROSCOPIC PARTIAL COLECTOMY N/A 07/14/2019   Procedure: LAPAROSCOPIC  LEFT HEMICOLECTOMY;  Surgeon: Lucretia Roers, MD;  Location: [facility];  Service: General;  Laterality: N/A;    Current Outpatient Medications  Medication Sig Dispense Refill  . amLODipine (NORVASC) 10 MG tablet Take 1 tablet (10 mg total) by mouth daily. For Blood Pressure 30 tablet 5  . carvedilol (COREG) 25 MG tablet Take 1 tablet (25 mg total) by mouth 2 (two) times daily. 180 tablet 3  . lisinopril (ZESTRIL) 40 MG tablet Take 1 tablet (40 mg total) by mouth daily. 90 tablet 3  . pantoprazole (PROTONIX) 40 MG tablet Take 1 tablet (40 mg total) by mouth 2 (two) times daily. (Patient taking differently: Take 40 mg by mouth as needed. ) 60 tablet 0  . sucralfate (CARAFATE) 1 g tablet Take 1 tablet (1 g total) by mouth 4 (four) times daily -  with meals and at bedtime.  (Patient taking differently: Take 1 g by mouth 4 (four) times daily -  with meals and at bedtime. As needed) 120 tablet 0   No current facility-administered medications for this visit.    Allergies as of 10/21/2019 - Review Complete 10/21/2019  Allergen Reaction Noted  . Other  02/04/2019    Family History  Problem Relation Age of Onset  . Lung cancer Father   . Colon cancer Neg Hx   . Colon polyps Neg Hx     Social History   Socioeconomic History  . Marital status: Single    Spouse name: Not on file  . Number of children: Not on file  . Years of education: Not on file  . Highest education level: Not on file  Occupational History  . Not on file  Tobacco Use  . Smoking status: Current Every Day Smoker    Packs/day: 0.50    Years: 25.00    Pack years: 12.50    Types: Cigarettes  . Smokeless tobacco: Never Used  Vaping Use  . Vaping Use: Never used  Substance and Sexual Activity  . Alcohol use: No  . Drug use: No  . Sexual activity: Yes  Other Topics Concern  . Not on file  Social History Narrative  . Not on file   Social Determinants of Health  Financial Resource Strain:   . Difficulty of Paying Living Expenses: Not on file  Food Insecurity:   . Worried About Programme researcher, broadcasting/film/video in the Last Year: Not on file  . Ran Out of Food in the Last Year: Not on file  Transportation Needs:   . Lack of Transportation (Medical): Not on file  . Lack of Transportation (Non-Medical): Not on file  Physical Activity:   . Days of Exercise per Week: Not on file  . Minutes of Exercise per Session: Not on file  Stress:   . Feeling of Stress : Not on file  Social Connections:   . Frequency of Communication with Friends and Family: Not on file  . Frequency of Social Gatherings with Friends and Family: Not on file  . Attends Religious Services: Not on file  . Active Member of Clubs or Organizations: Not on file  . Attends Banker Meetings: Not on file  . Marital  Status: Not on file    Review of Systems: Review of Systems  Constitutional: Negative for chills, fever, malaise/fatigue and weight loss.  HENT: Negative for congestion and sore throat.   Respiratory: Negative for cough and shortness of breath.   Cardiovascular: Negative for chest pain and palpitations.  Gastrointestinal: Positive for heartburn (Rare). Negative for abdominal pain, blood in stool, diarrhea, melena, nausea and vomiting.  Musculoskeletal: Negative for joint pain and myalgias.  Skin: Negative for rash.  Neurological: Negative for dizziness and weakness.  Endo/Heme/Allergies: Does not bruise/bleed easily.  Psychiatric/Behavioral: Negative for depression. The patient is not nervous/anxious.   All other systems reviewed and are negative.   Physical Exam: Note: limited exam due to virtual visit There were no vitals taken for this visit. Physical Exam Nursing note reviewed.  Constitutional:      General: He is not in acute distress.    Appearance: Normal appearance. He is not ill-appearing, toxic-appearing or diaphoretic.  HENT:     Head: Normocephalic and atraumatic.     Nose: No congestion or rhinorrhea.  Eyes:     General: No scleral icterus. Pulmonary:     Effort: Pulmonary effort is normal.  Abdominal:     General: There is no distension.  Skin:    Coloration: Skin is not jaundiced.     Findings: No bruising.  Neurological:     General: No focal deficit present.     Mental Status: He is alert and oriented to person, place, and time. Mental status is at baseline.  Psychiatric:        Mood and Affect: Mood normal.        Behavior: Behavior normal.        Thought Content: Thought content normal.       Assessment: Very pleasant 49 year old male seen today on video visit.  History of recurrent diverticulitis and multiple admissions, courses of antibiotics.  Persistent ongoing abdominal pain.  Colonoscopy with stenotic sigmoid colon likely due to repeated  diverticulitis.  He was evaluated by surgery and eventually underwent a hemicolectomy as outlined above.  Since then his abdominal pain is essentially resolved.  No further symptoms in general, feels he is doing well.  GERD symptoms continue to do well on PPI.  I recommended he continue these  Recently with likely COVID-19, although he tested negative he was symptomatic and his children both tested positive.  He is imposed his own quarantine for safety sake.  I have advised him to seek medical care for any worsening symptoms  Plan:  1. Continue current medications 2. Return for follow-up in 6 months 3. Call us for any worsening symptoms     Thank you for allowing [place] to participate in the care of Melissa K Kathleen Lime, DNP, AGNP-C Adult & Gerontological Nurse Practitioner [facility]

## 2019-10-21 NOTE — Telephone Encounter (Signed)
Anthony Vaughn, you are scheduled for a virtual visit with your provider today.  Just as we do with appointments in the office, we must obtain your consent to participate.  Your consent will be active for this visit and any virtual visit you may have with one of our providers in the next 365 days.  If you have a MyChart account, I can also send a copy of this consent to you electronically.  All virtual visits are billed to your insurance company just like a traditional visit in the office.  As this is a virtual visit, video technology does not allow for your provider to perform a traditional examination.  This may limit your provider's ability to fully assess your condition.  If your provider identifies any concerns that need to be evaluated in person or the need to arrange testing such as labs, EKG, etc, we will make arrangements to do so.  Although advances in technology are sophisticated, we cannot ensure that it will always work on either your end or our end.  If the connection with a video visit is poor, we may have to switch to a telephone visit.  With either a video or telephone visit, we are not always able to ensure that we have a secure connection.   I need to obtain your verbal consent now.   Are you willing to proceed with your visit today?

## 2019-10-21 NOTE — Telephone Encounter (Signed)
Pt consented to a virtual visit on 10/21/2019. 

## 2019-10-22 NOTE — Progress Notes (Signed)
Cc'ed to pcp °

## 2019-10-30 ENCOUNTER — Ambulatory Visit: Payer: Medicaid Other | Admitting: Student

## 2019-10-30 NOTE — Progress Notes (Deleted)
Cardiology Office Note    Date:  10/30/2019   ID:  Dao Memmott, DOB 1970/03/19, MRN [id number]  PCP:  Waldon Reining, MD  Cardiologist: Dina Rich, MD    No chief complaint on file.   History of Present Illness:    Anthony Vaughn is a 49 y.o. male with past medical history of chronic systolic CHF (Echo in 07/2019 showing EF at 40-45%), HTN and history of diverticulitis (s/p hemicolectomy in 07/2019) who presents to the office today for 10-month follow-up.  He was last examined by Dr. Wyline Mood in 07/2019 following a recent admission for hypertensive urgency and found to have a reduced EF of 40 to 45% during admission.  He did report increased fatigue while on Coreg but denied any chest pain or dyspnea on exertion.  Was recommended to try reducing Coreg to 12.5 mg twice daily and if ongoing fatigue, could transition to Toprol-XL.  Lisinopril was increased to 40 mg daily and a Lexiscan Myoview was recommended for ischemic evaluation.  His stress test was performed in 08/2019 and showed finding consistent with a prior inferior/inferior septal infarction with no current myocardium at jeopardy.  The office was notified by Dr. Henreitta Leber in 08/2018 that his BP had been significantly elevated at the time of his visit.  His fatigue had not changed when Coreg was reduced in the past, therefore was recommend he go back to 25 mg twice daily and continue to follow BP at home.    Cleda Daub?  Past Medical History:  Diagnosis Date  . Arthritis   . Diverticulitis   . GERD (gastroesophageal reflux disease)   . HTN (hypertension)     Past Surgical History:  Procedure Laterality Date  . BIOPSY  03/04/2019   Procedure: BIOPSY;  Surgeon: Corbin Ade, MD;  [facility];  Service: Endoscopy;;  gastric  . COLONOSCOPY    . COLONOSCOPY WITH PROPOFOL N/A 03/04/2019   Procedure: COLONOSCOPY WITH PROPOFOL;  Surgeon: Corbin Ade, MD;  [facility];  Service: Endoscopy;   Laterality: N/A;  7:30am  . ESOPHAGOGASTRODUODENOSCOPY (EGD) WITH PROPOFOL N/A 03/04/2019   Procedure: ESOPHAGOGASTRODUODENOSCOPY (EGD) WITH PROPOFOL;  Surgeon: Corbin Ade, MD;  [facility];  Service: Endoscopy;  Laterality: N/A;  . LAPAROSCOPIC PARTIAL COLECTOMY N/A 07/14/2019   Procedure: LAPAROSCOPIC  LEFT HEMICOLECTOMY;  Surgeon: Lucretia Roers, MD;  Location: [facility];  Service: General;  Laterality: N/A;    Current Medications: Outpatient Medications Prior to Visit  Medication Sig Dispense Refill  . amLODipine (NORVASC) 10 MG tablet Take 1 tablet (10 mg total) by mouth daily. For Blood Pressure 30 tablet 5  . carvedilol (COREG) 25 MG tablet Take 1 tablet (25 mg total) by mouth 2 (two) times daily. 180 tablet 3  . lisinopril (ZESTRIL) 40 MG tablet Take 1 tablet (40 mg total) by mouth daily. 90 tablet 3  . pantoprazole (PROTONIX) 40 MG tablet Take 1 tablet (40 mg total) by mouth 2 (two) times daily. (Patient taking differently: Take 40 mg by mouth as needed. ) 60 tablet 0  . sucralfate (CARAFATE) 1 g tablet Take 1 tablet (1 g total) by mouth 4 (four) times daily -  with meals and at bedtime. (Patient taking differently: Take 1 g by mouth 4 (four) times daily -  with meals and at bedtime. As needed) 120 tablet 0   No facility-administered medications prior to visit.     Allergies:   Other   Social History  Socioeconomic History  . Marital status: Single    Spouse name: Not on file  . Number of children: Not on file  . Years of education: Not on file  . Highest education level: Not on file  Occupational History  . Not on file  Tobacco Use  . Smoking status: Current Every Day Smoker    Packs/day: 0.50    Years: 25.00    Pack years: 12.50    Types: Cigarettes  . Smokeless tobacco: Never Used  Vaping Use  . Vaping Use: Never used  Substance and Sexual Activity  . Alcohol use: No  . Drug use: No  . Sexual activity: Yes  Other Topics Concern  . Not on file    Social History Narrative  . Not on file   Social Determinants of Health   Financial Resource Strain:   . Difficulty of Paying Living Expenses: Not on file  Food Insecurity:   . Worried About Programme researcher, broadcasting/film/video in the Last Year: Not on file  . Ran Out of Food in the Last Year: Not on file  Transportation Needs:   . Lack of Transportation (Medical): Not on file  . Lack of Transportation (Non-Medical): Not on file  Physical Activity:   . Days of Exercise per Week: Not on file  . Minutes of Exercise per Session: Not on file  Stress:   . Feeling of Stress : Not on file  Social Connections:   . Frequency of Communication with Friends and Family: Not on file  . Frequency of Social Gatherings with Friends and Family: Not on file  . Attends Religious Services: Not on file  . Active Member of Clubs or Organizations: Not on file  . Attends Banker Meetings: Not on file  . Marital Status: Not on file     Family History:  The patient's ***family history includes Lung cancer in his father.   Review of Systems:   Please see the history of present illness.     General:  No chills, fever, night sweats or weight changes.  Cardiovascular:  No chest pain, dyspnea on exertion, edema, orthopnea, palpitations, paroxysmal nocturnal dyspnea. Dermatological: No rash, lesions/masses Respiratory: No cough, dyspnea Urologic: No hematuria, dysuria Abdominal:   No nausea, vomiting, diarrhea, bright red blood per rectum, melena, or hematemesis Neurologic:  No visual changes, wkns, changes in mental status. All other systems reviewed and are otherwise negative except as noted above.   Physical Exam:    VS:  There were no vitals taken for this visit.   General: Well developed, well nourished,male appearing in no acute distress. Head: Normocephalic, atraumatic. Neck: No carotid bruits. JVD not elevated.  Lungs: Respirations regular and unlabored, without wheezes or rales.  Heart:  ***Regular rate and rhythm. No S3 or S4.  No murmur, no rubs, or gallops appreciated. Abdomen: Appears non-distended. No obvious abdominal masses. Msk:  Strength and tone appear normal for age. No obvious joint deformities or effusions. Extremities: No clubbing or cyanosis. No edema.  Distal pedal pulses are 2+ bilaterally. Neuro: Alert and oriented X 3. Moves all extremities spontaneously. No focal deficits noted. Psych:  Responds to questions appropriately with a normal affect. Skin: No rashes or lesions noted  Wt Readings from Last 3 Encounters:  08/27/19 156 lb (70.8 kg)  08/07/19 155 lb (70.3 kg)  08/06/19 155 lb (70.3 kg)        Studies/Labs Reviewed:   EKG:  EKG is*** ordered today.  The ekg ordered  today demonstrates ***  Recent Labs: 07/16/2019: BUN 13; Creatinine, Ser 0.96; Magnesium 1.8; Potassium 4.0; Sodium 138 07/17/2019: Hemoglobin 9.4; Platelets 205   Lipid Panel    Component Value Date/Time   CHOL 107 07/16/2019 0552   TRIG 95 07/16/2019 0552   HDL 34 (L) 07/16/2019 0552   CHOLHDL 3.1 07/16/2019 0552   VLDL 19 07/16/2019 0552   LDLCALC 54 07/16/2019 0552    Additional studies/ records that were reviewed today include:   Echocardiogram: 07/2019 IMPRESSIONS    1. Left ventricular ejection fraction, by estimation, is 40 to 45%. The  left ventricle has mildly decreased function. The left ventricle  demonstrates regional wall motion abnormalities (see scoring  diagram/findings for description). There is mild left  ventricular hypertrophy. Left ventricular diastolic parameters are  consistent with Grade II diastolic dysfunction (pseudonormalization).  2. Right ventricular systolic function is normal. The right ventricular  size is normal. There is normal pulmonary artery systolic pressure. The  estimated right ventricular systolic pressure is 22.8 mmHg.  3. Left atrial size was mildly dilated.  4. The mitral valve is grossly normal. Mild mitral valve  regurgitation.  5. The aortic valve is tricuspid. Aortic valve regurgitation is not  visualized.  6. Aortic root is mildly ectatic.   NST: 08/2019  There was no ST segment deviation noted during stress.  Findings consistent with large prior inferior/inferoseptal myocardial infarction.  This is a high risk study. Risk is based on decreased LVEF, there is prior infarct without myocardium currently at jeopardy. Consider correlating LVEF with echo to better assess risk.  The left ventricular ejection fraction is moderately decreased (30%).  Assessment:    No diagnosis found.   Plan:   In order of problems listed above:  1. ***    Medication Adjustments/Labs and Tests Ordered: Current medicines are reviewed at length with the patient today.  Concerns regarding medicines are outlined above.  Medication changes, Labs and Tests ordered today are listed in the Patient Instructions below. There are no Patient Instructions on file for this visit.   Signed, Ellsworth Lennox, PA-C  10/30/2019 12:46 PM    [facility] [address] [place], [place] [address] Phone: [phone] Fax: [phone]

## 2019-12-31 ENCOUNTER — Other Ambulatory Visit: Payer: Self-pay | Admitting: Family Medicine

## 2019-12-31 DIAGNOSIS — K5732 Diverticulitis of large intestine without perforation or abscess without bleeding: Secondary | ICD-10-CM

## 2019-12-31 MED ORDER — SUCRALFATE 1 G PO TABS: 1.0000 g | ORAL_TABLET | Freq: Three times a day (TID) | ORAL | 0 refills | Status: DC

## 2020-01-25 ENCOUNTER — Other Ambulatory Visit: Payer: Self-pay | Admitting: General Surgery

## 2020-02-05 ENCOUNTER — Other Ambulatory Visit: Payer: Self-pay | Admitting: Family Medicine

## 2020-02-05 DIAGNOSIS — K56699 Other intestinal obstruction unspecified as to partial versus complete obstruction: Secondary | ICD-10-CM

## 2020-02-05 MED ORDER — SUCRALFATE 1 G PO TABS: 1.0000 g | ORAL_TABLET | Freq: Three times a day (TID) | ORAL | 0 refills | Status: DC

## 2020-02-07 IMAGING — DX RIGHT SHOULDER - 2+ VIEW
3 series · 3 of 3 positions shown · non-contrast
Comparison: None.

CLINICAL DATA: Shoulder pain, no known injury, initial encounter

EXAM:
RIGHT SHOULDER - 2+ VIEW

[shoulder grashey]
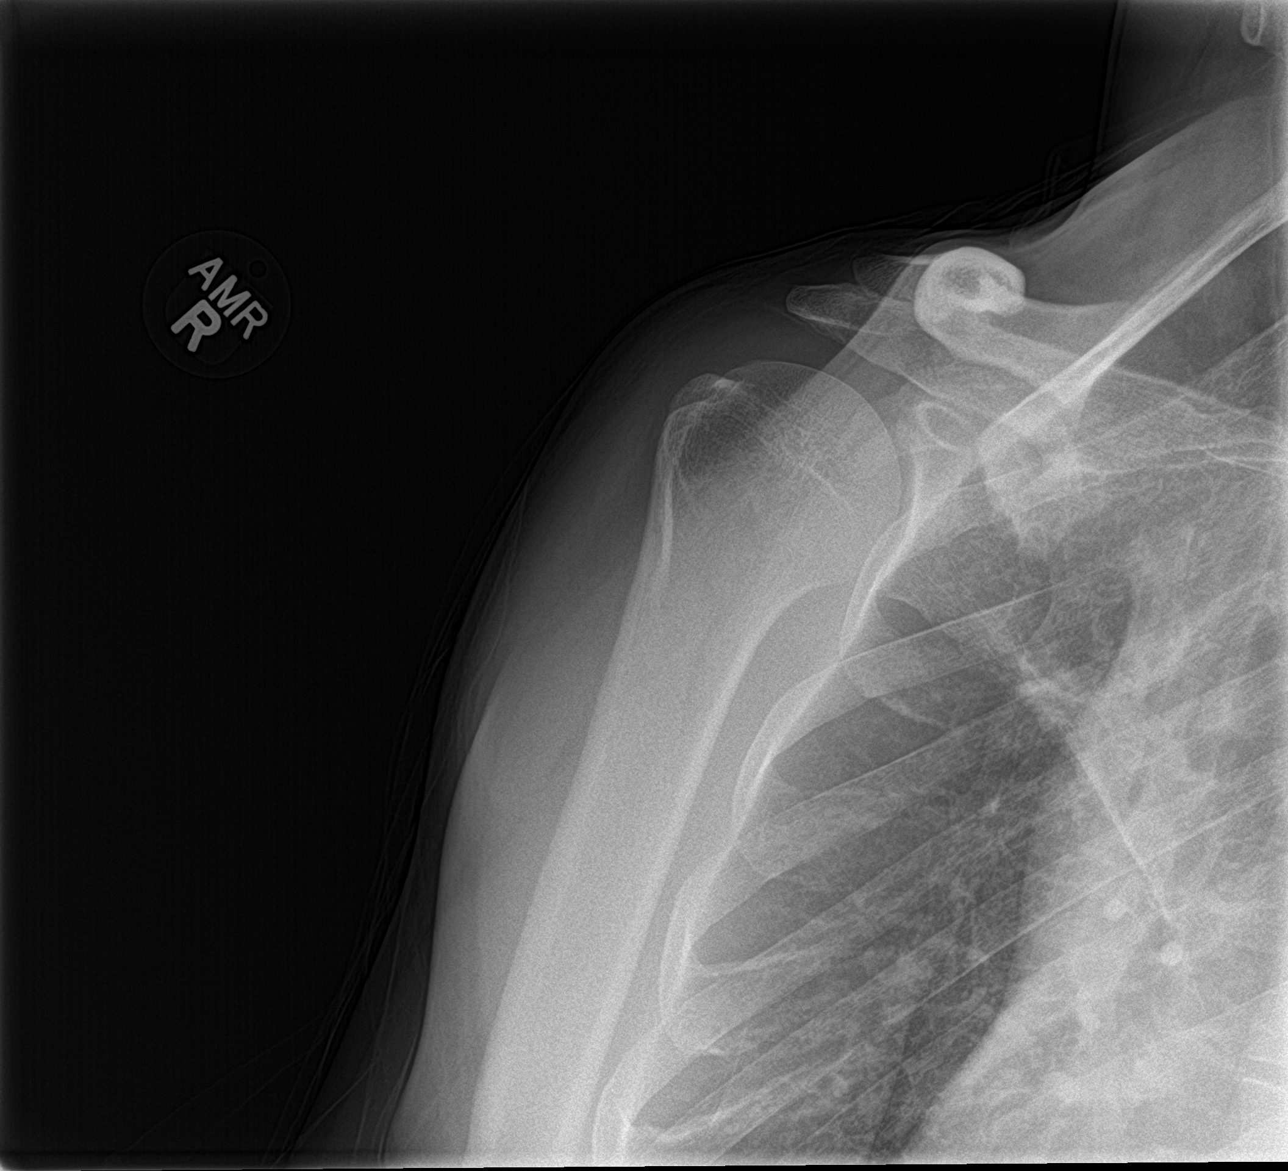

[shoulder y view]
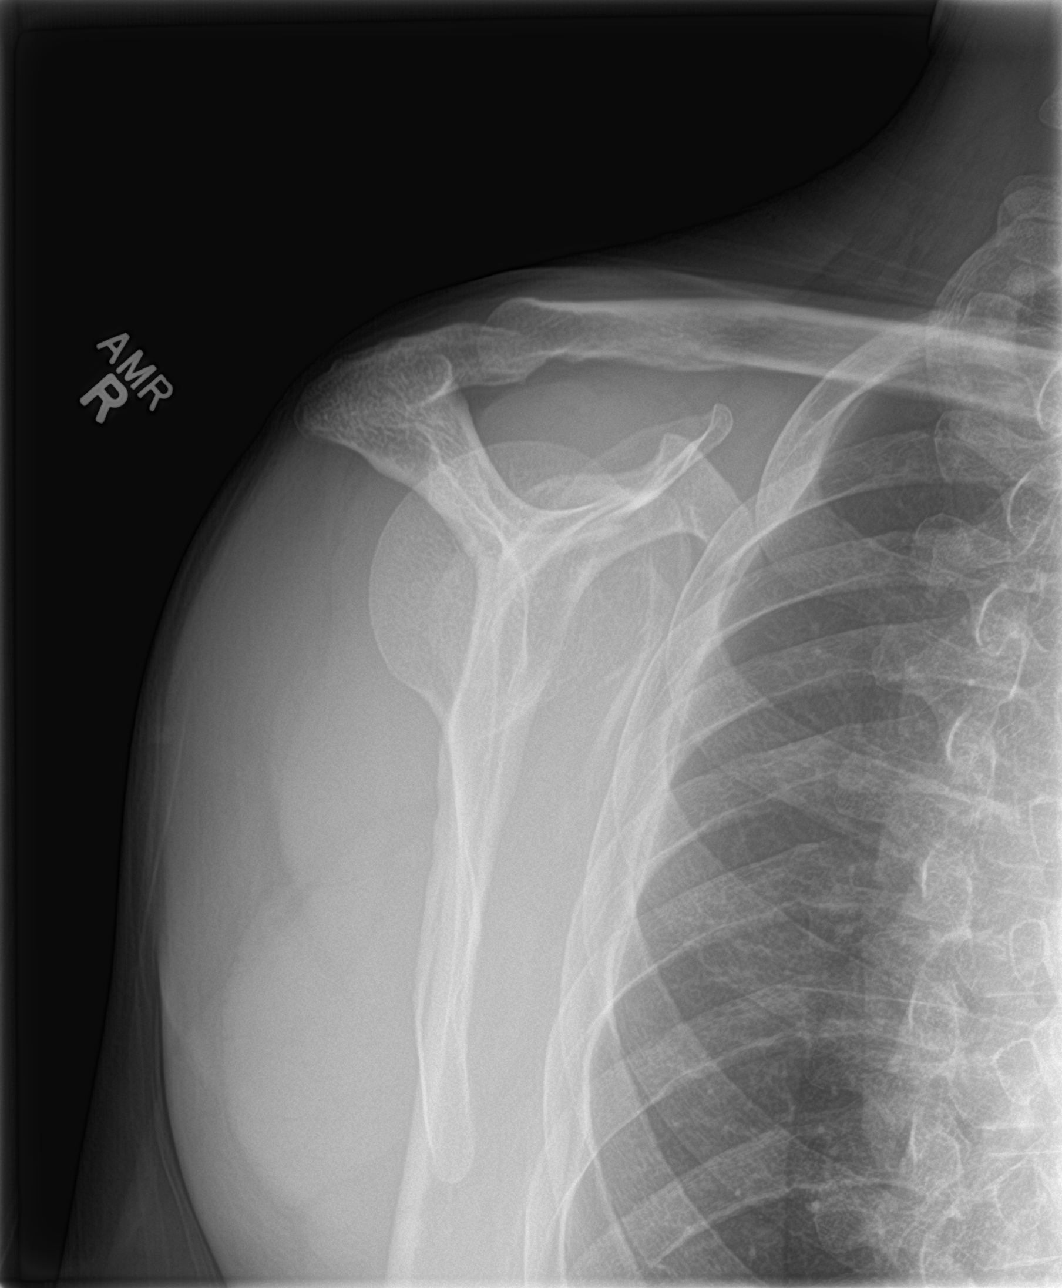

[shoulder axillary]
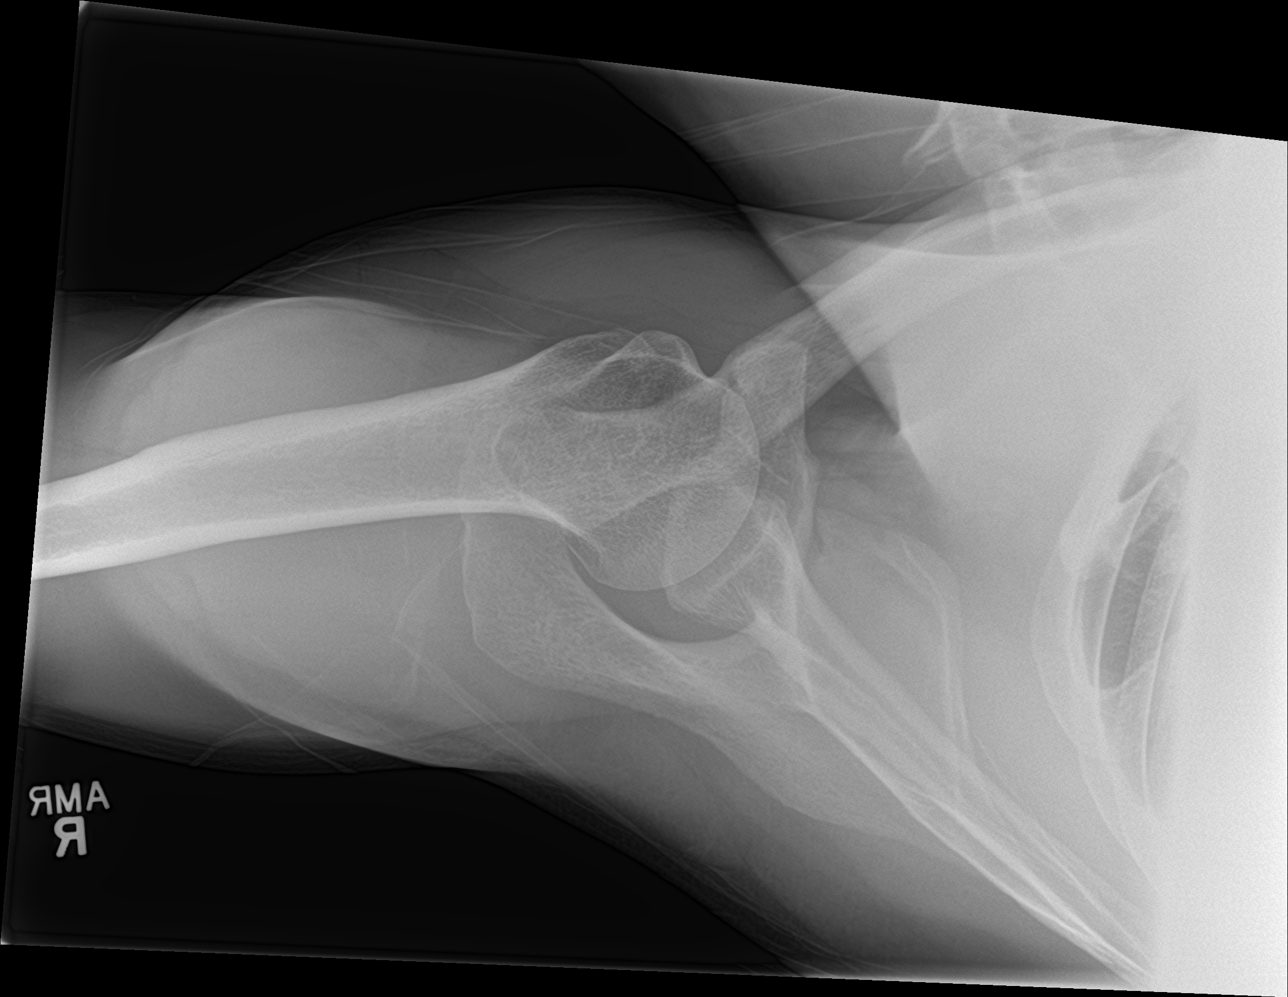

[3 of 3 positions shown; findings below may reference images not displayed]

FINDINGS: There is no evidence of fracture or dislocation. There is no
evidence of arthropathy or other focal bone abnormality. Soft
tissues are unremarkable.
IMPRESSION: No acute abnormality noted.

## 2020-02-25 ENCOUNTER — Other Ambulatory Visit: Payer: Self-pay | Admitting: General Surgery

## 2020-04-20 ENCOUNTER — Ambulatory Visit: Payer: Medicaid Other | Admitting: Nurse Practitioner

## 2020-04-20 ENCOUNTER — Encounter: Payer: Self-pay | Admitting: Nurse Practitioner

## 2020-04-20 ENCOUNTER — Other Ambulatory Visit: Payer: Self-pay

## 2020-04-20 VITALS — BP 146/99 | HR 87 | Temp 97.8°F | Ht 72.0 in | Wt 167.6 lb

## 2020-04-20 DIAGNOSIS — K921 Melena: Secondary | ICD-10-CM

## 2020-04-20 DIAGNOSIS — R1030 Lower abdominal pain, unspecified: Secondary | ICD-10-CM | POA: Diagnosis not present

## 2020-04-20 DIAGNOSIS — K5900 Constipation, unspecified: Secondary | ICD-10-CM

## 2020-04-20 DIAGNOSIS — Z8719 Personal history of other diseases of the digestive system: Secondary | ICD-10-CM | POA: Diagnosis not present

## 2020-04-20 MED ORDER — SUCRALFATE 1 G PO TABS: 1.0000 g | ORAL_TABLET | Freq: Three times a day (TID) | ORAL | 5 refills | Status: DC

## 2020-04-20 NOTE — Progress Notes (Signed)
Referring Provider: Waldon Reining, MD Primary Care Physician:  Waldon Reining, MD Primary GI:  Dr. Jena Gauss  Chief Complaint  Patient presents with  . Diverticulitis    Reports had BRB in stool on Monday.   . Abdominal Pain    Lower abd, can be sharp, stabby, burning    HPI:   Anthony Vaughn is a 50 y.o. male who presents for follow-up on diverticulitis and GERD.  The patient was last seen in our office via video visit 10/21/2019 for diverticulitis, sigmoid stricture, abdominal pain, GERD.  Noted history of diverticulosis/diverticulitis, epigastric pain in the setting of NSAIDs improved after PPI.  Previously recommended EGD and colonoscopy but he canceled/no-show for his Covid testing.  Recently had 2 visits to [facility] with [facility] showing diverticulitis but "maybe something else as well" discharged on antibiotics due to high census.  Pain persisted despite antibiotic completion.  CT findings included diverticulitis x2 episodes with persistent symptoms so he was prescribed Augmentin 875 mg 3 times a day for 10 days with some noted improvement.  He was endoscopically evaluated with EGD on 03/04/2019 with gastric mucosal changes found to be chronic gastritis and focal intestinal metaplasia negative for H. pylori and otherwise normal. Recommended continue PPI and avoid all NSAIDs.  Colonoscopy the same day with diverticulosis in the entire colon with a stenotic, stiff, chronically inflamed sigmoid segment.  Recommended Benefiber and low threshold for surgical consultation and repeat colonoscopy in 10 years.  He underwent a partial colectomy with Dr. Larae Grooms on 07/14/2019 which found a hardened area of sigmoid colon diverticula in the descending colon.  Noted relatively uncomplicated postoperative course.  At his last visit notes that he feels great, rare/occasional nausea or loose stools, pain resolved, healing well.  GERD doing well on PPI with rare breakthrough.  No other overt  GI complaints.  Recommended continue current medications, follow-up in 6 months, call for any worsening.  Today states doing okay overall. Today he states about once a month he'll have hematochezia. Most recently after ibuprofen for mouth surgery. He uses Carafate which helps his pain. Pain is much better than it was prior to surgery. Possible discomfort due to scar tissue. He does have intermittent constipation, typically goes daily, some hard stools with straining. Used Senakot or dulcolax which helps. Has some nausea, no GERD symptoms. No vomiting. Denies melena, fever, chills, unintentional weight loss. Denies URI or flu-like symptoms. Denies loss of sense of taste or smell. The patient has not received COVID-19 vaccination(s). Denies chest pain, dyspnea, dizziness, lightheadedness, syncope, near syncope. Denies any other upper or lower GI symptoms.  Past Medical History:  Diagnosis Date  . Arthritis   . Diverticulitis   . GERD (gastroesophageal reflux disease)   . HTN (hypertension)     Past Surgical History:  Procedure Laterality Date  . BIOPSY  03/04/2019   Procedure: BIOPSY;  Surgeon: Corbin Ade, MD;  [facility];  Service: Endoscopy;;  gastric  . COLONOSCOPY    . COLONOSCOPY WITH PROPOFOL N/A 03/04/2019   Procedure: COLONOSCOPY WITH PROPOFOL;  Surgeon: Corbin Ade, MD;  [facility];  Service: Endoscopy;  Laterality: N/A;  7:30am  . ESOPHAGOGASTRODUODENOSCOPY (EGD) WITH PROPOFOL N/A 03/04/2019   Procedure: ESOPHAGOGASTRODUODENOSCOPY (EGD) WITH PROPOFOL;  Surgeon: Corbin Ade, MD;  [facility];  Service: Endoscopy;  Laterality: N/A;  . LAPAROSCOPIC PARTIAL COLECTOMY N/A 07/14/2019   Procedure: LAPAROSCOPIC  LEFT HEMICOLECTOMY;  Surgeon: Lucretia Roers, MD;  Location: [facility];  Service: General;  Laterality: N/A;    Current Outpatient Medications  Medication Sig Dispense Refill  . amLODipine (NORVASC) 10 MG tablet Take 1 tablet (10 mg  total) by mouth daily. For Blood Pressure 30 tablet 5  . amoxicillin (AMOXIL) 875 MG tablet Dental surgery    . carvedilol (COREG) 25 MG tablet Take 1 tablet (25 mg total) by mouth 2 (two) times daily. 180 tablet 3  . ibuprofen (ADVIL) 600 MG tablet Take 600 mg by mouth every 6 (six) hours as needed. Dental surgery done friday    . lisinopril (ZESTRIL) 40 MG tablet Take 1 tablet (40 mg total) by mouth daily. 90 tablet 3  . pantoprazole (PROTONIX) 40 MG tablet Take 1 tablet (40 mg total) by mouth 2 (two) times daily. (Patient taking differently: Take 40 mg by mouth as needed.) 60 tablet 0  . sucralfate (CARAFATE) 1 g tablet Take 1 tablet (1 g total) by mouth 4 (four) times daily -  with meals and at bedtime. As needed (Patient not taking: Reported on 04/20/2020) 120 tablet 0   No current facility-administered medications for this visit.    Allergies as of 04/20/2020 - Review Complete 04/20/2020  Allergen Reaction Noted  . Other  02/04/2019    Family History  Problem Relation Age of Onset  . Lung cancer Father   . Colon cancer Neg Hx   . Colon polyps Neg Hx     Social History   Socioeconomic History  . Marital status: Single    Spouse name: Not on file  . Number of children: Not on file  . Years of education: Not on file  . Highest education level: Not on file  Occupational History  . Not on file  Tobacco Use  . Smoking status: Current Every Day Smoker    Packs/day: 0.50    Years: 25.00    Pack years: 12.50    Types: Cigarettes  . Smokeless tobacco: Never Used  Vaping Use  . Vaping Use: Never used  Substance and Sexual Activity  . Alcohol use: No  . Drug use: No  . Sexual activity: Yes  Other Topics Concern  . Not on file  Social History Narrative  . Not on file   Social Determinants of Health   Financial Resource Strain: Not on file  Food Insecurity: Not on file  Transportation Needs: Not on file  Physical Activity: Not on file  Stress: Not on file  Social  Connections: Not on file    Subjective: Review of Systems  Constitutional: Negative for chills, fever, malaise/fatigue and weight loss.  HENT: Negative for congestion and sore throat.   Respiratory: Negative for cough and shortness of breath.   Cardiovascular: Negative for chest pain and palpitations.  Gastrointestinal: Positive for abdominal pain, blood in stool, constipation and nausea. Negative for diarrhea, melena and vomiting.  Musculoskeletal: Negative for joint pain and myalgias.  Skin: Negative for rash.  Neurological: Negative for dizziness and weakness.  Endo/Heme/Allergies: Does not bruise/bleed easily.  Psychiatric/Behavioral: Negative for depression. The patient is not nervous/anxious.   All other systems reviewed and are negative.    Objective: BP (!) 146/99   Pulse 87   Temp 97.8 F (36.6 C)   Ht 6' (1.829 m)   Wt 167 lb 9.6 oz (76 kg)   BMI 22.73 kg/m  Physical Exam Vitals and nursing note reviewed.  Constitutional:      General: He is not in acute distress.  Appearance: Normal appearance. He is well-developed and normal weight. He is not ill-appearing, toxic-appearing or diaphoretic.  HENT:     Head: Normocephalic and atraumatic.     Nose: No congestion or rhinorrhea.  Eyes:     General: No scleral icterus. Cardiovascular:     Rate and Rhythm: Normal rate and regular rhythm.     Heart sounds: Normal heart sounds.  Pulmonary:     Effort: Pulmonary effort is normal.     Breath sounds: Normal breath sounds.  Abdominal:     General: Bowel sounds are normal. There is no distension.     Palpations: Abdomen is soft. There is no hepatomegaly, splenomegaly or mass.     Tenderness: There is no abdominal tenderness. There is no guarding or rebound.     Hernia: No hernia is present.  Musculoskeletal:     Cervical back: Neck supple.  Skin:    General: Skin is warm and dry.     Coloration: Skin is not jaundiced.     Findings: No bruising or rash.   Neurological:     General: No focal deficit present.     Mental Status: He is alert and oriented to person, place, and time. Mental status is at baseline.  Psychiatric:        Mood and Affect: Mood normal.        Behavior: Behavior normal.        Thought Content: Thought content normal.      Assessment:  Very pleasant 50 year old male presents for follow-up on abdominal pain, diverticulosis/diverticulitis.  He notes he does still have some abdominal discomfort, although he seems to have some constipation associated with this.  He has had a couple episodes of hematochezia associated with his constipation.  Colonoscopy completed about 1 year ago was reassuring.  No other red flag/warning signs or symptoms.  Diverticulosis/diverticulitis: Pancolonic diverticula with a somewhat stenotic, firm sigmoid colon status post colon resection.  He states he is significantly improved.  At this point I feel his abdominal discomfort and occasional scant hematochezia is more likely constipation and straining rather than diverticular in origin.  Recommend he continue to monitor and notify us of any worsening or severe symptoms  Constipation with straining and occasional hematochezia: He has a bowel movement about 1 or 2 times a day, notes hard stools and straining.  He will occasionally take a Dulcolax or Senokot which helps him have a bowel movement the next day but then will begin developing constipation as well.  Has not tried any other medications.  I feel his hematochezia is benign anorectal source given his colonoscopy about 1 year ago which is reassuring.  I will work to better control his constipation and see if we get improvement in his overall global symptoms.   Plan: 1. Start Colace 100 mg once daily 2. MiraLAX 1-2 times a day as needed for breakthrough constipation 3. Notify us of any worsening or severe symptoms 4. Follow-up in 6 months otherwise    Thank you for allowing [place] to participate in  the care of Anthony Reusing, DNP, AGNP-C Adult & Gerontological Nurse Practitioner [facility]   04/20/2020 10:52 AM   Disclaimer: This note was dictated with voice recognition software. Similar sounding words can inadvertently be transcribed and may not be corrected upon review.

## 2020-04-20 NOTE — Patient Instructions (Signed)
Your health issues we discussed today were:   Abdominal pain with constipation and rectal bleeding: 1. As we discussed, I feel your symptoms are more likely related to your constipation rather than the diverticulitis 2. Start taking Colace 100 mg (over-the-counter stool softener) once a day 3. You can add MiraLAX 1 capful once a day to twice a day, as needed for persistent constipation 4. The goal is to have soft, productive bowel movements with complete emptying 5. Call us for any worsening or severe symptoms  History of diverticulosis and diverticulitis: 1. I am glad you are doing better since surgery! 2. Call us if you have any worsening or severe symptoms  Overall I recommend:  1. Continue other current medications 2. Return for follow-up in 6 months 3. Call us for any questions or concerns   ---------------------------------------------------------------  At [facility] we value your feedback. You may receive a survey about your visit today. Please share your experience as we strive to create trusting relationships with our patients to provide genuine, compassionate, quality care.  We appreciate your understanding and patience as we review any laboratory studies, imaging, and other diagnostic tests that are ordered as we care for you. Our office policy is 5 business days for review of these results, and any emergent or urgent results are addressed in a timely manner for your best interest. If you do not hear from our office in 1 week, please contact us.   We also encourage the use of MyChart, which contains your medical information for your review as well. If you are not enrolled in this feature, an access code is on this after visit summary for your convenience. Thank you for allowing [place] to be involved in your care.  It was great to see you today!  I hope you have a great spring!!    ---------------------------------------------------------------

## 2020-04-20 NOTE — Addendum Note (Signed)
Addended by: Delane Ginger, Khadim Lundberg A on: 04/20/2020 11:11 AM   Modules accepted: Orders

## 2020-05-27 ENCOUNTER — Telehealth: Payer: Self-pay | Admitting: Internal Medicine

## 2020-05-27 DIAGNOSIS — K219 Gastro-esophageal reflux disease without esophagitis: Secondary | ICD-10-CM

## 2020-05-27 MED ORDER — LIDOCAINE VISCOUS HCL 2 % MT SOLN: 15.0000 mL | Freq: Four times a day (QID) | OROMUCOSAL | 1 refills | Status: DC | PRN

## 2020-05-27 NOTE — Addendum Note (Signed)
Addended by: Delane Ginger, Cerina Leary A on: 05/27/2020 03:10 PM   Modules accepted: Orders

## 2020-05-27 NOTE — Telephone Encounter (Signed)
Pt phoned stating he has taken all his Carafate and the pharmacy will not give him another refill before the time. Pt state he's having a lot of nausea and his pain level is about a 4. The pt states he needs something phoned in.

## 2020-05-27 NOTE — Telephone Encounter (Signed)
Phoned and LM on pts vm regarding Rx being phoned in to his pharmacy and the instructions given by Wynne Dust.

## 2020-05-27 NOTE — Telephone Encounter (Signed)
I will send in viscous lidocaine solution.  He can take 15 mL up to 4 times a day as needed for esophagus pain.  Call if any other issues obtaining medication or if they will not refill Carafate in 2 weeks.

## 2020-05-27 NOTE — Telephone Encounter (Signed)
PLEASE CALL PATIENT, HE NEEDS ANOTHER PRESCRIPTION SENT IN FOR REFLUX   HE HAS TAKEN THE ONE HE HAS BECAUSE IT SAID TAKE AS NEEDED AND HE CAN NOT GET A REFILL THIS SOON.  WANTS ANOTHER PRESCRIPTION SENT IN THAT DOES THE SAME THING TO HOLD HIM OVER UNTIL HER CAN GET THE ONE HE IS ON REFILLED

## 2020-05-30 ENCOUNTER — Other Ambulatory Visit: Payer: Self-pay | Admitting: Nurse Practitioner

## 2020-05-30 DIAGNOSIS — K219 Gastro-esophageal reflux disease without esophagitis: Secondary | ICD-10-CM

## 2020-06-29 ENCOUNTER — Other Ambulatory Visit: Payer: Self-pay

## 2020-06-29 ENCOUNTER — Telehealth: Payer: Self-pay | Admitting: Internal Medicine

## 2020-06-29 DIAGNOSIS — K219 Gastro-esophageal reflux disease without esophagitis: Secondary | ICD-10-CM

## 2020-06-29 MED ORDER — LIDOCAINE VISCOUS HCL 2 % MT SOLN: 15.0000 mL | Freq: Four times a day (QID) | OROMUCOSAL | 1 refills | Status: DC | PRN

## 2020-06-29 NOTE — Telephone Encounter (Signed)
Sent to [facility] refill pool for Viscous Lidocaine Solution

## 2020-06-29 NOTE — Telephone Encounter (Signed)
Pt asked for a refill of a prescription Wynne Dust, NP had written him, but doesn't remember what it was other than it was a liquid. He uses [facility].

## 2020-06-29 NOTE — Telephone Encounter (Signed)
noted 

## 2020-07-23 ENCOUNTER — Other Ambulatory Visit: Payer: Self-pay | Admitting: Gastroenterology

## 2020-07-23 DIAGNOSIS — K219 Gastro-esophageal reflux disease without esophagitis: Secondary | ICD-10-CM

## 2020-07-26 ENCOUNTER — Telehealth: Payer: Self-pay

## 2020-07-26 ENCOUNTER — Telehealth: Payer: Self-pay | Admitting: Internal Medicine

## 2020-07-26 NOTE — Telephone Encounter (Signed)
Pt was asking for another refill on his prescription of viscous lidocaine sol.Anthony Vaughn He uses [facility]. [phone]

## 2020-07-26 NOTE — Telephone Encounter (Signed)
The pt wants more Lidocaine 2%. You sent in Rx for it on May 24th. He used that and the refill he got it filled 5/31. Pt would like to have more of it sent in. Please advise

## 2020-07-26 NOTE — Telephone Encounter (Signed)
Sent to [facility] already

## 2020-07-27 NOTE — Telephone Encounter (Signed)
Noted. It is in refill box.

## 2020-08-18 ENCOUNTER — Other Ambulatory Visit: Payer: Self-pay | Admitting: Cardiology

## 2020-09-15 ENCOUNTER — Telehealth: Payer: Self-pay | Admitting: Cardiology

## 2020-09-15 NOTE — Telephone Encounter (Signed)
Let message for pt to return call to our office.

## 2020-09-15 NOTE — Telephone Encounter (Signed)
Patient called stating that he was seen in the ER at [facility]. They wanted to transfer patient to [facility]. Patient did not want to be transferred. Appointment has been made for tomorrow 09-16-2020 with Dr. Wyline Mood in the [place] office. Patient was concerned about waiting until tomorrow or should he go to the ER at [facility].

## 2020-09-16 ENCOUNTER — Ambulatory Visit (INDEPENDENT_AMBULATORY_CARE_PROVIDER_SITE_OTHER): Payer: Medicaid Other | Admitting: Cardiology

## 2020-09-16 ENCOUNTER — Encounter: Payer: Self-pay | Admitting: Cardiology

## 2020-09-16 ENCOUNTER — Telehealth: Payer: Self-pay | Admitting: Internal Medicine

## 2020-09-16 ENCOUNTER — Encounter: Payer: Self-pay | Admitting: *Deleted

## 2020-09-16 VITALS — BP 150/96 | HR 95 | Ht 72.0 in | Wt 168.0 lb

## 2020-09-16 DIAGNOSIS — I5022 Chronic systolic (congestive) heart failure: Secondary | ICD-10-CM

## 2020-09-16 DIAGNOSIS — I471 Supraventricular tachycardia: Secondary | ICD-10-CM | POA: Diagnosis not present

## 2020-09-16 MED ORDER — SUCRALFATE 1 G PO TABS: 1.0000 g | ORAL_TABLET | Freq: Four times a day (QID) | ORAL | 3 refills | Status: DC | PRN

## 2020-09-16 MED ORDER — METOPROLOL SUCCINATE ER 50 MG PO TB24: 50.0000 mg | ORAL_TABLET | Freq: Every day | ORAL | 6 refills | Status: DC

## 2020-09-16 NOTE — Telephone Encounter (Signed)
Pt is requesting a refill on he sucralfate 1 g tablet to get him to his appointment in Sept. Sent to [facility].

## 2020-09-16 NOTE — Progress Notes (Signed)
Clinical Summary Anthony Vaughn is a 50 y.o.male seen today as a new patient for the following medical problems.   This is a focused visit on recent ER admission with palpitations, SOB.    1. Palpitations/SVT - ER visit yesterday with palpitations, nausea, severe SOB - from notes EMS found him to be in SVT, given rounds of adenosine without improvement - ER notes mention EKG with SVT rate 200 - developed some hypotension, required cardioversion -after cardioversion discussions for transfer to [facility] main campus but patient signed out AMA   - mixed compliance with coreg,. Only taking coreg 1-2 times per week. Reports it causes SOb, fatigue - Free T4 0.79 TSH 1.18 Mg 1.6 K 3.7 Cr 1.39 Hstrop 131-->279 CXR no acute process    2. HTN - admitted 07/2019 with severe HTN, started on cardene drip - compliant with meds since discharge - home SBPs 140s.      3. Chronic systolic HF - echo during admission LVEF 40-45%, inferior wall akinetic, basal inferolateral and apical inferior are hypokinetic.    - reports on coreg some fatigue in the past.Recurrent symptoms on higher dose.  - no recent chest pains - no exertional symptoms      4. History of diverticulitis - recent hemicolectomy 07/2019  5. Presumed CAD - 08/2019 nuclear stress: large iferior infarct, no ischemia - 07/2019 echo inferior wall is akinetic.      Past Medical History:  Diagnosis Date   Arthritis    Diverticulitis    GERD (gastroesophageal reflux disease)    HTN (hypertension)      Allergies  Allergen Reactions   Other     States he's allergic to a blood pressure pill that causes abdominal pain. He's not sure name of medication     Current Outpatient Medications  Medication Sig Dispense Refill   amLODipine (NORVASC) 10 MG tablet Take 1 tablet (10 mg total) by mouth daily. For Blood Pressure 30 tablet 5   amoxicillin (AMOXIL) 875 MG tablet Dental surgery     carvedilol (COREG) 25 MG tablet Take 1  tablet (25 mg total) by mouth 2 (two) times daily. 180 tablet 3   ibuprofen (ADVIL) 600 MG tablet Take 600 mg by mouth every 6 (six) hours as needed. Dental surgery done friday     lidocaine (XYLOCAINE) 2 % solution USE AS DIRECTED; 15 MLS in THE MOUTH OR throat EVERY 6 HOURS AS NEEDED FOR UP TO FOUR TIMES DAILY 300 mL 1   lisinopril (ZESTRIL) 40 MG tablet TAKE 1 TABLET BY MOUTH EVERY DAY 30 tablet 0   pantoprazole (PROTONIX) 40 MG tablet Take 1 tablet (40 mg total) by mouth 2 (two) times daily. (Patient taking differently: Take 40 mg by mouth as needed.) 60 tablet 0   sucralfate (CARAFATE) 1 g tablet Take 1 tablet (1 g total) by mouth 4 (four) times daily -  with meals and at bedtime. As needed 120 tablet 5   No current facility-administered medications for this visit.     Past Surgical History:  Procedure Laterality Date   BIOPSY  03/04/2019   Procedure: BIOPSY;  Surgeon: Corbin Ade, MD;  [facility];  Service: Endoscopy;;  gastric   COLONOSCOPY     COLONOSCOPY WITH PROPOFOL N/A 03/04/2019   Procedure: COLONOSCOPY WITH PROPOFOL;  Surgeon: Corbin Ade, MD;  [facility];  Service: Endoscopy;  Laterality: N/A;  7:30am   ESOPHAGOGASTRODUODENOSCOPY (EGD) WITH PROPOFOL N/A 03/04/2019  Procedure: ESOPHAGOGASTRODUODENOSCOPY (EGD) WITH PROPOFOL;  Surgeon: Corbin Ade, MD;  [facility];  Service: Endoscopy;  Laterality: N/A;   LAPAROSCOPIC PARTIAL COLECTOMY N/A 07/14/2019   Procedure: LAPAROSCOPIC  LEFT HEMICOLECTOMY;  Surgeon: Lucretia Roers, MD;  Location: [facility];  Service: General;  Laterality: N/A;     Allergies  Allergen Reactions   Other     States he's allergic to a blood pressure pill that causes abdominal pain. He's not sure name of medication      Family History  Problem Relation Age of Onset   Lung cancer Father    Colon cancer Neg Hx    Colon polyps Neg Hx      Social History Mr. Mainwaring reports that he has been smoking  cigarettes. He has a 12.50 pack-year smoking history. He has never used smokeless tobacco. Mr. Konopka reports no history of alcohol use.   Review of Systems CONSTITUTIONAL: No weight loss, fever, chills, weakness or fatigue.  HEENT: Eyes: No visual loss, blurred vision, double vision or yellow sclerae.No hearing loss, sneezing, congestion, runny nose or sore throat.  SKIN: No rash or itching.  CARDIOVASCULAR: per hpi RESPIRATORY: No shortness of breath, cough or sputum.  GASTROINTESTINAL: No anorexia, nausea, vomiting or diarrhea. No abdominal pain or blood.  GENITOURINARY: No burning on urination, no polyuria NEUROLOGICAL: No headache, dizziness, syncope, paralysis, ataxia, numbness or tingling in the extremities. No change in bowel or bladder control.  MUSCULOSKELETAL: No muscle, back pain, joint pain or stiffness.  LYMPHATICS: No enlarged nodes. No history of splenectomy.  PSYCHIATRIC: No history of depression or anxiety.  ENDOCRINOLOGIC: No reports of sweating, cold or heat intolerance. No polyuria or polydipsia.  Marland Kitchen   Physical Examination Today's Vitals   09/16/20 1520  BP: (!) 150/96  Pulse: 95  SpO2: 98%  Weight: 168 lb (76.2 kg)  Height: 6' (1.829 m)   Body mass index is 22.78 kg/m.  Gen: resting comfortably, no acute distress HEENT: no scleral icterus, pupils equal round and reactive, no palptable cervical adenopathy,  CV: RRR, no m/r/g no jvd Resp: Clear to auscultation bilaterally GI: abdomen is soft, non-tender, non-distended, normal bowel sounds, no hepatosplenomegaly MSK: extremities are warm, no edema.  Skin: warm, no rash Neuro:  no focal deficits Psych: appropriate affect   Diagnostic Studies 08/2019 nuclear stress There was no ST segment deviation noted during stress. Findings consistent with large prior inferior/inferoseptal myocardial infarction. This is a high risk study. Risk is based on decreased LVEF, there is prior infarct without myocardium  currently at jeopardy. Consider correlating LVEF with echo to better assess risk. The left ventricular ejection fraction is moderately decreased (30%).      Assessment and Plan  SVT - new diagnosis during recent ER admission, converted with cardioversion - had not been taking coreg at home due to reported symptoms. Will start toprol 50mg  daily as atlernative, perhaps better tolerated - if refractory episodes would need to consider EP evaluation for DCCV  2. Chronic systolic HF - mild LV systolic dysfunction by echo - EKG, echo, nuclear stress  would suggest potential prior inferior infarct - replace coreg with toprol for symptmos  3. Probable CAD - based on imaging - today we focused on his SVT, but next visit would start aspirin and statin in setting of probable CAD  F/u 1 month    , M.D.

## 2020-09-16 NOTE — Addendum Note (Signed)
Addended by: Ermalinda Memos on: 09/16/2020 09:59 PM   Modules accepted: Orders

## 2020-09-16 NOTE — Telephone Encounter (Signed)
Rx sent 

## 2020-09-16 NOTE — Telephone Encounter (Signed)
Pt is aware of his OV in Sept and is asking if we can call in a refill of his Carafate 1mg  to [facility] to hold him until his appointment date. [phone]

## 2020-09-16 NOTE — Patient Instructions (Addendum)
Medication Instructions:  Begin Toprol XL to 50mg  daily.  Stop Coreg (Carvedilol). Continue all other medications.    Labwork: none  Testing/Procedures: none  Follow-Up: 1 month   Any Other Special Instructions Will Be Listed Below (If Applicable).  If you need a refill on your cardiac medications before your next appointment, please call your pharmacy.

## 2020-09-17 NOTE — Telephone Encounter (Signed)
Pt was made aware of rx being sent in.

## 2020-09-17 NOTE — Telephone Encounter (Signed)
Patient seen if office by Dr. Wyline Mood yesterday.

## 2020-10-09 ENCOUNTER — Other Ambulatory Visit: Payer: Self-pay | Admitting: Cardiology

## 2020-10-17 NOTE — Progress Notes (Signed)
Cardiology Office Note  Date: 10/18/2020   ID: Anthony Vaughn, DOB 30-Oct-1970, MRN [id number]  PCP:  Anthony Reining, MD  Cardiologist:  Anthony Rich, MD Electrophysiologist:  None   Chief Complaint: 1 month follow-up  History of Present Illness: Anthony Vaughn is a 50 y.o. male with a history of SVT, HTN, GERD, chronic systolic heart failure, presumed CAD, diverticulitis.  He was last seen by Dr. Wyline Vaughn on 09/16/2020 for recent palpitations with ER visit the prior day.  Palpitations were associated with nausea or severe shortness of breath.  He was given adenosine without improvement.  EKG noted SVT irregular at 200.  He became hypotensive and required cardioversion.  After cardioversion discussion for transfer to [facility] campus but patient signed out AMA.  He had mixed compliance with Coreg.  He was only taking Coreg 1-2 times per week.  He reported it caused fatigue and shortness of breath.  Chest x-ray showed no acute process.  Troponins were 131-279.  TSH 1.18, magnesium 1.6, free T4 0.79, potassium 3.7, creatinine 1.39.  Previous history of severe hypertension with admission in June 2021.  He has been compliant with antihypertensive medication since discharge.  Home SBP's 140s.  History of chronic systolic CHF, echo during recent admission EF 40 to 45%, inferior wall akinetic, basal inferolateral and apical inferior hypokinetic.  No recent chest pains or exertional symptoms.    He was switched From Coreg to Toprol XL 50 mg po daily. If refractory episodes plan would be to consider EP evaluation for DCCV. Mild systolic dysfunction per echo. EKG, Echo, nuclear stress would suggest prior inferior infarct.    He is here for follow-up today states he is doing much better on Toprol-XL.  Denies any more issues with palpitations or arrhythmias.  He states he is breathing much better since she started the new medication.  He continues to smoke and states it is hard to quit.  We  discussed starting aspirin and statin due to evidence of heart disease.  He agrees.  He denies any anginal or exertional symptoms, SOB, DOE.  No orthostatic symptoms, PND, orthopnea, CVA or TIA-like symptoms.  No bleeding issues.  No claudication-like symptoms, DVT or PE-like symptoms, or lower extremity edema.  Past Medical History:  Diagnosis Date   Arthritis    Diverticulitis    GERD (gastroesophageal reflux disease)    HTN (hypertension)     Past Surgical History:  Procedure Laterality Date   BIOPSY  03/04/2019   Procedure: BIOPSY;  Surgeon: Anthony Ade, MD;  [facility];  Service: Endoscopy;;  gastric   COLONOSCOPY     COLONOSCOPY WITH PROPOFOL N/A 03/04/2019   Procedure: COLONOSCOPY WITH PROPOFOL;  Surgeon: Anthony Ade, MD;  [facility];  Service: Endoscopy;  Laterality: N/A;  7:30am   ESOPHAGOGASTRODUODENOSCOPY (EGD) WITH PROPOFOL N/A 03/04/2019   Procedure: ESOPHAGOGASTRODUODENOSCOPY (EGD) WITH PROPOFOL;  Surgeon: Anthony Ade, MD;  [facility];  Service: Endoscopy;  Laterality: N/A;   LAPAROSCOPIC PARTIAL COLECTOMY N/A 07/14/2019   Procedure: LAPAROSCOPIC  LEFT HEMICOLECTOMY;  Surgeon: Anthony Roers, MD;  Location: [facility];  Service: General;  Laterality: N/A;    Current Outpatient Medications  Medication Sig Dispense Refill   amLODipine (NORVASC) 10 MG tablet Take 1 tablet (10 mg total) by mouth daily. For Blood Pressure 30 tablet 5   lidocaine (XYLOCAINE) 2 % solution USE AS DIRECTED; 15 MLS in THE MOUTH OR throat EVERY 6 HOURS AS NEEDED FOR  UP TO FOUR TIMES DAILY 300 mL 1   lisinopril (ZESTRIL) 40 MG tablet TAKE 1 TABLET BY MOUTH EVERY DAY 30 tablet 6   metoprolol succinate (TOPROL-XL) 50 MG 24 hr tablet Take 1 tablet (50 mg total) by mouth daily. 30 tablet 6   sucralfate (CARAFATE) 1 g tablet Take 1 tablet (1 g total) by mouth 4 (four) times daily as needed. With meals and at bedtime as needed 120 tablet 3   amoxicillin (AMOXIL)  875 MG tablet Dental surgery (Patient not taking: No sig reported)     ibuprofen (ADVIL) 600 MG tablet Take 600 mg by mouth every 6 (six) hours as needed. Dental surgery done friday (Patient not taking: No sig reported)     No current facility-administered medications for this visit.   Allergies:  Other   Social History: The patient  reports that he has been smoking cigarettes. He has a 12.50 pack-year smoking history. He has never used smokeless tobacco. He reports that he does not drink alcohol and does not use drugs.   Family History: The patient's family history includes Lung cancer in his father.   ROS:  Please see the history of present illness. Otherwise, complete review of systems is positive for none.  All other systems are reviewed and negative.   Physical Exam: VS:  BP 108/70   Pulse 76   Ht 6' (1.829 m)   Wt 164 lb (74.4 kg)   SpO2 99%   BMI 22.24 kg/m , BMI Body mass index is 22.24 kg/m.  Wt Readings from Last 3 Encounters:  10/18/20 164 lb (74.4 kg)  09/16/20 168 lb (76.2 kg)  04/20/20 167 lb 9.6 oz (76 kg)    General: Patient appears comfortable at rest. Neck: Supple, no elevated JVP or carotid bruits, no thyromegaly. Lungs: Clear to auscultation, nonlabored breathing at rest. Cardiac: Regular rate and rhythm, no S3 or significant systolic murmur, no pericardial rub. Extremities: No pitting edema, distal pulses 2+. Skin: Warm and dry. Musculoskeletal: No kyphosis. Neuropsychiatric: Alert and oriented x3, affect grossly appropriate.  ECG:    Recent Labwork: No results found for requested labs within last 8760 hours.     Component Value Date/Time   CHOL 107 07/16/2019 0552   TRIG 95 07/16/2019 0552   HDL 34 (L) 07/16/2019 0552   CHOLHDL 3.1 07/16/2019 0552   VLDL 19 07/16/2019 0552   LDLCALC 54 07/16/2019 0552    Other Studies Reviewed Today:  Diagnostic Studies 08/2019 nuclear stress There was no ST segment deviation noted during stress. Findings  consistent with large prior inferior/inferoseptal myocardial infarction. This is a high risk study. Risk is based on decreased LVEF, there is prior infarct without myocardium currently at jeopardy. Consider correlating LVEF with echo to better assess risk. The left ventricular ejection fraction is moderately decreased (30%).  Echocardiogram 07/15/2019 1. Left ventricular ejection fraction, by estimation, is 40 to 45%. The left ventricle has mildly decreased function. The left ventricle demonstrates regional wall motion abnormalities (see scoring diagram/findings for description). There is mild left ventricular hypertrophy. Left ventricular diastolic parameters are consistent with Grade II diastolic dysfunction (pseudonormalization). 2. Right ventricular systolic function is normal. The right ventricular size is normal. There is normal pulmonary artery systolic pressure. The estimated right ventricular systolic pressure is 22.8 mmHg. 3. Left atrial size was mildly dilated. 4. The mitral valve is grossly normal. Mild mitral valve regurgitation. 5. The aortic valve is tricuspid. Aortic valve regurgitation is not visualized. 6. Aortic root is  mildly ectatic.   Assessment and Plan:  1. SVT (supraventricular tachycardia) (HCC)   2. Chronic systolic heart failure (HCC)   3. CAD in native artery    1. SVT (supraventricular tachycardia) (HCC) States he is doing much better since starting Toprol-XL.  He denies any further palpitations or rapid heart rates.  States he is actually breathing much better since starting metoprolol also.  Continue Toprol-XL 50 mg daily.  Continue amlodipine 10 mg daily.  2. Chronic systolic heart failure (HCC) Denies any DOE or SOB.  No weight gain.  No lower extremity edema.  Continue lisinopril 40 mg daily.  Continue Toprol-XL 50 mg daily.  3. CAD in native artery ( presumed ) Denies any anginal or exertional symptoms.  Start aspirin 80 mg daily.  Atorvastatin 10 mg  daily.  Get FLP and LFTs in 6 to 8 weeks.  Medication Adjustments/Labs and Tests Ordered: Current medicines are reviewed at length with the patient today.  Concerns regarding medicines are outlined above.   Disposition: Follow-up with Dr. Wyline Vaughn 6 months.  Signed, Rennis Harding, NP 10/18/2020 9:08 AM    [facility] at [facility] [address] [place], [place], [place] [address] Phone: [phone]; Fax: [phone]

## 2020-10-18 ENCOUNTER — Encounter: Payer: Self-pay | Admitting: Family Medicine

## 2020-10-18 ENCOUNTER — Telehealth: Payer: Self-pay | Admitting: Gastroenterology

## 2020-10-18 ENCOUNTER — Telehealth: Payer: Self-pay | Admitting: Internal Medicine

## 2020-10-18 ENCOUNTER — Ambulatory Visit (INDEPENDENT_AMBULATORY_CARE_PROVIDER_SITE_OTHER): Payer: Medicaid Other | Admitting: Family Medicine

## 2020-10-18 VITALS — BP 108/70 | HR 76 | Ht 72.0 in | Wt 164.0 lb

## 2020-10-18 DIAGNOSIS — I5022 Chronic systolic (congestive) heart failure: Secondary | ICD-10-CM

## 2020-10-18 DIAGNOSIS — I2510 Atherosclerotic heart disease of native coronary artery without angina pectoris: Secondary | ICD-10-CM

## 2020-10-18 DIAGNOSIS — I471 Supraventricular tachycardia: Secondary | ICD-10-CM | POA: Diagnosis not present

## 2020-10-18 MED ORDER — SUCRALFATE 1 G PO TABS: 1.0000 g | ORAL_TABLET | Freq: Four times a day (QID) | ORAL | 3 refills | Status: DC | PRN

## 2020-10-18 MED ORDER — ASPIRIN EC 81 MG PO TBEC: 81.0000 mg | DELAYED_RELEASE_TABLET | Freq: Every day | ORAL | 3 refills | Status: AC

## 2020-10-18 MED ORDER — ATORVASTATIN CALCIUM 10 MG PO TABS: 10.0000 mg | ORAL_TABLET | Freq: Every day | ORAL | 3 refills | Status: DC

## 2020-10-18 NOTE — Telephone Encounter (Signed)
Pt said he called earlier and wanted to let us know to send his prescription to [facility] in [place]. [phone]

## 2020-10-18 NOTE — Telephone Encounter (Signed)
Completed.

## 2020-10-18 NOTE — Patient Instructions (Signed)
Medication Instructions:  Your physician has recommended you make the following change in your medication:  START Aspirin 81 mg tablets daily START Atorvastatin (Lipitor) 10 mg tablets daily  *If you need a refill on your cardiac medications before your next appointment, please call your pharmacy*   Lab Work: 6-8 WEEKS: FLP LFT If you have labs (blood work) drawn today and your tests are completely normal, you will receive your results only by: MyChart Message (if you have MyChart) OR A paper copy in the mail If you have any lab test that is abnormal or we need to change your treatment, we will call you to review the results.   Testing/Procedures: None   Follow-Up: At [facility], you and your health needs are our priority.  As part of our continuing mission to provide you with exceptional heart care, we have created designated Provider Care Teams.  These Care Teams include your primary Cardiologist (physician) and Advanced Practice Providers (APPs -  Physician Assistants and Nurse Practitioners) who all work together to provide you with the care you need, when you need it.  We recommend signing up for the patient portal called "MyChart".  Sign up information is provided on this After Visit Summary.  MyChart is used to connect with patients for Virtual Visits (Telemedicine).  Patients are able to view lab/test results, encounter notes, upcoming appointments, etc.  Non-urgent messages can be sent to your provider as well.   To learn more about what you can do with MyChart, go to [email or web address].    Your next appointment:   6 month(s)  The format for your next appointment:   In Person  Provider:   A. Vincenza Hews, NP    Other Instructions

## 2020-10-18 NOTE — Telephone Encounter (Signed)
Medication refill request was sent to [facility] pool with [facility] [place] listed.

## 2020-10-18 NOTE — Telephone Encounter (Signed)
FYI

## 2020-10-18 NOTE — Addendum Note (Signed)
Addended by: Tiffany Kocher on: 10/18/2020 04:42 PM   Modules accepted: Orders

## 2020-10-18 NOTE — Telephone Encounter (Signed)
Pt is needing sucralfate 1 mg sent to [facility]. Pt states that [facility] drug does not have them in stock.

## 2020-10-20 ENCOUNTER — Encounter: Payer: Self-pay | Admitting: Gastroenterology

## 2020-10-20 NOTE — Progress Notes (Signed)
Referring Provider: Waldon Reining, MD Primary Care Physician:  Waldon Reining, MD Primary GI Physician: Dr. Jena Gauss  Chief Complaint  Patient presents with   hx diverticulitis    Reports at times he has to take his carafate more than 4 times a day and he runs out of Rx early and will have abd pain and nausea, Lidocaine doesn't help that much    HPI:   Anthony Vaughn is a 50 y.o. male presenting today for follow-up. He has history of GERD, epigastric pain in the setting of NSAIDs improved with PPI, recurrent diverticulitis, stenotic, stiff, chronically inflamed sigmoid segment on colonoscopy in January 2021 s/p partial colectomy in June 2021 with Dr. Henreitta Leber.  Last seen in our office 04/20/2020.  Reported hematochezia about once a month, most recently after ibuprofen for mouth surgery.  Abdominal pain improved.  Intermittent constipation with some hard stools and straining using Senokot or Dulcolax.  Some nausea without GERD symptoms.  No vomiting.  And intermittent KCl IV secondary to constipation.  Advised to start Colace 100 mg daily, MiraLAX 1-2 times daily as needed, monitor for worsening symptoms, follow-up in 6 months.  Today: Reports he is taking Carafate 4 times a day, but sometimes needs it more frequently.  When he runs out of his medication, he develops upper abdominal pain that radiates everywhere.  Associated nausea without vomiting.  Notes spicy foods trigger symptoms, but he tries to avoid this.  Denies typical reflux symptoms.  He is not on a PPI.  While taking Carafate, generally he does not have any GI symptoms.  No goody powders or BC powders. Ibuprofen daily, 800 mg per day.   Rare toilet tissue hematochezia.  Chronic.  Once a month or less. No melena. Bowels moving well. Uses MiraLAX daily.   Past Medical History:  Diagnosis Date   Arthritis    Diverticulitis    s/p partial colectomy due to stenotic sigmoind colon   GERD (gastroesophageal reflux disease)     HTN (hypertension)     Past Surgical History:  Procedure Laterality Date   BIOPSY  03/04/2019   Procedure: BIOPSY;  Surgeon: Corbin Ade, MD;  [facility];  Service: Endoscopy;;  gastric   COLONOSCOPY     COLONOSCOPY WITH PROPOFOL N/A 03/04/2019   Surgeon: Corbin Ade, MD;  Diverticulosis in the entire colon with a stenotic, stiff, chronically inflamed sigmoid segment.  Low threshold for surgical consultation.  Repeat in 10 years.   ESOPHAGOGASTRODUODENOSCOPY (EGD) WITH PROPOFOL N/A 03/04/2019   Surgeon: Corbin Ade, MD;  gastric mucosal changes found to be chronic gastritis and focal intestinal metaplasia negative for H. 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   LAPAROSCOPIC PARTIAL COLECTOMY N/A 07/14/2019   Procedure: LAPAROSCOPIC  LEFT HEMICOLECTOMY;  Surgeon: Lucretia Roers, MD;  Location: [facility];  Service: General;  Laterality: N/A;    Current Outpatient Medications  Medication Sig Dispense Refill   amLODipine (NORVASC) 10 MG tablet Take 1 tablet (10 mg total) by mouth daily. For Blood Pressure 30 tablet 5   aspirin EC 81 MG tablet Take 1 tablet (81 mg total) by mouth daily. Swallow whole. 90 tablet 3   atorvastatin (LIPITOR) 10 MG tablet Take 1 tablet (10 mg total) by mouth daily. 90 tablet 3   docusate sodium (COLACE) 100 MG capsule Take 100 mg by mouth daily as needed for mild constipation.     esomeprazole (NEXIUM) 40 MG  capsule Take 1 capsule (40 mg total) by mouth daily before breakfast. 30 capsule 3   lidocaine (XYLOCAINE) 2 % solution USE AS DIRECTED; 15 MLS in THE MOUTH OR throat EVERY 6 HOURS AS NEEDED FOR UP TO FOUR TIMES DAILY 300 mL 1   lisinopril (ZESTRIL) 40 MG tablet TAKE 1 TABLET BY MOUTH EVERY DAY 30 tablet 6   metoprolol succinate (TOPROL-XL) 50 MG 24 hr tablet Take 1 tablet (50 mg total) by mouth daily. 30 tablet 6   ondansetron (ZOFRAN) 4 MG tablet Take 4 mg by mouth every 8  (eight) hours as needed for nausea or vomiting.     polyethylene glycol (MIRALAX / GLYCOLAX) 17 g packet Take 17 g by mouth daily.     sucralfate (CARAFATE) 1 g tablet Take 1 tablet (1 g total) by mouth 4 (four) times daily as needed. With meals and at bedtime as needed 120 tablet 3   No current facility-administered medications for this visit.    Allergies as of 10/21/2020 - Review Complete 10/18/2020  Allergen Reaction Noted   Other  02/04/2019    Family History  Problem Relation Age of Onset   Lung cancer Father    Colon cancer Neg Hx    Colon polyps Neg Hx     Social History   Socioeconomic History   Marital status: Single    Spouse name: Not on file   Number of children: Not on file   Years of education: Not on file   Highest education level: Not on file  Occupational History   Not on file  Tobacco Use   Smoking status: Every Day    Packs/day: 0.50    Years: 25.00    Pack years: 12.50    Types: Cigarettes   Smokeless tobacco: Never  Vaping Use   Vaping Use: Never used  Substance and Sexual Activity   Alcohol use: No   Drug use: No   Sexual activity: Yes  Other Topics Concern   Not on file  Social History Narrative   Not on file   Social Determinants of Health   Financial Resource Strain: Not on file  Food Insecurity: Not on file  Transportation Needs: Not on file  Physical Activity: Not on file  Stress: Not on file  Social Connections: Not on file    Review of Systems: Gen: Denies fever, chills, cold or flulike symptoms, presyncope, syncope. CV: Denies chest pain, palpitations. Resp: Denies dyspnea or cough. GI: See HPI Heme: See HPI  Physical Exam: BP 132/86   Pulse 64   Temp (!) 97.1 F (36.2 C)   Ht 6' (1.829 m)   Wt 170 lb (77.1 kg)   BMI 23.06 kg/m  General:   Alert and oriented. No distress noted. Pleasant and cooperative.  Head:  Normocephalic and atraumatic. Eyes:  Conjuctiva clear without scleral icterus. Heart:  S1, S2 present  without murmurs appreciated. Lungs:  Clear to auscultation bilaterally. No wheezes, rales, or rhonchi. No distress.  Abdomen:  +BS, soft, non-tender and non-distended. No rebound or guarding. No HSM or masses noted. Msk:  Symmetrical without gross deformities. Normal posture. Extremities:  Without edema. Neurologic:  Alert and  oriented x4 Psych:  Normal mood and affect.    Assessment: 50 year old male with history of GERD, epigastric pain in the setting of NSAIDs previously improved with PPI, recurrent diverticulitis, stenotic, stiff, chronic inflamed sigmoid segment on colonoscopy in January 2021 s/p partial colectomy June 2021 with Dr. Henreitta Leber presenting today for  follow-up with chief complaint of upper abdominal pain with nausea.   Upper abdominal pain: Chronic.  Previously improved on PPI.  Currently off PPI and taking Carafate 4 times daily, but sometimes more often.  With Carafate, symptoms are fairly well controlled.  If he runs out of Carafate, he develops upper abdominal pain that radiates "everywhere" with associated nausea without vomiting.  Admits to 800 mg ibuprofen daily. Denies melena. EGD on file January 2021 due to similar symptoms with mucosal changes in the stomach suspected to be secondary to NSAIDs (biopsies with chronic gastritis and focal intestinal metaplasia, negative for dysplasia or H. 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  CT A/P with contrast April 2021 with normal-appearing gallbladder and biliary tree.  His abdominal exam is benign today.  Suspect NSAID induced gastritis. Can't rule out gastric ulcer. Less likely biliary etiology, but will need to keep on the differential.  We will start him on PPI daily, continue Carafate, and have patient call with a progress report in 4 weeks.  Hematochezia: Chronic intermittent scant toilet tissue hematochezia occurring once a month or less.  Colonoscopy up-to-date January 2021.  Continue to  monitor.   Plan: 1.  Start Nexium 40 mg daily 30 minutes before breakfast. 2.  Continue Carafate 4 times daily as needed. 3.  Strict NSAID avoidance. 4.  Use Tylenol as needed.  No more than 3000 mg/day. 5.  Requested progress report in 4 weeks.  If persistent symptoms, increase Nexium to 40 mg twice daily and consider EGD. 6.  Monitor for worsening hematochezia.  7.  Follow-up 3-4 months.     Ermalinda Memos, PA-C [facility] 10/21/2020

## 2020-10-21 ENCOUNTER — Other Ambulatory Visit: Payer: Self-pay

## 2020-10-21 ENCOUNTER — Ambulatory Visit (INDEPENDENT_AMBULATORY_CARE_PROVIDER_SITE_OTHER): Payer: Medicaid Other | Admitting: Gastroenterology

## 2020-10-21 ENCOUNTER — Encounter: Payer: Self-pay | Admitting: Gastroenterology

## 2020-10-21 ENCOUNTER — Ambulatory Visit: Payer: Medicaid Other | Admitting: Nurse Practitioner

## 2020-10-21 VITALS — BP 132/86 | HR 64 | Temp 97.1°F | Ht 72.0 in | Wt 170.0 lb

## 2020-10-21 DIAGNOSIS — K921 Melena: Secondary | ICD-10-CM | POA: Diagnosis not present

## 2020-10-21 DIAGNOSIS — R1010 Upper abdominal pain, unspecified: Secondary | ICD-10-CM

## 2020-10-21 MED ORDER — ESOMEPRAZOLE MAGNESIUM 40 MG PO CPDR: 40.0000 mg | DELAYED_RELEASE_CAPSULE | Freq: Every day | ORAL | 3 refills | Status: DC

## 2020-10-21 NOTE — Patient Instructions (Signed)
I suspect your abdominal pain is likely secondary to gastritis (inflammation in your stomach) and possibly underlying reflux.  Start Nexium 40 mg once daily 30 minutes before breakfast.  I have sent a prescription to your pharmacy.  You may continue to use Carafate up to 4 times daily as needed.  As we discussed, ibuprofen can cause inflammation and ulceration of the GI tract. I recommend discontinuation of all NSAID products including ibuprofen, Aleve, Advil, naproxen, Goody powders, BC powders, and anything that says "NSAID" on the package.  Use Tylenol as needed for pain.  No more than 3000 mg of Tylenol per day.  Please call with a progress report in 4 weeks.  We will plan to see you in the office in 3-4 months.  Do not hesitate to call if you have questions or concerns prior to your next visit.   It was great to see you again today!   Ermalinda Memos, PA-C [facility]

## 2020-10-24 IMAGING — CT CT ABD-PELV W/ CM
2 of 5 series · 15 of 46 positions shown, 17 images · IV contrast (Omnipaque or Isovue)
Comparison: 02/23/2019 from [HOSPITAL] [facility]

CLINICAL DATA: Follow-up diverticulitis. Sigmoid colon stricture on
recent colonoscopy.

EXAM:
CT ABDOMEN AND PELVIS WITH CONTRAST
TECHNIQUE: Multidetector CT imaging of the abdomen and pelvis was performed
using the standard protocol following bolus administration of
intravenous contrast. Rectal contrast was also administered.
CONTRAST:  100mL OMNIPAQUE IOHEXOL 300 MG/ML  SOLN

[Series 2: axial st · axial · 0.64mm/px · z∈[+971,+1381]mm · 12 of 92 slices shown, 14 images]
[im 5/92  soft-tissue]
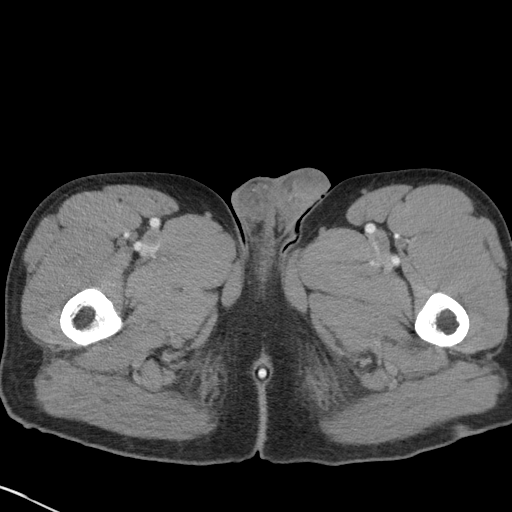
[im 5/92  bone]
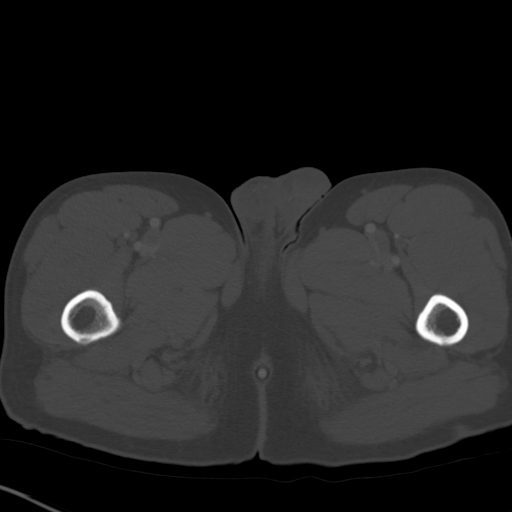
[im 15/92  soft-tissue]
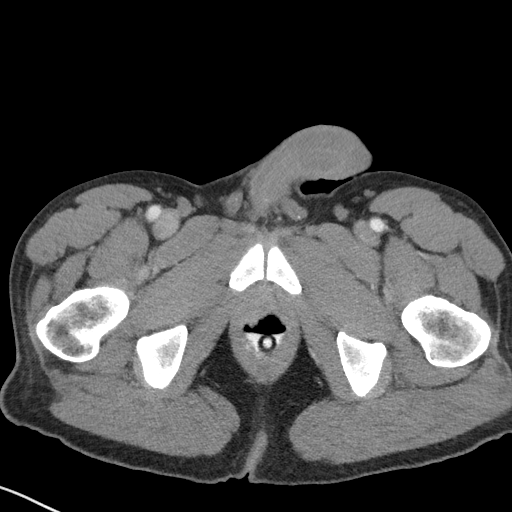
[im 20/92  soft-tissue]
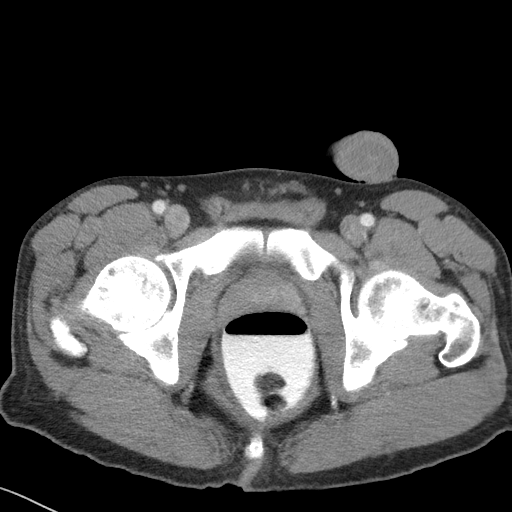
[im 29/92  soft-tissue]
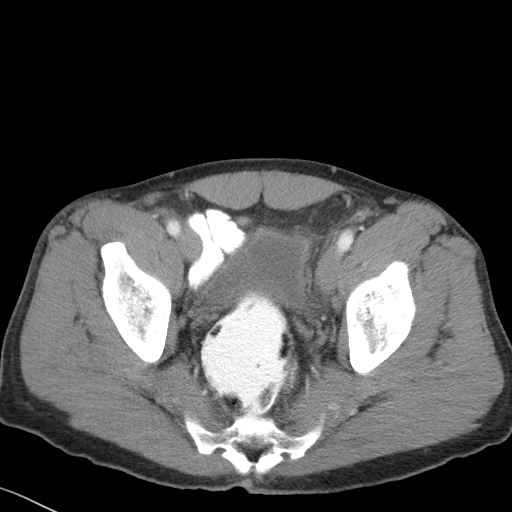
[im 34/92  soft-tissue]
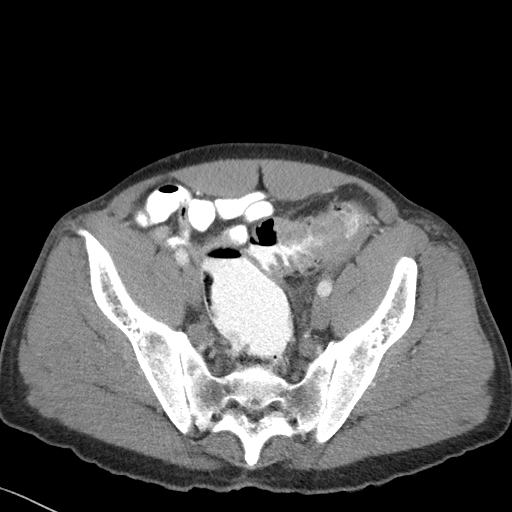
[im 44/92  soft-tissue]
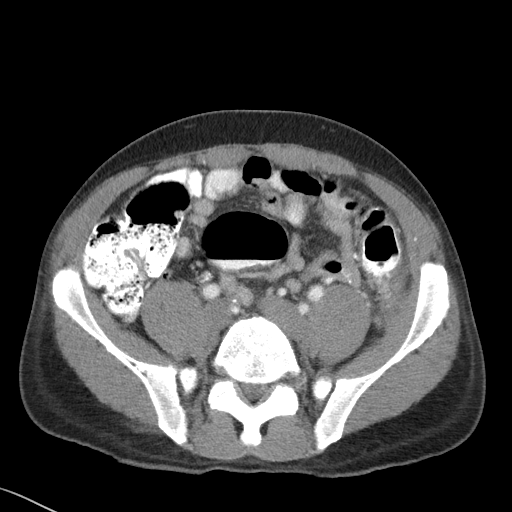
[im 48/92  soft-tissue]
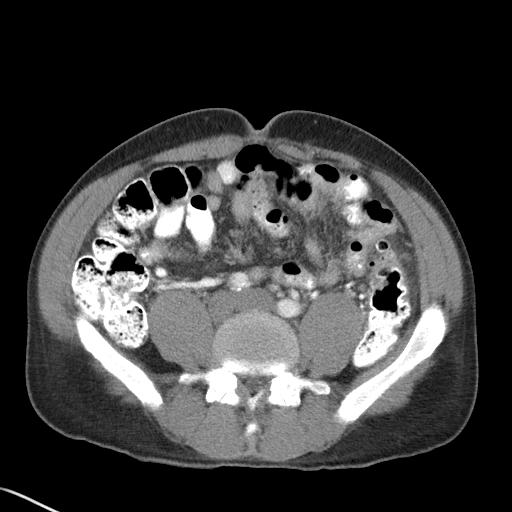
[im 58/92  soft-tissue]
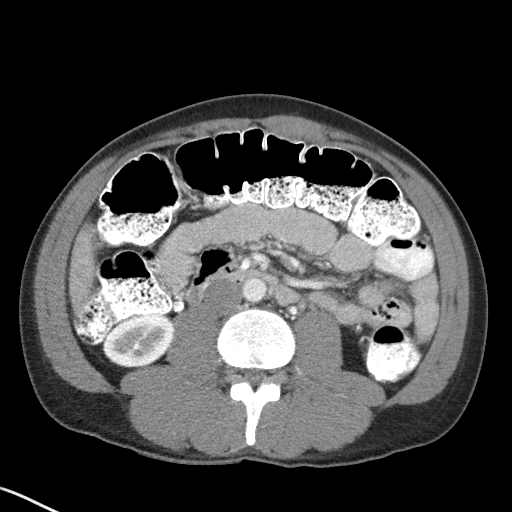
[im 63/92  soft-tissue]
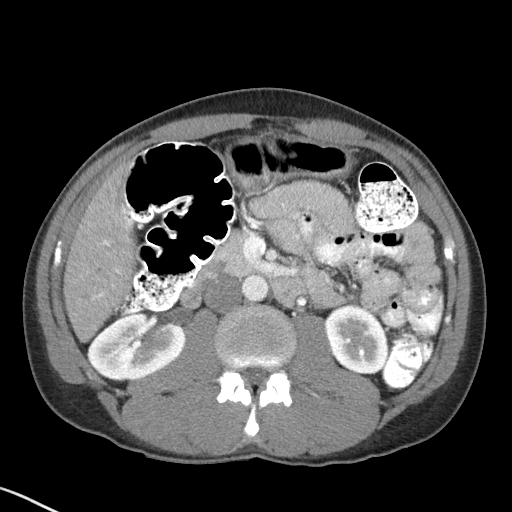
[im 63/92  bone]
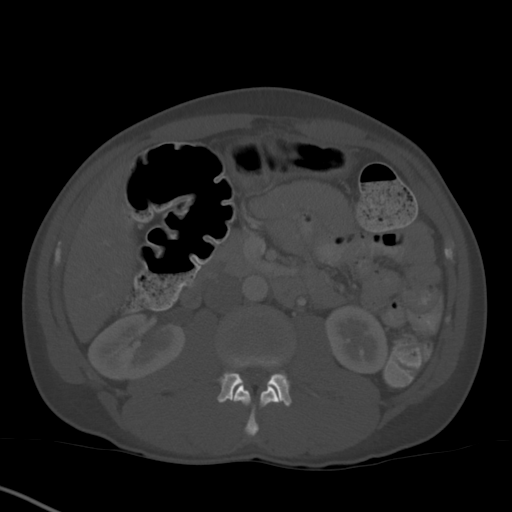
[im 72/92  soft-tissue]
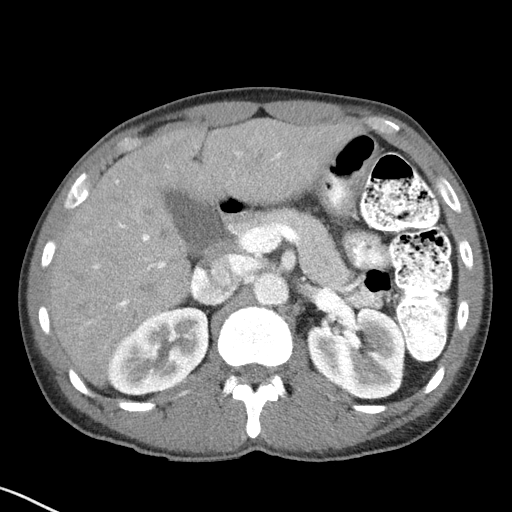
[im 77/92  soft-tissue]
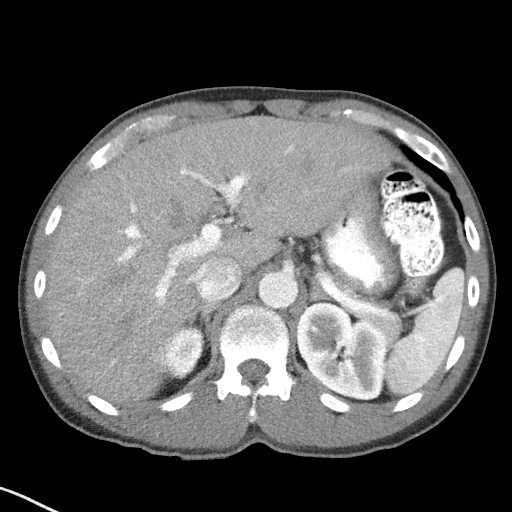
[im 87/92  soft-tissue]
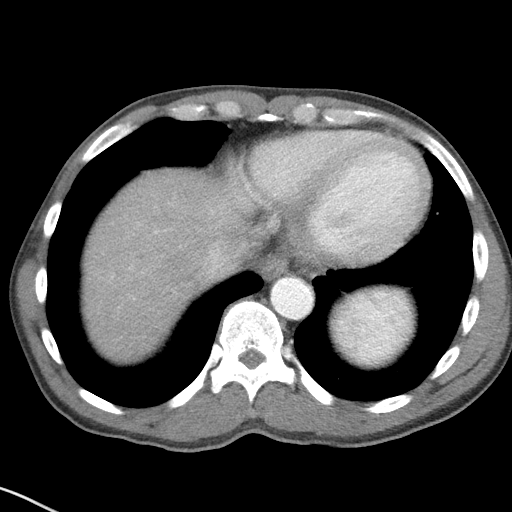

[Series 6: coronal st · coronal · 0.66mm/px · 3 of 102 slices shown]
[im 34/102  soft-tissue]
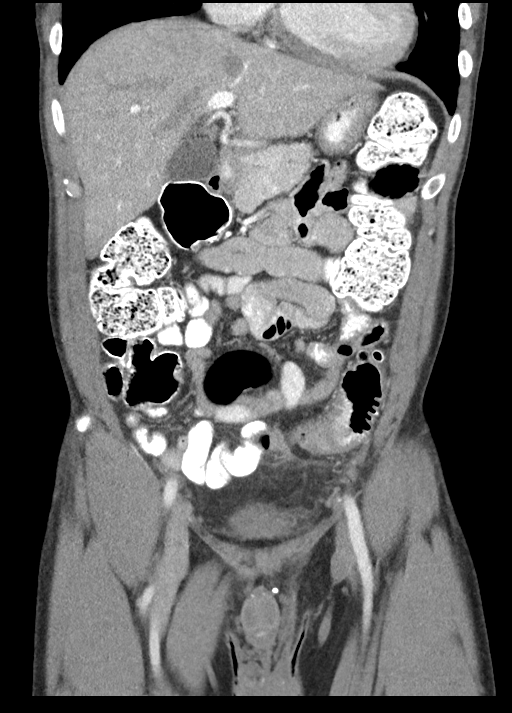
[im 45/102  soft-tissue]
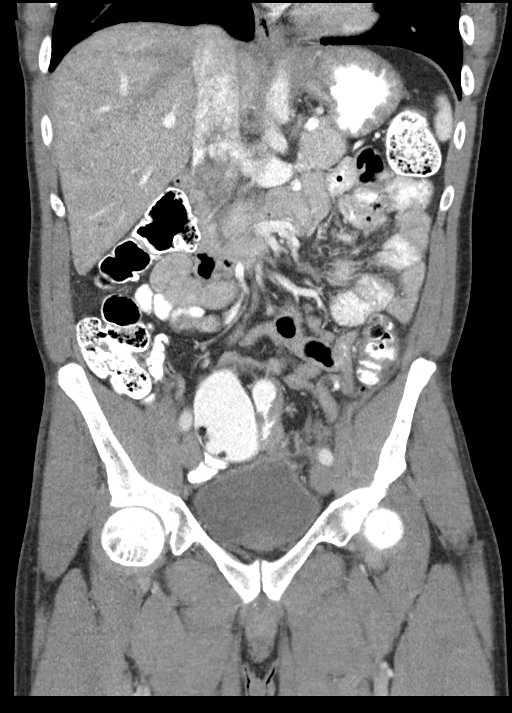
[im 57/102  soft-tissue]
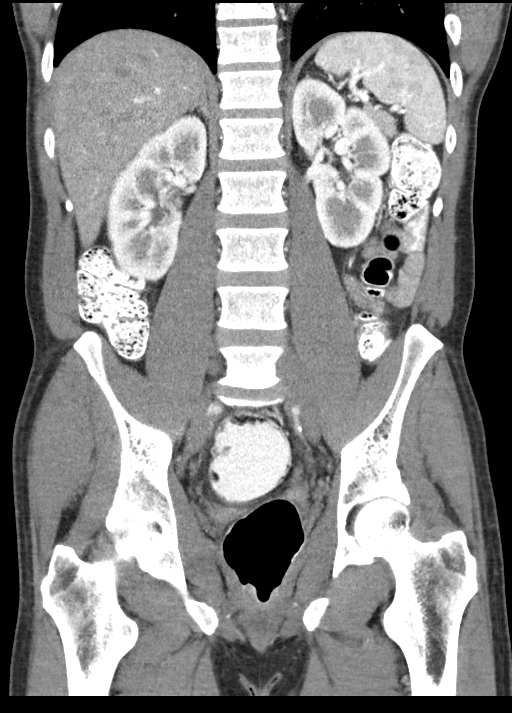

[15 of 46 positions shown; findings below may reference images not displayed]

FINDINGS: Lower Chest: No acute findings.

Hepatobiliary: No hepatic masses identified. Stable tiny sub-cm cyst
in superior left hepatic lobe. Gallbladder is unremarkable. No
evidence of biliary ductal dilatation.

Pancreas:  No mass or inflammatory changes.

Spleen: Within normal limits in size and appearance.

Adrenals/Urinary Tract: No masses identified. No evidence of
ureteral calculi or hydronephrosis.

Stomach/Bowel: Sigmoid colon diverticulosis is again demonstrated.
Rectal contrast shows the presence of short segment wall thickening
and luminal narrowing in the proximal sigmoid colon, in area of
diverticulosis. This appears similar to previous study, favoring
chronic diverticulitis or muscular hypertrophy over carcinoma. No
acute inflammatory process or abnormal fluid collections seen. No
evidence of bowel obstruction.

Vascular/Lymphatic: No pathologically enlarged lymph nodes. No
abdominal aortic aneurysm. Aortic atherosclerosis incidentally
noted.

Reproductive:  No mass or other significant abnormality.

Other:  None.

Musculoskeletal:  No suspicious bone lesions identified.
IMPRESSION: 1. Sigmoid colon diverticulosis, with short-segment wall thickening
and luminal narrowing which appears similar to prior exam. This
favors chronic diverticulitis/stricture or muscular hypertrophy over
colon carcinoma. Recommend correlation with recent colonoscopy
results.
2. No evidence of metastatic disease or other or other signal
abnormality.

Aortic Atherosclerosis ([id number]-[id number].P).

## 2020-11-02 ENCOUNTER — Telehealth: Payer: Self-pay | Admitting: Cardiology

## 2020-11-02 ENCOUNTER — Telehealth: Payer: Self-pay | Admitting: Internal Medicine

## 2020-11-02 NOTE — Telephone Encounter (Signed)
Spoke with patient - states that he has tried taking the Lipitor during the day & at night.  Also, has tried taking it with food.  Still has gripey & uncomfortable feeling in stomach after taking.  Can we change to something different or cut the dose in half ?

## 2020-11-02 NOTE — Telephone Encounter (Signed)
Left message to return call 

## 2020-11-02 NOTE — Telephone Encounter (Signed)
Pt c/o medication issue:  1. Name of Medication atorvastatin (LIPITOR) 10 MG tablet   2. How are you currently taking this medication (dosage and times per day)? 1 tablet in morning   3. Are you having a reaction (difficulty breathing--STAT)? yes  4. What is your medication issue?  Its keeping him up at night and its giving him stomach discomfort- it makes his stomach feel warm and nagging

## 2020-11-02 NOTE — Telephone Encounter (Signed)
No answer, no v/m

## 2020-11-02 NOTE — Telephone Encounter (Signed)
[phone] please call patient about his medications, one is not working

## 2020-11-05 NOTE — Telephone Encounter (Signed)
Voice mail not activated.

## 2020-11-14 ENCOUNTER — Other Ambulatory Visit: Payer: Self-pay | Admitting: Gastroenterology

## 2020-11-14 DIAGNOSIS — K219 Gastro-esophageal reflux disease without esophagitis: Secondary | ICD-10-CM

## 2020-11-16 ENCOUNTER — Other Ambulatory Visit: Payer: Self-pay

## 2020-11-16 ENCOUNTER — Other Ambulatory Visit (HOSPITAL_COMMUNITY)
Admission: RE | Admit: 2020-11-16 | Discharge: 2020-11-16 | Disposition: A | Discharge status: Home / Self Care | Payer: Medicaid Other | Source: Ambulatory Visit | Attending: Family Medicine | Admitting: Family Medicine

## 2020-11-16 DIAGNOSIS — I471 Supraventricular tachycardia: Secondary | ICD-10-CM | POA: Insufficient documentation

## 2020-11-16 DIAGNOSIS — I5022 Chronic systolic (congestive) heart failure: Secondary | ICD-10-CM | POA: Diagnosis present

## 2020-11-16 LAB — HEPATIC FUNCTION PANEL
ALT: 11 U/L (ref 0–44)
AST: 14 U/L — ABNORMAL LOW (ref 15–41)
Albumin: 3.9 g/dL (ref 3.5–5.0)
Alkaline Phosphatase: 54 U/L (ref 38–126)
Bilirubin, Direct: <0.1 mg/dL (ref 0.0–0.2)
Total Bilirubin: 0.3 mg/dL (ref 0.3–1.2)
Total Protein: 6.9 g/dL (ref 6.5–8.1)

## 2020-11-16 LAB — LIPID PANEL
Cholesterol: 188 mg/dL (ref 0–200)
HDL: 62 mg/dL (ref >40)
LDL Cholesterol: 100 mg/dL — ABNORMAL HIGH (ref 0–99)
Total CHOL/HDL Ratio: 3 RATIO
Triglycerides: 131 mg/dL (ref <150)
VLDL: 26 mg/dL (ref 0–40)

## 2020-11-17 ENCOUNTER — Telehealth: Payer: Self-pay | Admitting: *Deleted

## 2020-11-17 MED ORDER — ATORVASTATIN CALCIUM 10 MG PO TABS: 5.0000 mg | ORAL_TABLET | Freq: Every day | ORAL | Status: DC

## 2020-11-17 NOTE — Telephone Encounter (Signed)
Lesle Chris, LPN  08/81/1031  5:01 PM EDT Back to Top    Patient notified.  Stated that he just had called the office & was told to decrease his Lipitor to 5mg  daily as he was doing the 10mg .  Was told to cut back due to symptoms (see recent telephone note).  He also reports that he did his labs after being off of the Lipitor.  Was started on 10/18/2020, but states he stopped not long after that.  Will have him start back with 5mg  daily & repeat FLP, LFT in 6-8 weeks.  He will call back if he not able to tolerate this.  Reminded him to not do labs if has not been on the medication.  He verbalized understanding.

## 2020-11-17 NOTE — Telephone Encounter (Signed)
-----   Message from Netta Neat., NP sent at 11/16/2020  1:30 PM EDT ----- LDL continues elevated at 128.  Due to presumed CAD increase atorvastatin to 20 mg daily.  Netta Neat, NP  11/16/2020 1:30 PM

## 2020-11-17 NOTE — Telephone Encounter (Signed)
Patient notified via detailed voice message.

## 2020-11-30 ENCOUNTER — Other Ambulatory Visit: Payer: Self-pay | Admitting: Gastroenterology

## 2020-11-30 DIAGNOSIS — R1010 Upper abdominal pain, unspecified: Secondary | ICD-10-CM

## 2020-11-30 DIAGNOSIS — K219 Gastro-esophageal reflux disease without esophagitis: Secondary | ICD-10-CM

## 2020-11-30 MED ORDER — LIDOCAINE VISCOUS HCL 2 % MT SOLN: OROMUCOSAL | 0 refills | Status: DC

## 2020-11-30 MED ORDER — OMEPRAZOLE 40 MG PO CPDR: 40.0000 mg | DELAYED_RELEASE_CAPSULE | Freq: Every day | ORAL | 3 refills | Status: DC

## 2020-11-30 NOTE — Telephone Encounter (Signed)
Lmom for pt to return my call.  

## 2020-11-30 NOTE — Telephone Encounter (Signed)
Tried calling patient to discuss further. Unable to reach him. Left message on machine requesting return call.   Please verify patient is taking Nexium 40 mg daily 30 minutes before breakfast and avoiding all NSAIDs.   If he is taking the Nexium every day along with the carafate, recommend we increase Nexium to twice daily, 30 minutes before breakfast and dinner. Continue Carafate up to 3 times daily before meals and at bedtime if needed. I will refill this for him.   Due to persistent upper GI symptoms, I would also offer proceeding with an EGD for further evaluation if he agreeable. Otherwise, we can monitor his response to Nexium twice daily and consider EGD if he doesn't have improvement in about 4 weeks.

## 2020-11-30 NOTE — Telephone Encounter (Signed)
Spoke with patient.  Reports Nexium was worsening his abdominal pain, so he discontinued this about 2 weeks ago.  He does continue with upper abdominal pain daily, but not as severe as when taking Nexium.  Carafate continues to work very well for him, but he ran out and insurance will not pay for refill for 6 more days. Denies NSAID use.   Recommended starting omeprazole 40 mg daily, proceed with EGD, and will send prescription of viscous lidocaine (previously used and helpful) to his pharmacy while awaiting Carafate refill.  [facility] clinical pool: Please arrange EGD with propofol with Dr. Jena Gauss. ASA III Dx: upper abdominal pain, nausea without vomiting.

## 2020-11-30 NOTE — Telephone Encounter (Signed)
Tried to call pt, LMOVM for return call. 

## 2020-11-30 NOTE — Telephone Encounter (Signed)
Pt is now stating that the nexium is causing his stomach to hurt and therefore quit taking it. Pt is agreeable with scheduling EGD. Updated pt's phone number again in system.

## 2020-11-30 NOTE — Telephone Encounter (Signed)
Pt states that the esomeprazole is not working and pt has ran out of sucralfate and it is too soon for him to get a refill. Pt states that Baxter Hire told him that she would call something in similar to the sucralfate to get him to his refill.   [facility].

## 2020-12-02 NOTE — Telephone Encounter (Signed)
Tried to call pt, LMOVM for return call. 

## 2020-12-02 NOTE — Telephone Encounter (Signed)
Letter mailed

## 2020-12-03 ENCOUNTER — Other Ambulatory Visit: Payer: Self-pay

## 2020-12-03 NOTE — Telephone Encounter (Signed)
Pre-op appt 12/15/20 at 1:15pm.   Tried to call pt, LMOAM to inform him of pre-op appt. Appt letter mailed with procedure instructions.

## 2020-12-03 NOTE — Telephone Encounter (Signed)
Pt called office, EGD scheduled for 12/17/20 at 2:45pm. Orders entered.  PA for EGD submitted via [facility] website. PA# [id number], valid 12/17/20-03/17/21.

## 2020-12-13 NOTE — Patient Instructions (Signed)
Anthony Vaughn  12/13/2020     @PREFPERIOPPHARMACY @   Your procedure is scheduled on  12/17/2020.   Report to 13/12/2020 at  1315 (1:15)  P.M.   Call this number if you have problems the morning of surgery:  [phone]   Remember:  Follow the diet instructions given to you by the office.    Take these medicines the morning of surgery with A SIP OF WATER                  amlodipine, metoprolol, prilosec, zofran (if needed).     Do not wear jewelry, make-up or nail polish.  Do not wear lotions, powders, or perfumes, or deodorant.  Do not shave 48 hours prior to surgery.  Men may shave face and neck.  Do not bring valuables to the hospital.  [facility] is not responsible for any belongings or valuables.  Contacts, dentures or bridgework may not be worn into surgery.  Leave your suitcase in the car.  After surgery it may be brought to your room.  For patients admitted to the hospital, discharge time will be determined by your treatment team.  Patients discharged the day of surgery will not be allowed to drive home and must have someone with them for 24 hours.    Special instructions:   DO NOT smoke tobacco or vape for 24 hours before your procedure.  Please read over the following fact sheets that you were given. Anesthesia Post-op Instructions and Care and Recovery After Surgery      Upper Endoscopy, Adult, Care After This sheet gives you information about how to care for yourself after your procedure. Your health care provider may also give you more specific instructions. If you have problems or questions, contact your health care provider. What can I expect after the procedure? After the procedure, it is common to have: A sore throat. Mild stomach pain or discomfort. Bloating. Nausea. Follow these instructions at home:  Follow instructions from your health care provider about what to eat or drink after your procedure. Return to your normal activities  as told by your health care provider. Ask your health care provider what activities are safe for you. Take over-the-counter and prescription medicines only as told by your health care provider. If you were given a sedative during the procedure, it can affect you for several hours. Do not drive or operate machinery until your health care provider says that it is safe. Keep all follow-up visits as told by your health care provider. This is important. Contact a health care provider if you have: A sore throat that lasts longer than one day. Trouble swallowing. Get help right away if: You vomit blood or your vomit looks like coffee grounds. You have: A fever. Bloody, black, or tarry stools. A severe sore throat or you cannot swallow. Difficulty breathing. Severe pain in your chest or abdomen. Summary After the procedure, it is common to have a sore throat, mild stomach discomfort, bloating, and nausea. If you were given a sedative during the procedure, it can affect you for several hours. Do not drive or operate machinery until your health care provider says that it is safe. Follow instructions from your health care provider about what to eat or drink after your procedure. Return to your normal activities as told by your health care provider. This information is not intended to replace advice given to you by your health care provider. Make sure you  discuss any questions you have with your health care provider. Document Revised: 11/29/2018 Document Reviewed: 06/25/2017 Elsevier Patient Education  2022 [employer] Monitored Anesthesia Care, Care After This sheet gives you information about how to care for yourself after your procedure. Your health care provider may also give you more specific instructions. If you have problems or questions, contact your health care provider. What can I expect after the procedure? After the procedure, it is common to have: Tiredness. Forgetfulness about what  happened after the procedure. Impaired judgment for important decisions. Nausea or vomiting. Some difficulty with balance. Follow these instructions at home: For the time period you were told by your health care provider:   Rest as needed. Do not participate in activities where you could fall or become injured. Do not drive or use machinery. Do not drink alcohol. Do not take sleeping pills or medicines that cause drowsiness. Do not make important decisions or sign legal documents. Do not take care of children on your own. Eating and drinking Follow the diet that is recommended by your health care provider. Drink enough fluid to keep your urine pale yellow. If you vomit: Drink water, juice, or soup when you can drink without vomiting. Make sure you have little or no nausea before eating solid foods. General instructions Have a responsible adult stay with you for the time you are told. It is important to have someone help care for you until you are awake and alert. Take over-the-counter and prescription medicines only as told by your health care provider. If you have sleep apnea, surgery and certain medicines can increase your risk for breathing problems. Follow instructions from your health care provider about wearing your sleep device: Anytime you are sleeping, including during daytime naps. While taking prescription pain medicines, sleeping medicines, or medicines that make you drowsy. Avoid smoking. Keep all follow-up visits as told by your health care provider. This is important. Contact a health care provider if: You keep feeling nauseous or you keep vomiting. You feel light-headed. You are still sleepy or having trouble with balance after 24 hours. You develop a rash. You have a fever. You have redness or swelling around the IV site. Get help right away if: You have trouble breathing. You have new-onset confusion at home. Summary For several hours after your procedure, you  may feel tired. You may also be forgetful and have poor judgment. Have a responsible adult stay with you for the time you are told. It is important to have someone help care for you until you are awake and alert. Rest as told. Do not drive or operate machinery. Do not drink alcohol or take sleeping pills. Get help right away if you have trouble breathing, or if you suddenly become confused. This information is not intended to replace advice given to you by your health care provider. Make sure you discuss any questions you have with your health care provider. Document Revised: 10/09/2019 Document Reviewed: 12/26/2018 Elsevier Patient Education  2022 [employer].

## 2020-12-15 ENCOUNTER — Telehealth: Payer: Self-pay | Admitting: Internal Medicine

## 2020-12-15 ENCOUNTER — Encounter (HOSPITAL_COMMUNITY)
Admission: RE | Admit: 2020-12-15 | Discharge: 2020-12-15 | Disposition: A | Discharge status: Home / Self Care | Payer: Medicaid Other | Source: Ambulatory Visit | Attending: Internal Medicine | Admitting: Internal Medicine

## 2020-12-15 NOTE — Telephone Encounter (Signed)
Pt currently scheduled for EGD 12/17/20.  Called pt, he's not feeling well. Thinks he may be coming down with something. EGD rescheduled to 01/27/21 at 9:00am. Endo scheduler informed.

## 2020-12-15 NOTE — Telephone Encounter (Signed)
Pre-op appt 01/24/21. Appt letter mailed with new procedure instructions.   Previous PA: PA# [id number], valid 12/17/20-03/17/21.

## 2020-12-15 NOTE — Telephone Encounter (Signed)
PATIENT CALLED AND SAID HE IS NOT FEELING WELL AND NEEDS TO RESCHEDULE HIS PROCEDURE

## 2021-01-11 ENCOUNTER — Other Ambulatory Visit: Payer: Self-pay | Admitting: *Deleted

## 2021-01-11 ENCOUNTER — Encounter: Payer: Self-pay | Admitting: *Deleted

## 2021-01-11 DIAGNOSIS — I2510 Atherosclerotic heart disease of native coronary artery without angina pectoris: Secondary | ICD-10-CM

## 2021-01-21 NOTE — Patient Instructions (Signed)
Anthony Vaughn  01/21/2021     @PREFPERIOPPHARMACY @   Your procedure is scheduled on  01/27/2021.   Report to 01/29/2021 at  0945 A.M.   Call this number if you have problems the morning of surgery:  [phone]   Remember:  Follow the diet and prep instructions given to you by the office.    Take these medicines the morning of surgery with A SIP OF WATER              amlodipine, metoprolol, prilosec, zofran (if needed).     Do not wear jewelry, make-up or nail polish.  Do not wear lotions, powders, or perfumes, or deodorant.  Do not shave 48 hours prior to surgery.  Men may shave face and neck.  Do not bring valuables to the hospital.  [facility] is not responsible for any belongings or valuables.  Contacts, dentures or bridgework may not be worn into surgery.  Leave your suitcase in the car.  After surgery it may be brought to your room.  For patients admitted to the hospital, discharge time will be determined by your treatment team.  Patients discharged the day of surgery will not be allowed to drive home and must have someone with them for 24 hours.    Special instructions:   DO NOT smoke tobacco or vape for 24 hours before your procedure.  Please read over the following fact sheets that you were given. Anesthesia Post-op Instructions and Care and Recovery After Surgery      Upper Endoscopy, Adult, Care After This sheet gives you information about how to care for yourself after your procedure. Your health care provider may also give you more specific instructions. If you have problems or questions, contact your health care provider. What can I expect after the procedure? After the procedure, it is common to have: A sore throat. Mild stomach pain or discomfort. Bloating. Nausea. Follow these instructions at home:  Follow instructions from your health care provider about what to eat or drink after your procedure. Return to your normal activities  as told by your health care provider. Ask your health care provider what activities are safe for you. Take over-the-counter and prescription medicines only as told by your health care provider. If you were given a sedative during the procedure, it can affect you for several hours. Do not drive or operate machinery until your health care provider says that it is safe. Keep all follow-up visits as told by your health care provider. This is important. Contact a health care provider if you have: A sore throat that lasts longer than one day. Trouble swallowing. Get help right away if: You vomit blood or your vomit looks like coffee grounds. You have: A fever. Bloody, black, or tarry stools. A severe sore throat or you cannot swallow. Difficulty breathing. Severe pain in your chest or abdomen. Summary After the procedure, it is common to have a sore throat, mild stomach discomfort, bloating, and nausea. If you were given a sedative during the procedure, it can affect you for several hours. Do not drive or operate machinery until your health care provider says that it is safe. Follow instructions from your health care provider about what to eat or drink after your procedure. Return to your normal activities as told by your health care provider. This information is not intended to replace advice given to you by your health care provider. Make sure you discuss any questions you  have with your health care provider. Document Revised: 11/29/2018 Document Reviewed: 06/25/2017 Elsevier Patient Education  2022 [employer]. Monitored Anesthesia Care, Care After This sheet gives you information about how to care for yourself after your procedure. Your health care provider may also give you more specific instructions. If you have problems or questions, contact your health care provider. What can I expect after the procedure? After the procedure, it is common to have: Tiredness. Forgetfulness about what  happened after the procedure. Impaired judgment for important decisions. Nausea or vomiting. Some difficulty with balance. Follow these instructions at home: For the time period you were told by your health care provider:   Rest as needed. Do not participate in activities where you could fall or become injured. Do not drive or use machinery. Do not drink alcohol. Do not take sleeping pills or medicines that cause drowsiness. Do not make important decisions or sign legal documents. Do not take care of children on your own. Eating and drinking Follow the diet that is recommended by your health care provider. Drink enough fluid to keep your urine pale yellow. If you vomit: Drink water, juice, or soup when you can drink without vomiting. Make sure you have little or no nausea before eating solid foods. General instructions Have a responsible adult stay with you for the time you are told. It is important to have someone help care for you until you are awake and alert. Take over-the-counter and prescription medicines only as told by your health care provider. If you have sleep apnea, surgery and certain medicines can increase your risk for breathing problems. Follow instructions from your health care provider about wearing your sleep device: Anytime you are sleeping, including during daytime naps. While taking prescription pain medicines, sleeping medicines, or medicines that make you drowsy. Avoid smoking. Keep all follow-up visits as told by your health care provider. This is important. Contact a health care provider if: You keep feeling nauseous or you keep vomiting. You feel light-headed. You are still sleepy or having trouble with balance after 24 hours. You develop a rash. You have a fever. You have redness or swelling around the IV site. Get help right away if: You have trouble breathing. You have new-onset confusion at home. Summary For several hours after your procedure, you  may feel tired. You may also be forgetful and have poor judgment. Have a responsible adult stay with you for the time you are told. It is important to have someone help care for you until you are awake and alert. Rest as told. Do not drive or operate machinery. Do not drink alcohol or take sleeping pills. Get help right away if you have trouble breathing, or if you suddenly become confused. This information is not intended to replace advice given to you by your health care provider. Make sure you discuss any questions you have with your health care provider. Document Revised: 10/09/2019 Document Reviewed: 12/26/2018 Elsevier Patient Education  2022 [employer].

## 2021-01-24 ENCOUNTER — Encounter (HOSPITAL_COMMUNITY): Payer: Self-pay

## 2021-01-24 ENCOUNTER — Other Ambulatory Visit: Payer: Self-pay

## 2021-01-24 ENCOUNTER — Encounter (HOSPITAL_COMMUNITY)
Admission: RE | Admit: 2021-01-24 | Discharge: 2021-01-24 | Disposition: A | Discharge status: Home / Self Care | Payer: Medicaid Other | Source: Ambulatory Visit | Attending: Internal Medicine | Admitting: Internal Medicine

## 2021-01-26 ENCOUNTER — Encounter (HOSPITAL_COMMUNITY): Payer: Self-pay | Admitting: Anesthesiology

## 2021-01-27 ENCOUNTER — Ambulatory Visit (HOSPITAL_COMMUNITY): Admission: RE | Admit: 2021-01-27 | Payer: Medicaid Other | Source: Home / Self Care | Admitting: Internal Medicine

## 2021-01-27 ENCOUNTER — Encounter (HOSPITAL_COMMUNITY): Admission: RE | Payer: Self-pay | Source: Home / Self Care

## 2021-01-27 SURGERY — ESOPHAGOGASTRODUODENOSCOPY (EGD) WITH PROPOFOL
Anesthesia: Monitor Anesthesia Care

## 2021-02-01 ENCOUNTER — Telehealth: Payer: Self-pay | Admitting: Internal Medicine

## 2021-02-01 ENCOUNTER — Other Ambulatory Visit (HOSPITAL_COMMUNITY)
Admission: RE | Admit: 2021-02-01 | Discharge: 2021-02-01 | Disposition: A | Discharge status: Home / Self Care | Payer: Medicaid Other | Source: Ambulatory Visit | Attending: Cardiology | Admitting: Cardiology

## 2021-02-01 ENCOUNTER — Other Ambulatory Visit: Payer: Self-pay | Admitting: Gastroenterology

## 2021-02-01 ENCOUNTER — Other Ambulatory Visit: Payer: Self-pay

## 2021-02-01 DIAGNOSIS — I2510 Atherosclerotic heart disease of native coronary artery without angina pectoris: Secondary | ICD-10-CM | POA: Diagnosis present

## 2021-02-01 DIAGNOSIS — R1010 Upper abdominal pain, unspecified: Secondary | ICD-10-CM

## 2021-02-01 LAB — LIPID PANEL
Cholesterol: 187 mg/dL (ref 0–200)
HDL: 68 mg/dL (ref >40)
LDL Cholesterol: 109 mg/dL — ABNORMAL HIGH (ref 0–99)
Total CHOL/HDL Ratio: 2.8 RATIO
Triglycerides: 50 mg/dL (ref <150)
VLDL: 10 mg/dL (ref 0–40)

## 2021-02-01 LAB — HEPATIC FUNCTION PANEL
ALT: 13 U/L (ref 0–44)
AST: 17 U/L (ref 15–41)
Albumin: 3.9 g/dL (ref 3.5–5.0)
Alkaline Phosphatase: 55 U/L (ref 38–126)
Bilirubin, Direct: 0.1 mg/dL (ref 0.0–0.2)
Indirect Bilirubin: 0.3 mg/dL (ref 0.3–0.9)
Total Bilirubin: 0.4 mg/dL (ref 0.3–1.2)
Total Protein: 7.1 g/dL (ref 6.5–8.1)

## 2021-02-01 NOTE — Telephone Encounter (Signed)
Last OV 10/2020 and he will need to wait for his upcoming OV to get rescheduled

## 2021-02-01 NOTE — Telephone Encounter (Signed)
Patient called and said that he missed his procedure because he was very sick, but he called them and cancelled it.  He wants to know if there is another test Baxter Hire wants him to do or does he need to wait until his appointment in February with Baxter Hire

## 2021-02-02 NOTE — Telephone Encounter (Signed)
Noted  

## 2021-02-02 NOTE — Telephone Encounter (Signed)
Agree with needing to be seen in the office prior to rescheduling as he has missed two EGD appointments and last OV was in September. If he is still having significant symptoms, we can try to get his appointment moved up. Would be ok for him to see any APP or Dr. Jena Gauss.

## 2021-02-18 ENCOUNTER — Other Ambulatory Visit: Payer: Self-pay | Admitting: Gastroenterology

## 2021-03-07 ENCOUNTER — Encounter: Payer: Self-pay | Admitting: Gastroenterology

## 2021-03-07 NOTE — Progress Notes (Deleted)
Referring Provider: Waldon Reining, MD Primary Care Physician:  Waldon Reining, MD Primary GI Physician: Dr. Jena Gauss  No chief complaint on file.   HPI:   Anthony Vaughn is a 51 y.o. male with history of GERD, epigastric pain in the setting of NSAIDs previously improved with PPI, recurrent diverticulitis, s/p partial colectomy in June 2021 due to stenotic, stiff, chronically inflamed sigmoid segment, presenting today for follow-up of upper abdominal pain and hematochezia ***  Last seen in our office 10/21/2020.  He reported upper abdominal pain with associated nausea without vomiting.  He was taking Carafate 4 times a day, but sometimes more frequently which does help with his abdominal pain, but symptoms returned when he runs out of Carafate.  He was not on a PPI and denied any typical reflux symptoms.  He was taking ibuprofen 800 mg daily.  Also reported chronic intermittent toilet tissue hematochezia, about once a month or less.  Bowels moving well with MiraLAX daily.  His abdominal exam was benign.  Suspected NSAID induced gastritis, possible gastric ulcer and less likely biliary etiology.  Recommended Nexium 40 mg daily, continue Carafate, strict NSAID avoidance, call with progress report in 4 weeks.  Advised to continue to monitor scant toilet tissue hematochezia as his colonoscopy was up-to-date as of January 2021.  Patient called 11/02/2020 reporting Nexium was worsening his abdominal pain so he discontinued it about 2 weeks prior and he ran out of Carafate.  He continued with upper abdominal pain.  Recommended starting omeprazole 40 mg daily and proceeding with EGD.  I also sent a prescription for viscous lidocaine for symptomatic relief.   Patient called to cancel his EGD x2 due to not feeling well.  He was advised to follow-up in the office to regroup.   Today:       Last EGD on file January 2021 due to similar symptoms with mucosal changes in the stomach suspected to be  secondary to NSAIDs (biopsies with chronic gastritis and focal intestinal metaplasia, negative for dysplasia or H. 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    Past Medical History:  Diagnosis Date   Arthritis    CAD (coronary artery disease)    Chronic systolic heart failure (HCC)    Diverticulitis    s/p partial colectomy due to stenotic sigmoind colon   GERD (gastroesophageal reflux disease)    HTN (hypertension)    SVT (supraventricular tachycardia) (HCC)     Past Surgical History:  Procedure Laterality Date   BIOPSY  03/04/2019   Procedure: BIOPSY;  Surgeon: Corbin Ade, MD;  Location: [facility];  Service: Endoscopy;;  gastric   COLONOSCOPY     COLONOSCOPY WITH PROPOFOL N/A 03/04/2019   Surgeon: Corbin Ade, MD;  Diverticulosis in the entire colon with a stenotic, stiff, chronically inflamed sigmoid segment.  Low threshold for surgical consultation.  Grade 1 internal hemorrhoids. Repeat in 10 years.   ESOPHAGOGASTRODUODENOSCOPY (EGD) WITH PROPOFOL N/A 03/04/2019   Surgeon: Corbin Ade, MD;  gastric mucosal changes found to be chronic gastritis and focal intestinal metaplasia negative for H. 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   LAPAROSCOPIC PARTIAL COLECTOMY N/A 07/14/2019   Procedure: LAPAROSCOPIC  LEFT HEMICOLECTOMY;  Surgeon: Lucretia Roers, MD;  Location: [facility];  Service: General;  Laterality: N/A;    Current Outpatient Medications  Medication Sig Dispense Refill   amLODipine (NORVASC) 10 MG tablet Take 1 tablet (10 mg  total) by mouth daily. For Blood Pressure 30 tablet 5   aspirin EC 81 MG tablet Take 1 tablet (81 mg total) by mouth daily. Swallow whole. 90 tablet 3   atorvastatin (LIPITOR) 10 MG tablet Take 0.5 tablets (5 mg total) by mouth daily.     docusate sodium (COLACE) 100 MG capsule Take 100 mg by mouth daily as needed for mild constipation.     lidocaine  (XYLOCAINE) 2 % solution USE AS DIRECTED; 15 MLS in THE MOUTH OR throat EVERY 6 HOURS AS NEEDED FOR UP TO FOUR TIMES DAILY 300 mL 0   lisinopril (ZESTRIL) 40 MG tablet TAKE 1 TABLET BY MOUTH EVERY DAY 30 tablet 6   metoprolol succinate (TOPROL-XL) 50 MG 24 hr tablet Take 1 tablet (50 mg total) by mouth daily. 30 tablet 6   omeprazole (PRILOSEC) 40 MG capsule Take 1 capsule (40 mg total) by mouth daily. 30 capsule 3   ondansetron (ZOFRAN) 4 MG tablet Take 4 mg by mouth every 8 (eight) hours as needed for nausea or vomiting.     polyethylene glycol (MIRALAX / GLYCOLAX) 17 g packet Take 17 g by mouth daily.     sucralfate (CARAFATE) 1 g tablet TAKE 1 TABLET BY MOUTH FOUR TIMES DAILY AS NEEDED WITH MEALS AND AT BEDTIME 120 tablet 3   No current facility-administered medications for this visit.    Allergies as of 03/09/2021 - Review Complete 01/24/2021  Allergen Reaction Noted   Other  02/04/2019    Family History  Problem Relation Age of Onset   Lung cancer Father    Colon cancer Neg Hx    Colon polyps Neg Hx     Social History   Socioeconomic History   Marital status: Single    Spouse name: Not on file   Number of children: Not on file   Years of education: Not on file   Highest education level: Not on file  Occupational History   Not on file  Tobacco Use   Smoking status: Every Day    Packs/day: 0.50    Years: 25.00    Pack years: 12.50    Types: Cigarettes   Smokeless tobacco: Never  Vaping Use   Vaping Use: Never used  Substance and Sexual Activity   Alcohol use: No   Drug use: No   Sexual activity: Yes  Other Topics Concern   Not on file  Social History Narrative   Not on file   Social Determinants of Health   Financial Resource Strain: Not on file  Food Insecurity: Not on file  Transportation Needs: Not on file  Physical Activity: Not on file  Stress: Not on file  Social Connections: Not on file    Review of Systems: Gen: Denies fever, chills, anorexia.  Denies fatigue, weakness, weight loss.  CV: Denies chest pain, palpitations, syncope, peripheral edema, and claudication. Resp: Denies dyspnea at rest, cough, wheezing, coughing up blood, and pleurisy. GI: Denies vomiting blood, jaundice, and fecal incontinence.   Denies dysphagia or odynophagia. Derm: Denies rash, itching, dry skin Psych: Denies depression, anxiety, memory loss, confusion. No homicidal or suicidal ideation.  Heme: Denies bruising, bleeding, and enlarged lymph nodes.  Physical Exam: There were no vitals taken for this visit. General:   Alert and oriented. No distress noted. Pleasant and cooperative.  Head:  Normocephalic and atraumatic. Eyes:  Conjuctiva clear without scleral icterus. Mouth:  Oral mucosa pink and moist. Good dentition. No lesions. Heart:  S1, S2 present without  murmurs appreciated. Lungs:  Clear to auscultation bilaterally. No wheezes, rales, or rhonchi. No distress.  Abdomen:  +BS, soft, non-tender and non-distended. No rebound or guarding. No HSM or masses noted. Msk:  Symmetrical without gross deformities. Normal posture. Extremities:  Without edema. Neurologic:  Alert and  oriented x4 Psych:  Alert and cooperative. Normal mood and affect.

## 2021-03-09 ENCOUNTER — Ambulatory Visit: Payer: Medicaid Other | Admitting: Gastroenterology

## 2021-03-17 ENCOUNTER — Encounter: Payer: Self-pay | Admitting: *Deleted

## 2021-03-17 ENCOUNTER — Telehealth: Payer: Self-pay | Admitting: *Deleted

## 2021-03-17 MED ORDER — ROSUVASTATIN CALCIUM 5 MG PO TABS: 5.0000 mg | ORAL_TABLET | Freq: Every day | ORAL | 6 refills | Status: DC

## 2021-03-17 NOTE — Telephone Encounter (Signed)
Lesle Chris, LPN  08/12/8240 35:36 AM EST Back to Top    Patient notified via letter.  Copy to pcp.     Lesle Chris, LPN  02/09/4313 40:08 AM EST     Left message to return call on mobile & home number.     Lesle Chris, LPN  6/76/1948  3:21 PM EST     Left message to return call.   Lesle Chris, LPN  9/32/6712  1:14 PM EST     Left message to return call.   Antoine Poche, MD  02/08/2021 12:49 PM EST     Cholesterol too high. From nursing notes side effects on higher atorvastati dosing, can he change to crestor 5mg  daily please   MD

## 2021-04-18 ENCOUNTER — Other Ambulatory Visit: Payer: Self-pay

## 2021-04-18 ENCOUNTER — Ambulatory Visit (INDEPENDENT_AMBULATORY_CARE_PROVIDER_SITE_OTHER): Payer: Medicaid Other | Admitting: Cardiology

## 2021-04-18 ENCOUNTER — Encounter: Payer: Self-pay | Admitting: Cardiology

## 2021-04-18 VITALS — BP 126/80 | HR 52 | Ht 72.0 in | Wt 160.0 lb

## 2021-04-18 DIAGNOSIS — I471 Supraventricular tachycardia: Secondary | ICD-10-CM

## 2021-04-18 DIAGNOSIS — I10 Essential (primary) hypertension: Secondary | ICD-10-CM

## 2021-04-18 DIAGNOSIS — E782 Mixed hyperlipidemia: Secondary | ICD-10-CM

## 2021-04-18 DIAGNOSIS — I5022 Chronic systolic (congestive) heart failure: Secondary | ICD-10-CM | POA: Diagnosis not present

## 2021-04-18 DIAGNOSIS — I2510 Atherosclerotic heart disease of native coronary artery without angina pectoris: Secondary | ICD-10-CM | POA: Diagnosis not present

## 2021-04-18 MED ORDER — ROSUVASTATIN CALCIUM 5 MG PO TABS: 5.0000 mg | ORAL_TABLET | Freq: Every day | ORAL | Status: DC

## 2021-04-18 NOTE — Patient Instructions (Signed)

## 2021-04-18 NOTE — Progress Notes (Signed)
? ? ? ?Clinical Summary ?Mr. Sollars is a 51 y.o.male seen today as a new patient for the following medical problems.  ?  ?  ?1. Palpitations/SVT ?- ER visit yesterday with palpitations, nausea, severe SOB ?- from notes EMS found him to be in SVT, given rounds of adenosine without improvement ?- ER notes mention EKG with SVT rate 200 ?- developed some hypotension, required cardioversion ?-after cardioversion discussions for transfer to [facility] main campus but patient signed out AMA ? - Free T4 0.79 TSH 1.18 Mg 1.6 K 3.7 Cr 1.39 ?Hstrop 131-->279 ? ?  ?- coreg caused SOB and fatigue, toprol much better tolerated.  ?-infrequent palpitations.  ?- compliant with toprol. Has made significant improvement in symptoms with toprol ? ?  ?  ?2. HTN ?- admitted 07/2019 with severe HTN, started on cardene drip ?- he is compliant with meds ?  ?  ?3. Chronic systolic HF ?- echo during admission LVEF 40-45%, inferior wall akinetic, basal inferolateral and apical inferior are hypokinetic.  ?  ?  ?- no recent edema ?  ?  ?4. History of diverticulitis ?- recent hemicolectomy 07/2019 ?  ?5. Presumed CAD ?- 08/2019 nuclear stress: large iferior infarct, no ischemia ?- 07/2019 echo inferior wall is akinetic.  ?- no recent symptoms ? ?6. Hyperlipidemia ?- changed to crestor 5mg  daily Jan 2023 ?- side effects on lipitor, stomach discomfort ?- he is not 100% sure he started the crestor ? ?7. Abdominal pain/Nausea ?- followed GI ?Past Medical History:  ?Diagnosis Date  ? Arthritis   ? CAD (coronary artery disease)   ? Chronic systolic heart failure (HCC)   ? Diverticulitis   ? s/p partial colectomy due to stenotic sigmoind colon  ? GERD (gastroesophageal reflux disease)   ? HTN (hypertension)   ? SVT (supraventricular tachycardia) (HCC)   ? ? ? ?Allergies  ?Allergen Reactions  ? Other   ?  States he's allergic to a blood pressure pill that causes abdominal pain. He's not sure name of medication  ? ? ? ?Current Outpatient Medications  ?Medication  Sig Dispense Refill  ? amLODipine (NORVASC) 10 MG tablet Take 1 tablet (10 mg total) by mouth daily. For Blood Pressure 30 tablet 5  ? aspirin EC 81 MG tablet Take 1 tablet (81 mg total) by mouth daily. Swallow whole. 90 tablet 3  ? docusate sodium (COLACE) 100 MG capsule Take 100 mg by mouth daily as needed for mild constipation.    ? lidocaine (XYLOCAINE) 2 % solution USE AS DIRECTED; 15 MLS in THE MOUTH OR throat EVERY 6 HOURS AS NEEDED FOR UP TO FOUR TIMES DAILY 300 mL 0  ? lisinopril (ZESTRIL) 40 MG tablet TAKE 1 TABLET BY MOUTH EVERY DAY 30 tablet 6  ? metoprolol succinate (TOPROL-XL) 50 MG 24 hr tablet Take 1 tablet (50 mg total) by mouth daily. 30 tablet 6  ? omeprazole (PRILOSEC) 40 MG capsule Take 1 capsule (40 mg total) by mouth daily. 30 capsule 3  ? ondansetron (ZOFRAN) 4 MG tablet Take 4 mg by mouth every 8 (eight) hours as needed for nausea or vomiting.    ? polyethylene glycol (MIRALAX / GLYCOLAX) 17 g packet Take 17 g by mouth daily.    ? rosuvastatin (CRESTOR) 5 MG tablet Take 1 tablet (5 mg total) by mouth daily. 30 tablet 6  ? sucralfate (CARAFATE) 1 g tablet TAKE 1 TABLET BY MOUTH FOUR TIMES DAILY AS NEEDED WITH MEALS AND AT BEDTIME 120 tablet 3  ? ?  No current facility-administered medications for this visit.  ? ? ? ?Past Surgical History:  ?Procedure Laterality Date  ? BIOPSY  03/04/2019  ? Procedure: BIOPSY;  Surgeon: Corbin Ade, MD;  Location: [facility];  Service: Endoscopy;;  gastric  ? COLONOSCOPY    ? COLONOSCOPY WITH PROPOFOL N/A 03/04/2019  ? Surgeon: Corbin Ade, MD;  Diverticulosis in the entire colon with a stenotic, stiff, chronically inflamed sigmoid segment.  Low threshold for surgical consultation.  Grade 1 internal hemorrhoids. Repeat in 10 years.  ? ESOPHAGOGASTRODUODENOSCOPY (EGD) WITH PROPOFOL N/A 03/04/2019  ? Surgeon: Corbin Ade, MD;  gastric mucosal changes found to be chronic gastritis and focal intestinal metaplasia negative for H. 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  ? LAPAROSCOPIC PARTIAL COLECTOMY N/A 07/14/2019  ? Procedure: LAPAROSCOPIC  LEFT HEMICOLECTOMY;  Surgeon: Lucretia Roers, MD;  Location: [facility];  Service: General;  Laterality: N/A;  ? ? ? ?Allergies  ?Allergen Reactions  ? Other   ?  States he's allergic to a blood pressure pill that causes abdominal pain. He's not sure name of medication  ? ? ? ? ?Family History  ?Problem Relation Age of Onset  ? Lung cancer Father   ? Colon cancer Neg Hx   ? Colon polyps Neg Hx   ? ? ? ?Social History ?Mr. Shehan reports that he has been smoking cigarettes. He has a 12.50 pack-year smoking history. He has never used smokeless tobacco. ?Mr. Caserta reports no history of alcohol use. ? ? ?Review of Systems ?CONSTITUTIONAL: No weight loss, fever, chills, weakness or fatigue.  ?HEENT: Eyes: No visual loss, blurred vision, double vision or yellow sclerae.No hearing loss, sneezing, congestion, runny nose or sore throat.  ?SKIN: No rash or itching.  ?CARDIOVASCULAR: per hpi ?RESPIRATORY: No shortness of breath, cough or sputum.  ?GASTROINTESTINAL: No anorexia, nausea, vomiting or diarrhea. No abdominal pain or blood.  ?GENITOURINARY: No burning on urination, no polyuria ?NEUROLOGICAL: No headache, dizziness, syncope, paralysis, ataxia, numbness or tingling in the extremities. No change in bowel or bladder control.  ?MUSCULOSKELETAL: No muscle, back pain, joint pain or stiffness.  ?LYMPHATICS: No enlarged nodes. No history of splenectomy.  ?PSYCHIATRIC: No history of depression or anxiety.  ?ENDOCRINOLOGIC: No reports of sweating, cold or heat intolerance. No polyuria or polydipsia.  ?. ? ? ?Physical Examination ?Today's Vitals  ? 04/18/21 0833  ?BP: 126/80  ?Pulse: (!) 52  ?SpO2: 100%  ?Weight: 160 lb (72.6 kg)  ?Height: 6' (1.829 m)  ? ?Body mass index is 21.7 kg/m?. ? ?Gen: resting comfortably, no acute distress ?HEENT: no scleral icterus,  pupils equal round and reactive, no palptable cervical adenopathy,  ?CV: RRR, no m/r/g no jvd ?Resp: Clear to auscultation bilaterally ?GI: abdomen is soft, non-tender, non-distended, normal bowel sounds, no hepatosplenomegaly ?MSK: extremities are warm, no edema.  ?Skin: warm, no rash ?Neuro:  no focal deficits ?Psych: appropriate affect ? ? ?Diagnostic Studies ? ?08/2019 nuclear stress ?There was no ST segment deviation noted during stress. ?Findings consistent with large prior inferior/inferoseptal myocardial infarction. ?This is a high risk study. Risk is based on decreased LVEF, there is prior infarct without myocardium currently at jeopardy. Consider correlating LVEF with echo to better assess risk. ?The left ventricular ejection fraction is moderately decreased (30%). ? ? ?Assessment and Plan  ?SVT ?- new diagnosis during recent ER admission, converted with cardioversion ?-overall doing well on toprol. Very rare episodes, if more  frequent could add on prn lopressor. Low HRs would not increase his daily toprol ?  ?2. Chronic systolic HF ?- mild LV systolic dysfunction by echo ?- EKG, echo, nuclear stress  would suggest potential prior inferior infarct ?- we will repeat echo. If ongoing dysfunction change ACEi to entresto, consdier aldactone and SGLT2i ?  ?3. Probable CAD ?- based on imaging ?- no recent symptoms, continue current meds ? ?4. Hyperlipidemia ?- side effects on very low dose lipitor, was to change to crestor 5mg  daily. He is not sure if he started crestor or not, we will contact his pharmacy ? ?F/u 6 months ? ? ? ? , M.D., ?

## 2021-04-18 NOTE — Addendum Note (Signed)
Addended by: Lesle Chris on: 04/18/2021 10:08 AM ? ? Modules accepted: Orders ? ?

## 2021-04-20 NOTE — Progress Notes (Signed)
? ? ?Referring Provider: Leonie Douglas, MD ?Primary Care Physician:  Leonie Douglas, MD ?Primary GI Physician: Dr. Gala Romney ? ?Chief Complaint  ?Patient presents with  ? Follow-up  ?  Nausea,upper pain in stomach   ? ? ?HPI:   ?Anthony Vaughn is a 51 y.o. male with history of GERD, epigastric pain in the setting of NSAIDs previously improved with PPI, recurrent diverticulitis, stenotic, stiff, chronic inflamed sigmoid segment on colonoscopy in January 2021 s/p partial colectomy June 2021 with Dr. Constance Haw, chronic toilet tissue hematochezia, presenting today for follow-up of upper abdominal pain. ? ?Last EGD in January 2021 for upper abdominal pain with mucosal changes in the stomach suspected to be secondary to NSAIDs (biopsies with chronic gastritis and focal intestinal metaplasia, negative for dysplasia or H. 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   ? ?CT A/P with contrast April 2021 with normal-appearing gallbladder and biliary tree. ? ?Last seen in our office 10/21/2020.  He reported recurrent upper abdominal pain.  He was not taking a PPI, but rather was taking Carafate 4 times daily, but sometimes more often.  Carafate improved symptoms, but when he runs out, he would develop abdominal pain that radiated "everywhere" with associated nausea without vomiting.  Admitted to 800 mg ibuprofen daily.  His abdominal exam was benign.  Suspected NSAID induced gastritis versus PUD.  Recommended Nexium 40 mg daily, continue Carafate, strict NSAID avoidance, and call with progress report in 4 weeks. ? ?Telephone call 11/02/2020 with patient reporting Nexium was not working.  Felt this was causing abdominal pain and quit taking it.  Recommended omeprazole 40 mg daily, viscous lidocaine as needed, continue Carafate, and proceed with EGD. ? ?He was scheduled for EGD on 12/17/2020.  He called stating that he was sick and was rescheduled to 12/22, but no showed.  ? ?Today:  ?Continues to have  intermittent daily burning in the epigastric area with associated nausea without vomiting.  Also states it feels like a griping pain.  Symptoms improved with Carafate which she is taking 3-4 times a day.  He will also use viscous lidocaine as needed though uses fairly sparingly.  Take Zofran for nausea daily to twice daily.  He is not taking omeprazole.  Does not remember this medication.  His abdominal pain is not triggered by meals.  States eating or drinking water will actually help at times. ? ?He does report some mild early satiety and states he feels his abdomen gets tight if he eats too much.  Sometimes feels foods are just sitting in his stomach.  Denies heartburn, reflux symptoms or dysphagia. ? ?Ibuprofen: 800 mg daily for HA and stomach pain.  ? ?Episode of rectal bleeding last month that was more than normal.  Felt like he needed to have a bowel movement, but ended up passing blood only.  This occurred once.  None since. Chronic intermittent toilet tissue hematochezia that had previously been occurring about once a month.  No constipation.  Takes MiraLAX daily. ? ?Past Medical History:  ?Diagnosis Date  ? Arthritis   ? CAD (coronary artery disease)   ? Chronic systolic heart failure ([place])   ? Diverticulitis   ? s/p partial colectomy due to stenotic sigmoind colon  ? GERD (gastroesophageal reflux disease)   ? HTN (hypertension)   ? SVT (supraventricular tachycardia) ([place])   ? ? ?Past Surgical History:  ?Procedure Laterality Date  ? BIOPSY  03/04/2019  ? Procedure: BIOPSY;  Surgeon: Daneil Dolin, MD;  [facility];  Service: Endoscopy;;  gastric  ? COLONOSCOPY    ? COLONOSCOPY WITH PROPOFOL N/A 03/04/2019  ? Surgeon: Daneil Dolin, MD;  Diverticulosis in the entire colon with a stenotic, stiff, chronically inflamed sigmoid segment.  Low threshold for surgical consultation.  Grade 1 internal hemorrhoids. Repeat in 10 years.  ? ESOPHAGOGASTRODUODENOSCOPY (EGD) WITH PROPOFOL N/A 03/04/2019  ?  Surgeon: Daneil Dolin, MD;  gastric mucosal changes found to be chronic gastritis and focal intestinal metaplasia negative for H. 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  ? LAPAROSCOPIC PARTIAL COLECTOMY N/A 07/14/2019  ? Procedure: LAPAROSCOPIC  LEFT HEMICOLECTOMY;  Surgeon: Virl Cagey, MD;  Location: [facility];  Service: General;  Laterality: N/A;  ? ? ?Current Outpatient Medications  ?Medication Sig Dispense Refill  ? amLODipine (NORVASC) 10 MG tablet Take 1 tablet (10 mg total) by mouth daily. For Blood Pressure 30 tablet 5  ? aspirin EC 81 MG tablet Take 1 tablet (81 mg total) by mouth daily. Swallow whole. 90 tablet 3  ? docusate sodium (COLACE) 100 MG capsule Take 100 mg by mouth daily as needed for mild constipation.    ? lisinopril (ZESTRIL) 40 MG tablet TAKE 1 TABLET BY MOUTH EVERY DAY 30 tablet 6  ? ondansetron (ZOFRAN) 4 MG tablet Take 4 mg by mouth every 8 (eight) hours as needed for nausea or vomiting.    ? polyethylene glycol (MIRALAX / GLYCOLAX) 17 g packet Take 17 g by mouth daily.    ? rosuvastatin (CRESTOR) 5 MG tablet Take 1 tablet (5 mg total) by mouth daily.    ? lidocaine (XYLOCAINE) 2 % solution Place 15 mLs on the back of the tongue and swallow every 6 (six) hours as needed for abdominal pain. 300 mL 1  ? metoprolol succinate (TOPROL-XL) 50 MG 24 hr tablet Take 1 tablet (50 mg total) by mouth daily. 30 tablet 6  ? omeprazole (PRILOSEC) 40 MG capsule Take 1 capsule (40 mg total) by mouth 2 (two) times daily before a meal. 60 capsule 3  ? sucralfate (CARAFATE) 1 g tablet Take 1 tablet (1 g total) by mouth 4 (four) times daily. 120 tablet 3  ? ?No current facility-administered medications for this visit.  ? ? ?Allergies as of 04/21/2021 - Review Complete 04/21/2021  ?Allergen Reaction Noted  ? Other  02/04/2019  ? ? ?Family History  ?Problem Relation Age of Onset  ? Lung cancer Father   ? Colon cancer Neg Hx   ? Colon polyps  Neg Hx   ? ? ?Social History  ? ?Socioeconomic History  ? Marital status: Single  ?  Spouse name: Not on file  ? Number of children: Not on file  ? Years of education: Not on file  ? Highest education level: Not on file  ?Occupational History  ? Not on file  ?Tobacco Use  ? Smoking status: Every Day  ?  Packs/day: 0.50  ?  Years: 25.00  ?  Pack years: 12.50  ?  Types: Cigarettes  ? Smokeless tobacco: Never  ?Vaping Use  ? Vaping Use: Never used  ?Substance and Sexual Activity  ? Alcohol use: No  ? Drug use: No  ? Sexual activity: Yes  ?Other Topics Concern  ? Not on file  ?Social History Narrative  ? Not on file  ? ?Social Determinants of Health  ? ?Financial Resource Strain: Not on file  ?Food Insecurity: Not on file  ?  Transportation Needs: Not on file  ?Physical Activity: Not on file  ?Stress: Not on file  ?Social Connections: Not on file  ? ? ?Review of Systems: ?Gen: Denies fever, chills, cold or flulike symptoms, presyncope, syncope. ?CV: Denies chest pain, palpitations. ?Resp: Denies dyspnea or cough. ?GI: See HPI ?Heme: See HPI ? ?Physical Exam: ?BP 132/72   Pulse 76   Temp 97.6 ?F (36.4 ?C) (Temporal)   Ht 6' (1.829 m)   Wt 158 lb 12.8 oz (72 kg)   BMI 21.54 kg/m?  ?General:   Alert and oriented. No distress noted. Pleasant and cooperative.  ?Head:  Normocephalic and atraumatic. ?Eyes:  Conjuctiva clear without scleral icterus. ?Heart:  S1, S2 present without murmurs appreciated. ?Lungs:  Clear to auscultation bilaterally. No wheezes, rales, or rhonchi. No distress.  ?Abdomen:  +BS, soft, non-tender and non-distended. No rebound or guarding. No HSM or masses noted. ?Msk:  Symmetrical without gross deformities. Normal posture. ?Extremities:  Without edema. ?Neurologic:  Alert and  oriented x4 ?Psych: Normal mood and affect.  ? ? ?Assessment: ?51 year old male with history of GERD, epigastric pain in the setting of NSAIDs previously that improved with PPI, recurrent diverticulitis, stenotic, stiff,  chronic inflamed sigmoid segment on colonoscopy in January 2021 s/p partial colectomy June 2021 with Dr. Constance Haw, chronic toilet tissue hematochezia, presenting today for follow-up. ? ?Epigastric abdominal pain: ?Previ

## 2021-04-21 ENCOUNTER — Other Ambulatory Visit: Payer: Self-pay

## 2021-04-21 ENCOUNTER — Encounter: Payer: Self-pay | Admitting: Gastroenterology

## 2021-04-21 ENCOUNTER — Ambulatory Visit (INDEPENDENT_AMBULATORY_CARE_PROVIDER_SITE_OTHER): Payer: Medicaid Other | Admitting: Gastroenterology

## 2021-04-21 VITALS — BP 132/72 | HR 76 | Temp 97.6°F | Ht 72.0 in | Wt 158.8 lb

## 2021-04-21 DIAGNOSIS — R110 Nausea: Secondary | ICD-10-CM

## 2021-04-21 DIAGNOSIS — K625 Hemorrhage of anus and rectum: Secondary | ICD-10-CM

## 2021-04-21 DIAGNOSIS — R1010 Upper abdominal pain, unspecified: Secondary | ICD-10-CM | POA: Diagnosis not present

## 2021-04-21 MED ORDER — SUCRALFATE 1 G PO TABS: 1.0000 g | ORAL_TABLET | Freq: Four times a day (QID) | ORAL | 3 refills | Status: DC

## 2021-04-21 MED ORDER — OMEPRAZOLE 40 MG PO CPDR: 40.0000 mg | DELAYED_RELEASE_CAPSULE | Freq: Two times a day (BID) | ORAL | 3 refills | Status: DC

## 2021-04-21 MED ORDER — LIDOCAINE VISCOUS HCL 2 % MT SOLN: OROMUCOSAL | 1 refills | Status: DC

## 2021-04-21 NOTE — Patient Instructions (Signed)
Please have blood work completed at [facility]. ? ?We will arrange for you to have an upper endoscopy and colonoscopy in the near future with Dr. Jena Gauss. ? ?Start omeprazole 40 mg twice daily 30 minutes before breakfast and 30 minutes before dinner. ? ?Continue Zofran 4 mg every 8 hours as needed for nausea. ? ?Continue Carafate 1 g 4 times daily as needed for upper abdominal pain. ? ?Continue to use lidocaine 15 mL as needed every 6 hours for upper abdominal pain. ? ?Please stop using ibuprofen and avoid all NSAID products including Aleve, Advil, naproxen, BC powders, Goody powders, and anything that says "NSAID" on the package. ? ?You can use Tylenol as needed.  No more than 3000 mg/day. ? ?We will follow-up with you after procedures. ? ?Ermalinda Memos, PA-C ?[facility] ? ? ? ?

## 2021-04-25 ENCOUNTER — Ambulatory Visit (HOSPITAL_COMMUNITY): Admission: RE | Admit: 2021-04-25 | Payer: Medicaid Other | Source: Ambulatory Visit

## 2021-05-11 ENCOUNTER — Telehealth: Payer: Self-pay | Admitting: *Deleted

## 2021-05-11 NOTE — Telephone Encounter (Signed)
LMOVM to call back to schedule TCS/EGD W/ PROPOFOL ASA 3 Dr. Jena Gauss ?

## 2021-05-12 ENCOUNTER — Other Ambulatory Visit: Payer: Self-pay | Admitting: Gastroenterology

## 2021-05-12 ENCOUNTER — Encounter: Payer: Self-pay | Admitting: *Deleted

## 2021-05-12 ENCOUNTER — Telehealth: Payer: Self-pay | Admitting: Internal Medicine

## 2021-05-12 ENCOUNTER — Telehealth: Payer: Self-pay | Admitting: *Deleted

## 2021-05-12 DIAGNOSIS — R1010 Upper abdominal pain, unspecified: Secondary | ICD-10-CM

## 2021-05-12 DIAGNOSIS — K219 Gastro-esophageal reflux disease without esophagitis: Secondary | ICD-10-CM

## 2021-05-12 MED ORDER — PANTOPRAZOLE SODIUM 40 MG PO TBEC: 40.0000 mg | DELAYED_RELEASE_TABLET | Freq: Two times a day (BID) | ORAL | 3 refills | Status: DC

## 2021-05-12 MED ORDER — CLENPIQ 10-3.5-12 MG-GM -GM/160ML PO SOLN
1.0000 | Freq: Once | ORAL | 0 refills | Status: AC
Start: 2021-05-12 — End: 2021-05-12

## 2021-05-12 NOTE — Telephone Encounter (Signed)
Spoke to pt, informed him that new medication was sent to pharmacy and he is to take twice daily 30 minutes before meals.  ?

## 2021-05-12 NOTE — Telephone Encounter (Signed)
Spoke to pt, he informed me that Omeprazole makes is stomach cramp really bad and it last a long time. Wants to know if there is something else he can take? ?

## 2021-05-12 NOTE — Telephone Encounter (Signed)
Patient reports he is having issues with his omeprazole. Please call him ?

## 2021-05-12 NOTE — Telephone Encounter (Signed)
See previous note

## 2021-05-12 NOTE — Addendum Note (Signed)
Addended by: Armstead Peaks on: 05/12/2021 09:14 AM ? ? Modules accepted: Orders ? ?

## 2021-05-12 NOTE — Telephone Encounter (Signed)
PA approved. Auth# [id number], DOS 06/23/21-09/21/21 ?

## 2021-05-12 NOTE — Telephone Encounter (Signed)
Yes. I have sent in a Rx for Pantoprazole 40 mg twice daily 30 minutes before meals.  ?

## 2021-05-12 NOTE — Telephone Encounter (Signed)
Pt said he was returning a call from [place]. Please call [phone] ?

## 2021-05-12 NOTE — Telephone Encounter (Signed)
Spoke with pt. He has been scheduled for 5/18 at 1:30pm. Aware will mail prep instructions/pre-op appt. Will send rx to pharmacy.  ? ?PA for TCS/EGD pending review via [employer]. Tracking # [id number] ?

## 2021-05-23 LAB — CBC WITH DIFFERENTIAL/PLATELET
Absolute Monocytes: 337 cells/uL (ref 200–950)
Basophils Absolute: 31 cells/uL (ref 0–200)
Basophils Relative: 0.9 %
Eosinophils Absolute: 109 cells/uL (ref 15–500)
Eosinophils Relative: 3.2 %
HCT: 39.4 % (ref 38.5–50.0)
Hemoglobin: 12.7 g/dL — ABNORMAL LOW (ref 13.2–17.1)
Lymphs Abs: 1578 cells/uL (ref 850–3900)
MCH: 27.9 pg (ref 27.0–33.0)
MCHC: 32.2 g/dL (ref 32.0–36.0)
MCV: 86.4 fL (ref 80.0–100.0)
MPV: 10.7 fL (ref 7.5–12.5)
Monocytes Relative: 9.9 %
Neutro Abs: 1346 cells/uL — ABNORMAL LOW (ref 1500–7800)
Neutrophils Relative %: 39.6 %
Platelets: 253 10*3/uL (ref 140–400)
RBC: 4.56 10*6/uL (ref 4.20–5.80)
RDW: 14.5 % (ref 11.0–15.0)
Total Lymphocyte: 46.4 %
WBC: 3.4 10*3/uL — ABNORMAL LOW (ref 3.8–10.8)

## 2021-05-23 LAB — COMPLETE METABOLIC PANEL WITH GFR
AG Ratio: 1.4 (calc) (ref 1.0–2.5)
ALT: 10 U/L (ref 9–46)
AST: 12 U/L (ref 10–40)
Albumin: 3.7 g/dL (ref 3.6–5.1)
Alkaline phosphatase (APISO): 52 U/L (ref 36–130)
BUN: 16 mg/dL (ref 7–25)
CO2: 29 mmol/L (ref 20–32)
Calcium: 9.7 mg/dL (ref 8.6–10.3)
Chloride: 106 mmol/L (ref 98–110)
Creat: 1.28 mg/dL (ref 0.60–1.29)
Globulin: 2.6 g/dL (calc) (ref 1.9–3.7)
Glucose, Bld: 90 mg/dL (ref 65–99)
Potassium: 4.3 mmol/L (ref 3.5–5.3)
Sodium: 139 mmol/L (ref 135–146)
Total Bilirubin: 0.2 mg/dL (ref 0.2–1.2)
Total Protein: 6.3 g/dL (ref 6.1–8.1)
eGFR: 69 mL/min/{1.73_m2} (ref >=60)

## 2021-05-23 LAB — LIPASE: Lipase: 42 U/L (ref 7–60)

## 2021-05-25 ENCOUNTER — Other Ambulatory Visit: Payer: Self-pay | Admitting: *Deleted

## 2021-05-25 DIAGNOSIS — D649 Anemia, unspecified: Secondary | ICD-10-CM

## 2021-05-27 ENCOUNTER — Ambulatory Visit (HOSPITAL_COMMUNITY): Admission: RE | Admit: 2021-05-27 | Payer: Medicaid Other | Source: Ambulatory Visit

## 2021-06-03 ENCOUNTER — Telehealth: Payer: Self-pay | Admitting: Internal Medicine

## 2021-06-03 NOTE — Telephone Encounter (Signed)
Pt called to let us know that he has blood in his stool, his stomach has been "nagging" him for 2 weeks and feels bloated and is uncomfortable. He was asking for the nurse to call him with recommendations or either call something in to his pharmacy Dr. [facility]). Please call [phone] ?

## 2021-06-06 ENCOUNTER — Telehealth: Payer: Self-pay | Admitting: Cardiology

## 2021-06-06 NOTE — Telephone Encounter (Signed)
Lmom for pt to return my call.  

## 2021-06-06 NOTE — Telephone Encounter (Signed)
Patient states that he is feeling better now and is no longer having any blood in his stool. Advised patient to call us back if he started having problems again in the future. Pt verbalized understanding.  ?

## 2021-06-06 NOTE — Telephone Encounter (Signed)
LMOM for pt to call office back 

## 2021-06-06 NOTE — Telephone Encounter (Signed)
Left message to return call on home number. ?Mobile number - call would not go through.   ?

## 2021-06-06 NOTE — Telephone Encounter (Signed)
Patient is calling requesting a callback to discuss his medications.  ?

## 2021-06-10 NOTE — Telephone Encounter (Signed)
Attempted to reach patient again - ? ?Mobile number - would not go through again ? ?Home number - busy  ?

## 2021-06-20 ENCOUNTER — Other Ambulatory Visit: Payer: Self-pay | Admitting: Gastroenterology

## 2021-06-20 DIAGNOSIS — R1010 Upper abdominal pain, unspecified: Secondary | ICD-10-CM

## 2021-06-20 NOTE — Patient Instructions (Signed)
? ? ? ? ? ? ? ? Anthony Vaughn ? 06/20/2021  ?  ? @PREFPERIOPPHARMACY @ ? ? Your procedure is scheduled on  06/23/2021. ? ? Report to 06/25/2021 at  1215  P.M. ? ? Call this number if you have problems the morning of surgery: ? [phone] ? ? Remember: ? Follow the diet and prep instructions given to you by the office. ?  ? Take these medicines the morning of surgery with A SIP OF WATER  ? ?       amlodipine, metoprolol, zofran (if needed), protonix. ?  ? ? Do not wear jewelry, make-up or nail polish. ? Do not wear lotions, powders, or perfumes, or deodorant. ? Do not shave 48 hours prior to surgery.  Men may shave face and neck. ? Do not bring valuables to the hospital. ? [facility] is not responsible for any belongings or valuables. ? ?Contacts, dentures or bridgework may not be worn into surgery.  Leave your suitcase in the car.  After surgery it may be brought to your room. ? ?For patients admitted to the hospital, discharge time will be determined by your treatment team. ? ?Patients discharged the day of surgery will not be allowed to drive home and must  have someone with them for 24 hours.  ? ? ?Special instructions:   DO NOT smoke tobacco or vape for 24 hours before your procedure. ? ?Please read over the following fact sheets that you were given. ?Anesthesia Post-op Instructions and Care and Recovery After Surgery ?  ? ? ? Upper Endoscopy, Adult, Care After ?This sheet gives you information about how to care for yourself after your procedure. Your health care provider may also give you more specific instructions. If you have problems or questions, contact your health care provider. ?What can I expect after the procedure? ?After the procedure, it is common to have: ?A sore throat. ?Mild stomach pain or discomfort. ?Bloating. ?Nausea. ?Follow these instructions at home: ? ?Follow instructions from your health care provider about what to eat or drink after your procedure. ?Return to your normal activities  as told by your health care provider. Ask your health care provider what activities are safe for you. ?Take over-the-counter and prescription medicines only as told by your health care provider. ?If you were given a sedative during the procedure, it can affect you for several hours. Do not drive or operate machinery until your health care provider says that it is safe. ?Keep all follow-up visits as told by your health care provider. This is important. ?Contact a health care provider if you have: ?A sore throat that lasts longer than one day. ?Trouble swallowing. ?Get help right away if: ?You vomit blood or your vomit looks like coffee grounds. ?You have: ?A fever. ?Bloody, black, or tarry stools. ?A severe sore throat or you cannot swallow. ?Difficulty breathing. ?Severe pain in your chest or abdomen. ?Summary ?After the procedure, it is common to have a sore throat, mild stomach discomfort, bloating, and nausea. ?If you were given a sedative during the procedure, it can affect you for several hours. Do not drive or operate machinery until your health care provider says that it is safe. ?Follow instructions from your health care provider about what to eat or drink after your procedure. ?Return to your normal activities as told by your health care provider. ?This information is not intended to replace advice given to you by your health care provider. Make sure you discuss any questions you  have with your health care provider. ?Document Revised: 11/29/2018 Document Reviewed: 06/25/2017 ?Elsevier Patient Education ? 2023 Elsevier Inc. ?Colonoscopy, Adult, Care After ?The following information offers guidance on how to care for yourself after your procedure. Your health care provider may also give you more specific instructions. If you have problems or questions, contact your health care provider. ?What can I expect after the procedure? ?After the procedure, it is common to have: ?A small amount of blood in your stool  for 24 hours after the procedure. ?Some gas. ?Mild cramping or bloating of your abdomen. ?Follow these instructions at home: ?Eating and drinking ? ?Drink enough fluid to keep your urine pale yellow. ?Follow instructions from your health care provider about eating or drinking restrictions. ?Resume your normal diet as told by your health care provider. Avoid heavy or fried foods that are hard to digest. ?Activity ?Rest as told by your health care provider. ?Avoid sitting for a long time without moving. Get up to take short walks every 1-2 hours. This is important to improve blood flow and breathing. Ask for help if you feel weak or unsteady. ?Return to your normal activities as told by your health care provider. Ask your health care provider what activities are safe for you. ?Managing cramping and bloating ? ?Try walking around when you have cramps or feel bloated. ?If directed, apply heat to your abdomen as told by your health care provider. Use the heat source that your health care provider recommends, such as a moist heat pack or a heating pad. ?Place a towel between your skin and the heat source. ?Leave the heat on for 20-30 minutes. ?Remove the heat if your skin turns bright red. This is especially important if you are unable to feel pain, heat, or cold. You have a greater risk of getting burned. ?General instructions ?If you were given a sedative during the procedure, it can affect you for several hours. Do not drive or operate machinery until your health care provider says that it is safe. ?For the first 24 hours after the procedure: ?Do not sign important documents. ?Do not drink alcohol. ?Do your regular daily activities at a slower pace than normal. ?Eat soft foods that are easy to digest. ?Take over-the-counter and prescription medicines only as told by your health care provider. ?Keep all follow-up visits. This is important. ?Contact a health care provider if: ?You have blood in your stool 2-3 days after  the procedure. ?Get help right away if: ?You have more than a small spotting of blood in your stool. ?You have large blood clots in your stool. ?You have swelling of your abdomen. ?You have nausea or vomiting. ?You have a fever. ?You have increasing pain in your abdomen that is not relieved with medicine. ?These symptoms may be an emergency. Get help right away. Call 911. ?Do not wait to see if the symptoms will go away. ?Do not drive yourself to the hospital. ?Summary ?After the procedure, it is common to have a small amount of blood in your stool. You may also have mild cramping and bloating of your abdomen. ?If you were given a sedative during the procedure, it can affect you for several hours. Do not drive or operate machinery until your health care provider says that it is safe. ?Get help right away if you have a lot of blood in your stool, nausea or vomiting, a fever, or increased pain in your abdomen. ?This information is not intended to replace advice given to you  by your health care provider. Make sure you discuss any questions you have with your health care provider. ?Document Revised: 09/15/2020 Document Reviewed: 09/15/2020 ?Elsevier Patient Education ? 2023 Elsevier Inc. ?Monitored Anesthesia Care, Care After ?This sheet gives you information about how to care for yourself after your procedure. Your health care provider may also give you more specific instructions. If you have problems or questions, contact your health care provider. ?What can I expect after the procedure? ?After the procedure, it is common to have: ?Tiredness. ?Forgetfulness about what happened after the procedure. ?Impaired judgment for important decisions. ?Nausea or vomiting. ?Some difficulty with balance. ?Follow these instructions at home: ?For the time period you were told by your health care provider: ? ?  ? ?Rest as needed. ?Do not participate in activities where you could fall or become injured. ?Do not drive or use  machinery. ?Do not drink alcohol. ?Do not take sleeping pills or medicines that cause drowsiness. ?Do not make important decisions or sign legal documents. ?Do not take care of children on your own. ?Eating and d

## 2021-06-21 ENCOUNTER — Telehealth: Payer: Self-pay

## 2021-06-21 ENCOUNTER — Encounter (HOSPITAL_COMMUNITY)
Admission: RE | Admit: 2021-06-21 | Discharge: 2021-06-21 | Disposition: A | Discharge status: Home / Self Care | Payer: Medicaid Other | Source: Ambulatory Visit | Attending: Internal Medicine | Admitting: Internal Medicine

## 2021-06-21 NOTE — Telephone Encounter (Signed)
Kim at [facility] endo said pt was no show for pre-op appt this morning. He has recent labs and could have phone pre-op. Selena Batten was unable to reach pt by phone. I tried to call pt, LMOVM for return call. ?

## 2021-06-21 NOTE — Pre-Procedure Instructions (Signed)
Patient was a no show for his preop. We were unable to contact him but did leave a voicemail for home to call back. Dr Luvenia Starch office also notified. ?

## 2021-06-22 NOTE — Telephone Encounter (Signed)
Anthony Cadet, RN  Anthony Vaughn, CMA ?Patient is not feeling well and would like to reschedule.  He is weak and having lots of rectal bleeding. Patient was instructed to go to the Emergency Room. Please follow up with him about a future TCS/EGD. ?_______________________________________________________________________ ? ?Called pt, LMOVM to call back ?

## 2021-06-23 ENCOUNTER — Ambulatory Visit (HOSPITAL_COMMUNITY): Admission: RE | Admit: 2021-06-23 | Payer: Medicaid Other | Source: Home / Self Care | Admitting: Internal Medicine

## 2021-06-23 ENCOUNTER — Encounter (HOSPITAL_COMMUNITY): Admission: RE | Payer: Self-pay | Source: Home / Self Care

## 2021-06-23 SURGERY — COLONOSCOPY WITH PROPOFOL
Anesthesia: Monitor Anesthesia Care

## 2021-06-23 NOTE — Telephone Encounter (Signed)
LMOVM to call back 

## 2021-06-23 NOTE — Telephone Encounter (Signed)
LETTER MAILED. FYI to [employer]

## 2021-06-23 NOTE — Telephone Encounter (Signed)
Noted.  I agree with Othelia Pulling, RN recommendation for patient to proceed to the emergency room due to weakness and rectal bleeding.  It does not appear that he has been to the emergency room, at least not within the [facility] system.

## 2021-06-24 NOTE — Telephone Encounter (Signed)
I also tried calling patient this morning to follow-up with him. [facility] requesting return call.

## 2021-07-13 ENCOUNTER — Other Ambulatory Visit: Payer: Self-pay | Admitting: Cardiology

## 2021-08-01 ENCOUNTER — Telehealth: Payer: Self-pay | Admitting: *Deleted

## 2021-08-01 NOTE — Telephone Encounter (Signed)
Spoke to pt, he informed me that he is still having sharp pains in his stomach and blood in stool. He thinks he has loss 10 to 15 pounds in the last couple week. Would like an appointment soon?

## 2021-08-02 ENCOUNTER — Telehealth: Payer: Self-pay | Admitting: *Deleted

## 2021-08-02 NOTE — Progress Notes (Deleted)
Referring Provider: Waldon Reining, MD Primary Care Physician:  Waldon Reining, MD Primary GI Physician: Dr. Jena Gauss  No chief complaint on file.   HPI:   Anthony Vaughn is a 51 y.o. male presenting today with a history of GERD, epigastric pain in the setting of NSAIDs previously improved with PPI, recurrent diverticulitis, stenotic, stiff, chronic inflamed sigmoid segment on colonoscopy in January 2021 s/p partial colectomy June 2021 with Dr. Henreitta Leber, chronic toilet tissue hematochezia, presenting today for follow-up of upper abdominal pain, nausea, and rectal bleeding.   Most recent EGD January 2021 for upper abdominal pain with mucosal changes in the stomach suspected to be secondary to NSAIDs (biopsies with chronic gastritis with focal intestinal metaplasia, negative for dysplasia or H. 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  CT A/P with contrast April 2021 with normal-appearing gallbladder and biliary tree.  He reported recurrent upper abdominal pain in September 2022, was off PPI at that time, taking Carafate multiple times a day which improved his symptoms.  He was taking 800 mg ibuprofen daily.  Abdominal exam was benign at that point.  He was started on Nexium 40 mg daily.  He ultimately felt Nexium was causing worsening abdominal pain and will started on omeprazole 40 mg daily and was provided viscous lidocaine as well.  Recommended EGD.  He was scheduled for an EGD in November, but rescheduled to December due to being sick and ultimately no-showed.  He was last seen in our office 04/21/2021 and continued with intermittent daily burning in the epigastric area with associated nausea without vomiting, improved by Carafate.  Occasionally using viscous lidocaine.  Using Zofran for nausea.  He was not taking omeprazole.  Denied any postprandial symptoms and felt eating or drinking actually helped at times.  He did report mild early satiety and tightening of  his abdomen and eating too much.  Denies heartburn or dysphagia.  Still taking ibuprofen 800 mg daily.  Also experienced an episode of rectal bleeding the month prior that was larger in volume than normal.  Stated he felt like he needed to have a bowel movement, but in the passing blood only.  He had no recurrent symptoms, but continued with chronic intermittent toilet tissue hematochezia about once a month.  Denied constipation on MiraLAX daily.  Noted that he had documented 12 pound weight loss over the last 6 months.  Recommended updating labs, starting omeprazole twice daily, continue Carafate, stop NSAIDs, proceed with EGD and colonoscopy.  Consider CT if procedures unrevealing and weight loss continued.  Labs showed hemoglobin of 12.7 (L), CMP within normal limits, lipase normal.  Recommended iron panel, but this was never completed.  Patient called to report omeprazole was causing abdominal cramping.  He was transitioned to pantoprazole.  He was scheduled for EGD and colonoscopy on 5/18; however, patient no-show for preop.  He reported he was not feeling well and requested to reschedule.  He was weak and having rectal bleeding.  He was instructed to go to the emergency room, does not appear that he went.  We tried to reach him thereafter, but were unsuccessful.  Patient called 6/26 reporting abdominal pain, rectal bleeding, weight loss.  Recommended office visit this week to discuss rescheduling procedures.    Past Medical History:  Diagnosis Date   Arthritis    CAD (coronary artery disease)    Chronic systolic heart failure (HCC)    Diverticulitis    s/p partial colectomy due to stenotic sigmoind colon  GERD (gastroesophageal reflux disease)    HTN (hypertension)    SVT (supraventricular tachycardia) [facility])     Past Surgical History:  Procedure Laterality Date   BIOPSY  03/04/2019   Procedure: BIOPSY;  Surgeon: Corbin Ade, MD;  Location: [facility] ENDO SUITE;  Service: Endoscopy;;   gastric   COLONOSCOPY     COLONOSCOPY WITH PROPOFOL N/A 03/04/2019   Surgeon: Corbin Ade, MD;  Diverticulosis in the entire colon with a stenotic, stiff, chronically inflamed sigmoid segment.  Low threshold for surgical consultation.  Grade 1 internal hemorrhoids. Repeat in 10 years.   ESOPHAGOGASTRODUODENOSCOPY (EGD) WITH PROPOFOL N/A 03/04/2019   Surgeon: Corbin Ade, MD;  gastric mucosal changes found to be chronic gastritis and focal intestinal metaplasia negative for H. 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   LAPAROSCOPIC PARTIAL COLECTOMY N/A 07/14/2019   Procedure: LAPAROSCOPIC  LEFT HEMICOLECTOMY;  Surgeon: Lucretia Roers, MD;  Location: [facility] ORS;  Service: General;  Laterality: N/A;    Current Outpatient Medications  Medication Sig Dispense Refill   amLODipine (NORVASC) 10 MG tablet Take 1 tablet (10 mg total) by mouth daily. For Blood Pressure 30 tablet 5   aspirin EC 81 MG tablet Take 1 tablet (81 mg total) by mouth daily. Swallow whole. 90 tablet 3   docusate sodium (COLACE) 100 MG capsule Take 100 mg by mouth daily as needed for mild constipation.     lidocaine (XYLOCAINE) 2 % solution place ON back of THE TONGUE AND SWALLOW EVERY 6 HOURS AS NEEDED FOR ABDOMINAL pain 300 mL 1   lisinopril (ZESTRIL) 40 MG tablet TAKE 1 TABLET BY MOUTH EVERY DAY 30 tablet 6   metoprolol succinate (TOPROL-XL) 50 MG 24 hr tablet TAKE 1 TABLET BY MOUTH EVERY DAY - STOP COREG 90 tablet 2   ondansetron (ZOFRAN) 4 MG tablet Take 4 mg by mouth every 8 (eight) hours as needed for nausea or vomiting.     pantoprazole (PROTONIX) 40 MG tablet Take 1 tablet (40 mg total) by mouth 2 (two) times daily before a meal. 60 tablet 3   polyethylene glycol (MIRALAX / GLYCOLAX) 17 g packet Take 17 g by mouth daily.     rosuvastatin (CRESTOR) 5 MG tablet Take 1 tablet (5 mg total) by mouth daily.     sildenafil (REVATIO) 20 MG tablet Take 20-60  mg by mouth daily as needed for erectile dysfunction.     sucralfate (CARAFATE) 1 g tablet Take 1 tablet (1 g total) by mouth 4 (four) times daily. 120 tablet 3   sucralfate (CARAFATE) 1 GM/10ML suspension Take 1 g by mouth 4 (four) times daily as needed.     No current facility-administered medications for this visit.    Allergies as of 08/03/2021 - Review Complete 04/21/2021  Allergen Reaction Noted   Other  02/04/2019    Family History  Problem Relation Age of Onset   Lung cancer Father    Colon cancer Neg Hx    Colon polyps Neg Hx     Social History   Socioeconomic History   Marital status: Single    Spouse name: Not on file   Number of children: Not on file   Years of education: Not on file   Highest education level: Not on file  Occupational History   Not on file  Tobacco Use   Smoking status: Every Day    Packs/day: 0.50    Years: 25.00  Total pack years: 12.50    Types: Cigarettes   Smokeless tobacco: Never  Vaping Use   Vaping Use: Never used  Substance and Sexual Activity   Alcohol use: No   Drug use: No   Sexual activity: Yes  Other Topics Concern   Not on file  Social History Narrative   Not on file   Social Determinants of Health   Financial Resource Strain: Not on file  Food Insecurity: Not on file  Transportation Needs: Not on file  Physical Activity: Not on file  Stress: Not on file  Social Connections: Not on file    Review of Systems: Gen: Denies fever, chills, cold or flulike symptoms, lightheadedness, dizziness, presyncope, syncope. CV: Denies chest pain, palpitations. Resp: Denies dyspnea, cough. GI: See HPI Heme: See HPI  Physical Exam: There were no vitals taken for this visit. General:   Alert and oriented. No distress noted. Pleasant and cooperative.  Head:  Normocephalic and atraumatic. Eyes:  Conjuctiva clear without scleral icterus. Heart:  S1, S2 present without murmurs appreciated. Lungs:  Clear to auscultation  bilaterally. No wheezes, rales, or rhonchi. No distress.  Abdomen:  +BS, soft, non-tender and non-distended. No rebound or guarding. No HSM or masses noted. Msk:  Symmetrical without gross deformities. Normal posture. Extremities:  Without edema. Neurologic:  Alert and  oriented x4 Psych:  Normal mood and affect.    Assessment:     Plan:  ***   Ermalinda Memos, PA-C [facility] 08/03/2021

## 2021-08-02 NOTE — Telephone Encounter (Signed)
LMOM for pt to call office back 

## 2021-08-03 ENCOUNTER — Ambulatory Visit: Payer: Medicaid Other | Admitting: Gastroenterology

## 2021-08-03 NOTE — Telephone Encounter (Signed)
Patient was a no-show to his appointment today.  Misty Stanley, can you please call patient to get him rescheduled ASAP?

## 2021-08-04 ENCOUNTER — Encounter: Payer: Self-pay | Admitting: Internal Medicine

## 2021-08-04 ENCOUNTER — Other Ambulatory Visit: Payer: Self-pay | Admitting: Gastroenterology

## 2021-08-04 DIAGNOSIS — R1010 Upper abdominal pain, unspecified: Secondary | ICD-10-CM

## 2021-08-04 NOTE — Telephone Encounter (Signed)
Noted. I also tried calling patient to have him set up an earlier appointment. Left messages on mobile and home phone.

## 2021-08-15 ENCOUNTER — Ambulatory Visit (INDEPENDENT_AMBULATORY_CARE_PROVIDER_SITE_OTHER): Payer: Medicaid Other

## 2021-08-15 DIAGNOSIS — I5022 Chronic systolic (congestive) heart failure: Secondary | ICD-10-CM

## 2021-08-15 LAB — ECHOCARDIOGRAM COMPLETE
AR max vel: 3.06 cm2
AV Area VTI: 3.42 cm2
AV Area mean vel: 3.1 cm2
AV Mean grad: 3.8 mmHg
AV Peak grad: 8 mmHg
Ao pk vel: 1.42 m/s
Area-P 1/2: 2.99 cm2
Calc EF: 43.2 %
MV M vel: 4.59 m/s
MV Peak grad: 84.1 mmHg
S' Lateral: 4.81 cm
Single Plane A2C EF: 45.5 %
Single Plane A4C EF: 40 %

## 2021-08-16 NOTE — Progress Notes (Deleted)
Referring Provider: Waldon Reining, MD Primary Care Physician:  Waldon Reining, MD Primary GI Physician: Dr. Jena Gauss  No chief complaint on file.   HPI:   Anthony Vaughn is a 51 y.o. male presenting today with a history of GERD, epigastric pain in the setting of NSAIDs previously improved with PPI, recurrent diverticulitis, stenotic, stiff, chronic inflamed sigmoid segment on colonoscopy in January 2021 s/p partial colectomy June 2021 with Dr. Henreitta Leber, chronic toilet tissue hematochezia, presenting today for follow-up of upper abdominal pain, nausea, and rectal bleeding.   Most recent EGD January 2021 for upper abdominal pain with mucosal changes in the stomach suspected to be secondary to NSAIDs (biopsies with chronic gastritis with focal intestinal metaplasia, negative for dysplasia or H. 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   CT A/P with contrast April 2021 with normal-appearing gallbladder and biliary tree.   He reported recurrent upper abdominal pain in September 2022, was off PPI at that time, taking Carafate multiple times a day which improved his symptoms.  He was taking 800 mg ibuprofen daily.  Abdominal exam was benign at that point.  He was started on Nexium 40 mg daily.  He ultimately felt Nexium was causing worsening abdominal pain and will started on omeprazole 40 mg daily and was provided viscous lidocaine as well.  Recommended EGD.  He was scheduled for an EGD in November, but rescheduled to December due to being sick and ultimately no-showed.   He was last seen in our office 04/21/2021 and continued with intermittent daily burning in the epigastric area with associated nausea without vomiting, improved by Carafate.  Occasionally using viscous lidocaine.  Using Zofran for nausea.  He was not taking omeprazole.  Denied any postprandial symptoms and felt eating or drinking actually helped at times.  He did report mild early satiety and tightening of  his abdomen if eating too much.  Denied heartburn or dysphagia.  Still taking ibuprofen 800 mg daily.  Also experienced an episode of rectal bleeding the month prior that was larger in volume than normal.  Stated he felt like he needed to have a bowel movement, but passed blood only.  He had no recurrent large volume bleeding, but continued with chronic intermittent toilet tissue hematochezia about once a month.  Denied constipation on MiraLAX daily.  Noted that he had documented 12 pound weight loss over the last 6 months.  Recommended updating labs, starting omeprazole twice daily, continue Carafate, stop NSAIDs, proceed with EGD and colonoscopy.  Consider CT if procedures unrevealing and weight loss continues.   Labs showed hemoglobin of 12.7 (L), CMP within normal limits, lipase normal.  Recommended iron panel, but this was never completed.   Patient called to report omeprazole was causing abdominal cramping.  He was transitioned to pantoprazole.   He was scheduled for EGD and colonoscopy on 5/18; however, patient no-show for preop.  He reported he was not feeling well and requested to reschedule.  He was weak and having rectal bleeding.  He was instructed to go to the emergency room, does not appear that he went.  We tried to reach him thereafter, but were unsuccessful.   Patient called 6/26 reporting abdominal pain, rectal bleeding, weight loss.  Recommended office visit ASAP to discuss rescheduling procedures. He no showed to his appointment on 6/28.   Today:     NSAIDs:  Past Medical History:  Diagnosis Date   Arthritis    CAD (coronary artery disease)    Chronic  systolic heart failure (HCC)    Diverticulitis    s/p partial colectomy due to stenotic sigmoind colon   GERD (gastroesophageal reflux disease)    HTN (hypertension)    SVT (supraventricular tachycardia) (HCC)     Past Surgical History:  Procedure Laterality Date   BIOPSY  03/04/2019   Procedure: BIOPSY;  Surgeon:  Corbin Ade, MD;  Location: [facility];  Service: Endoscopy;;  gastric   COLONOSCOPY     COLONOSCOPY WITH PROPOFOL N/A 03/04/2019   Surgeon: Corbin Ade, MD;  Diverticulosis in the entire colon with a stenotic, stiff, chronically inflamed sigmoid segment.  Low threshold for surgical consultation.  Grade 1 internal hemorrhoids. Repeat in 10 years.   ESOPHAGOGASTRODUODENOSCOPY (EGD) WITH PROPOFOL N/A 03/04/2019   Surgeon: Corbin Ade, MD;  gastric mucosal changes found to be chronic gastritis and focal intestinal metaplasia negative for H. 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   LAPAROSCOPIC PARTIAL COLECTOMY N/A 07/14/2019   Procedure: LAPAROSCOPIC  LEFT HEMICOLECTOMY;  Surgeon: Lucretia Roers, MD;  Location: [facility];  Service: General;  Laterality: N/A;    Current Outpatient Medications  Medication Sig Dispense Refill   amLODipine (NORVASC) 10 MG tablet Take 1 tablet (10 mg total) by mouth daily. For Blood Pressure 30 tablet 5   aspirin EC 81 MG tablet Take 1 tablet (81 mg total) by mouth daily. Swallow whole. 90 tablet 3   docusate sodium (COLACE) 100 MG capsule Take 100 mg by mouth daily as needed for mild constipation.     lidocaine (XYLOCAINE) 2 % solution place ON back of THE TONGUE AND SWALLOW EVERY 6 HOURS AS NEEDED FOR ABDOMINAL pain 300 mL 1   lisinopril (ZESTRIL) 40 MG tablet TAKE 1 TABLET BY MOUTH EVERY DAY 30 tablet 6   metoprolol succinate (TOPROL-XL) 50 MG 24 hr tablet TAKE 1 TABLET BY MOUTH EVERY DAY - STOP COREG 90 tablet 2   ondansetron (ZOFRAN) 4 MG tablet Take 4 mg by mouth every 8 (eight) hours as needed for nausea or vomiting.     pantoprazole (PROTONIX) 40 MG tablet Take 1 tablet (40 mg total) by mouth 2 (two) times daily before a meal. 60 tablet 3   polyethylene glycol (MIRALAX / GLYCOLAX) 17 g packet Take 17 g by mouth daily.     rosuvastatin (CRESTOR) 5 MG tablet Take 1 tablet (5 mg  total) by mouth daily.     sildenafil (REVATIO) 20 MG tablet Take 20-60 mg by mouth daily as needed for erectile dysfunction.     sucralfate (CARAFATE) 1 g tablet TAKE 1 TABLET BY MOUTH FOUR TIMES DAILY 120 tablet 3   No current facility-administered medications for this visit.    Allergies as of 08/18/2021 - Review Complete 04/21/2021  Allergen Reaction Noted   Other  02/04/2019    Family History  Problem Relation Age of Onset   Lung cancer Father    Colon cancer Neg Hx    Colon polyps Neg Hx     Social History   Socioeconomic History   Marital status: Single    Spouse name: Not on file   Number of children: Not on file   Years of education: Not on file   Highest education level: Not on file  Occupational History   Not on file  Tobacco Use   Smoking status: Every Day    Packs/day: 0.50    Years: 25.00    Total  pack years: 12.50    Types: Cigarettes   Smokeless tobacco: Never  Vaping Use   Vaping Use: Never used  Substance and Sexual Activity   Alcohol use: No   Drug use: No   Sexual activity: Yes  Other Topics Concern   Not on file  Social History Narrative   Not on file   Social Determinants of Health   Financial Resource Strain: Not on file  Food Insecurity: Not on file  Transportation Needs: Not on file  Physical Activity: Not on file  Stress: Not on file  Social Connections: Not on file    Review of Systems: Gen: Denies fever, chills, cold or flu like symptoms, pre-syncope, or syncope.  CV: Denies chest pain, palpitations. Resp: Denies dyspnea at rest, cough. GI: See HPI Heme: See HPI  Physical Exam: There were no vitals taken for this visit. General:   Alert and oriented. No distress noted. Pleasant and cooperative.  Head:  Normocephalic and atraumatic. Eyes:  Conjuctiva clear without scleral icterus. Heart:  S1, S2 present without murmurs appreciated. Lungs:  Clear to auscultation bilaterally. No wheezes, rales, or rhonchi. No distress.   Abdomen:  +BS, soft, non-tender and non-distended. No rebound or guarding. No HSM or masses noted. Msk:  Symmetrical without gross deformities. Normal posture. Extremities:  Without edema. Neurologic:  Alert and  oriented x4 Psych:  Normal mood and affect.    Assessment:    Epigastric abdominal pain:   Rectal bleeding:   Plan:  ***   Ermalinda Memos, PA-C [facility] 08/18/2021

## 2021-08-18 ENCOUNTER — Ambulatory Visit: Payer: Medicaid Other | Admitting: Gastroenterology

## 2021-08-21 ENCOUNTER — Other Ambulatory Visit: Payer: Self-pay | Admitting: Cardiology

## 2021-08-22 ENCOUNTER — Other Ambulatory Visit (HOSPITAL_COMMUNITY): Payer: Medicaid Other

## 2021-08-22 ENCOUNTER — Other Ambulatory Visit: Payer: Medicaid Other

## 2021-08-23 ENCOUNTER — Ambulatory Visit: Payer: Medicaid Other | Admitting: Internal Medicine

## 2021-09-16 ENCOUNTER — Telehealth: Payer: Self-pay | Admitting: *Deleted

## 2021-09-16 ENCOUNTER — Encounter: Payer: Self-pay | Admitting: *Deleted

## 2021-09-16 DIAGNOSIS — Z79899 Other long term (current) drug therapy: Secondary | ICD-10-CM

## 2021-09-16 DIAGNOSIS — I5022 Chronic systolic (congestive) heart failure: Secondary | ICD-10-CM

## 2021-09-16 MED ORDER — SACUBITRIL-VALSARTAN 49-51 MG PO TABS: 1.0000 | ORAL_TABLET | Freq: Two times a day (BID) | ORAL | 6 refills | Status: DC

## 2021-09-16 NOTE — Telephone Encounter (Signed)
Lesle Chris, LPN  05/05/760  2:23 PM EDT Back to Top    Notified, copy to pcp.  New medication sent to [facility].  Lab order ([facility]) mailed to patient as will not be due for 2 weeks.     Lesle Chris, LPN  03/14/3333  4:56 PM EDT     No answer.    Antoine Poche, MD  09/12/2021 12:52 PM EDT     Heart function remains mildly decreased, need to make some med chagnes to try to help strengthen. Can he stop lisinopril, wait 48 hours and then start a new medication called entresto 49/51mg  bid. Needs f/u 3 weeks me or PA for additional med changes. BMET 2 weeks after med change     Dominga Ferry MD

## 2021-09-26 NOTE — Progress Notes (Unsigned)
Referring Provider: Waldon Reining, MD Primary Care Physician:  Waldon Reining, MD Primary GI Physician: Dr. Jena Gauss  No chief complaint on file.   HPI:   Anthony Vaughn is a 51 y.o. male  presenting today with a history of GERD, epigastric pain in the setting of NSAIDs previously improved with PPI, recurrent diverticulitis, stenotic, stiff, chronic inflamed sigmoid segment on colonoscopy in January 2021 s/p partial colectomy June 2021 with Dr. Henreitta Leber, chronic toilet tissue hematochezia, presenting today for follow-up of upper abdominal pain, nausea, rectal bleeding, and weight loss.    Most recent EGD January 2021 for upper abdominal pain with mucosal changes in the stomach suspected to be secondary to NSAIDs (biopsies with chronic gastritis with focal intestinal metaplasia, negative for dysplasia or H. 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  CT A/P with contrast April 2021 with normal-appearing gallbladder and biliary tree.   He reported recurrent upper abdominal pain in September 2022, was off PPI at that time, taking Carafate multiple times a day which improved his symptoms.  He was taking 800 mg ibuprofen daily.  Abdominal exam was benign at that point.  He was started on Nexium 40 mg daily.  He ultimately felt Nexium was causing worsening abdominal pain and was transitioned to omeprazole 40 mg daily and viscous lidocaine as needed.  Recommended EGD. He was scheduled for an EGD in November, but rescheduled to December due to being sick and ultimately no-showed.  He was last seen in our office 04/21/2021 and continued with intermittent daily burning in the epigastric area with associated nausea without vomiting, improved by Carafate.  Occasionally using viscous lidocaine.  Using Zofran for nausea.  He was not taking omeprazole.  Denied any postprandial symptoms and felt eating or drinking actually helped at times.  He did report mild early satiety and  tightening of his abdomen if eating too much.  Denied heartburn or dysphagia.  Still taking ibuprofen 800 mg daily.  Also experienced an episode of rectal bleeding the month prior that was larger in volume than normal.  Stated he felt like he needed to have a bowel movement, but passed blood only.  He had no recurrent large volume bleeding, but continued with chronic intermittent toilet tissue hematochezia about once a month.  Denied constipation on MiraLAX daily.  Noted that he had documented 12 pound weight loss over the last 6 months.  Recommended updating labs, starting omeprazole twice daily, continue Carafate, stop NSAIDs, proceed with EGD and colonoscopy.  Consider CT if procedures unrevealing and weight loss continues.   Labs showed hemoglobin of 12.7 (L), CMP within normal limits, lipase normal.  Recommended iron panel, but this was never completed.  Patient called to report omeprazole was causing abdominal cramping.  He was transitioned to pantoprazole.   He was scheduled for EGD and colonoscopy on 5/18; however, patient no-show for preop.  He reported he was not feeling well and requested to reschedule.  He was weak and having rectal bleeding.  He was instructed to go to the emergency room, does not appear that he went.  We tried to reach him thereafter, but were unsuccessful.   Patient called 6/26 reporting abdominal pain, rectal bleeding, weight loss.  Recommended office visit ASAP to discuss rescheduling procedures. He no showed to 2 appointments and has canceled their appointment.  Today:  Epigastric pain/nausea:   Rectal bleeding:   Weight loss:   Past Medical History:  Diagnosis Date   Arthritis    CAD (  coronary artery disease)    Chronic systolic heart failure (HCC)    Diverticulitis    s/p partial colectomy due to stenotic sigmoind colon   GERD (gastroesophageal reflux disease)    HTN (hypertension)    SVT (supraventricular tachycardia) (HCC)     Past Surgical  History:  Procedure Laterality Date   BIOPSY  03/04/2019   Procedure: BIOPSY;  Surgeon: Corbin Ade, MD;  [facility]: [facility];  Service: Endoscopy;;  gastric   COLONOSCOPY     COLONOSCOPY WITH PROPOFOL N/A 03/04/2019   Surgeon: Corbin Ade, MD;  Diverticulosis in the entire colon with a stenotic, stiff, chronically inflamed sigmoid segment.  Low threshold for surgical consultation.  Grade 1 internal hemorrhoids. Repeat in 10 years.   ESOPHAGOGASTRODUODENOSCOPY (EGD) WITH PROPOFOL N/A 03/04/2019   Surgeon: Corbin Ade, MD;  gastric mucosal changes found to be chronic gastritis and focal intestinal metaplasia negative for H. 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   LAPAROSCOPIC PARTIAL COLECTOMY N/A 07/14/2019   Procedure: LAPAROSCOPIC  LEFT HEMICOLECTOMY;  Surgeon: Lucretia Roers, MD;  Location: [facility];  Service: General;  Laterality: N/A;    Current Outpatient Medications  Medication Sig Dispense Refill   amLODipine (NORVASC) 10 MG tablet Take 1 tablet (10 mg total) by mouth daily. For Blood Pressure 30 tablet 5   aspirin EC 81 MG tablet Take 1 tablet (81 mg total) by mouth daily. Swallow whole. 90 tablet 3   docusate sodium (COLACE) 100 MG capsule Take 100 mg by mouth daily as needed for mild constipation.     lidocaine (XYLOCAINE) 2 % solution place ON back of THE TONGUE AND SWALLOW EVERY 6 HOURS AS NEEDED FOR ABDOMINAL pain 300 mL 1   metoprolol succinate (TOPROL-XL) 50 MG 24 hr tablet TAKE 1 TABLET BY MOUTH EVERY DAY - STOP COREG 90 tablet 2   ondansetron (ZOFRAN) 4 MG tablet Take 4 mg by mouth every 8 (eight) hours as needed for nausea or vomiting.     pantoprazole (PROTONIX) 40 MG tablet Take 1 tablet (40 mg total) by mouth 2 (two) times daily before a meal. 60 tablet 3   polyethylene glycol (MIRALAX / GLYCOLAX) 17 g packet Take 17 g by mouth daily.     rosuvastatin (CRESTOR) 5 MG tablet Take 1 tablet  (5 mg total) by mouth daily.     sacubitril-valsartan (ENTRESTO) 49-51 MG Take 1 tablet by mouth 2 (two) times daily. 60 tablet 6   sildenafil (REVATIO) 20 MG tablet Take 20-60 mg by mouth daily as needed for erectile dysfunction.     sucralfate (CARAFATE) 1 g tablet TAKE 1 TABLET BY MOUTH FOUR TIMES DAILY 120 tablet 3   No current facility-administered medications for this visit.    Allergies as of 09/28/2021 - Review Complete 04/21/2021  Allergen Reaction Noted   Other  02/04/2019    Family History  Problem Relation Age of Onset   Lung cancer Father    Colon cancer Neg Hx    Colon polyps Neg Hx     Social History   Socioeconomic History   Marital status: Single    Spouse name: Not on file   Number of children: Not on file   Years of education: Not on file   Highest education level: Not on file  Occupational History   Not on file  Tobacco Use   Smoking status: Every Day    Packs/day: 0.50  Years: 25.00    Total pack years: 12.50    Types: Cigarettes   Smokeless tobacco: Never  Vaping Use   Vaping Use: Never used  Substance and Sexual Activity   Alcohol use: No   Drug use: No   Sexual activity: Yes  Other Topics Concern   Not on file  Social History Narrative   Not on file   Social Determinants of Health   Financial Resource Strain: Not on file  Food Insecurity: Not on file  Transportation Needs: Not on file  Physical Activity: Not on file  Stress: Not on file  Social Connections: Not on file    Review of Systems: Gen: Denies fever, chills, cold or flulike symptoms, presyncope, syncope. CV: Denies chest pain, palpitations. Resp: Denies dyspnea, cough.  GI: See HPI Heme: See HPI  Physical Exam: There were no vitals taken for this visit. General:   Alert and oriented. No distress noted. Pleasant and cooperative.  Head:  Normocephalic and atraumatic. Eyes:  Conjuctiva clear without scleral icterus. Heart:  S1, S2 present without murmurs  appreciated. Lungs:  Clear to auscultation bilaterally. No wheezes, rales, or rhonchi. No distress.  Abdomen:  +BS, soft, non-tender and non-distended. No rebound or guarding. No HSM or masses noted. Msk:  Symmetrical without gross deformities. Normal posture. Extremities:  Without edema. Neurologic:  Alert and  oriented x4 Psych:  Normal mood and affect.    Assessment:     Plan:  ***   Ermalinda Memos, PA-C [facility] 09/28/2021

## 2021-09-27 ENCOUNTER — Encounter: Payer: Self-pay | Admitting: *Deleted

## 2021-09-28 ENCOUNTER — Ambulatory Visit (INDEPENDENT_AMBULATORY_CARE_PROVIDER_SITE_OTHER): Payer: Medicaid Other | Admitting: Gastroenterology

## 2021-09-28 ENCOUNTER — Encounter: Payer: Self-pay | Admitting: Gastroenterology

## 2021-09-28 VITALS — BP 173/69 | HR 65 | Temp 98.0°F | Ht 72.0 in | Wt 159.4 lb

## 2021-09-28 DIAGNOSIS — K625 Hemorrhage of anus and rectum: Secondary | ICD-10-CM

## 2021-09-28 DIAGNOSIS — R1010 Upper abdominal pain, unspecified: Secondary | ICD-10-CM

## 2021-09-28 DIAGNOSIS — R112 Nausea with vomiting, unspecified: Secondary | ICD-10-CM

## 2021-09-28 DIAGNOSIS — R1310 Dysphagia, unspecified: Secondary | ICD-10-CM

## 2021-09-28 DIAGNOSIS — R1013 Epigastric pain: Secondary | ICD-10-CM

## 2021-09-28 DIAGNOSIS — K219 Gastro-esophageal reflux disease without esophagitis: Secondary | ICD-10-CM

## 2021-09-28 DIAGNOSIS — D649 Anemia, unspecified: Secondary | ICD-10-CM

## 2021-09-28 MED ORDER — SUCRALFATE 1 G PO TABS: 1.0000 g | ORAL_TABLET | Freq: Four times a day (QID) | ORAL | 3 refills | Status: DC

## 2021-09-28 MED ORDER — PANTOPRAZOLE SODIUM 40 MG PO TBEC: 40.0000 mg | DELAYED_RELEASE_TABLET | Freq: Two times a day (BID) | ORAL | 3 refills | Status: DC

## 2021-09-28 NOTE — Patient Instructions (Signed)
We will arrange for you to have an upper endoscopy with possible stretching of your esophagus and colonoscopy in the near future with Dr. Jena Gauss. Hold iron for 7 days prior to your procedure.  Increase pantoprazole to 40 mg twice daily 30 minutes before breakfast and dinner.  Continue Carafate up to 4 times daily as needed.  Continue Zofran 4 mg every 8 hours as needed.  We will follow-up with you in the office after your procedures.  As we discussed, it is very important that you do not cancel these procedures so we can get to the bottom of both causing your symptoms.  It was good to see you again today!   Ermalinda Memos, PA-C [facility]

## 2021-09-29 ENCOUNTER — Other Ambulatory Visit: Payer: Self-pay | Admitting: *Deleted

## 2021-09-29 ENCOUNTER — Telehealth: Payer: Self-pay | Admitting: Gastroenterology

## 2021-09-29 MED ORDER — SACUBITRIL-VALSARTAN 49-51 MG PO TABS
1.0000 | ORAL_TABLET | Freq: Two times a day (BID) | ORAL | 0 refills | Status: DC
Start: 2021-09-29 — End: 2021-12-07

## 2021-09-29 NOTE — Telephone Encounter (Signed)
Received and reviewed labs from PCP dated 08/08/2021. CBC: WBC 3.2 (L), hemoglobin 12.3 (L), MCV 87.4, MCH 28.2, MCHC 32.3, platelet count 258 Iron panel: Total iron 42 (L), ferritin 6 (L), saturation 13% (L) Renal function panel: Glucose 74, creatinine 1.3 (H), sodium 141, BUN 16, potassium 4.9, chloride 108, calcium 9.9, albumin 3.7 TSH 0.68, free T41.8, T4 total 5.4  Courtney:  Please let patient know I have reviewed his most recent labs from July. Hemoglobin slightly low at 12.3 and iron was also low as he reported. As he has been on iron for about 1 month now, I would like to go ahead and update his CBC and iron panel to ensure labs are stable/improving.  Please arrange CBC and iron panel. Dx: Anemia.

## 2021-09-30 ENCOUNTER — Other Ambulatory Visit: Payer: Self-pay | Admitting: *Deleted

## 2021-09-30 DIAGNOSIS — D649 Anemia, unspecified: Secondary | ICD-10-CM

## 2021-09-30 NOTE — Telephone Encounter (Signed)
Spoke to pt, informed him of results and recommendations. Pt voiced understanding. Informed him that he needed more labs done and he could go to [facility] next week to have them done. Labs entered into Epic.

## 2021-09-30 NOTE — Telephone Encounter (Signed)
Noted  

## 2021-10-07 ENCOUNTER — Encounter: Payer: Self-pay | Admitting: *Deleted

## 2021-10-17 ENCOUNTER — Telehealth: Payer: Self-pay | Admitting: *Deleted

## 2021-10-17 ENCOUNTER — Encounter: Payer: Self-pay | Admitting: *Deleted

## 2021-10-17 MED ORDER — PEG 3350-KCL-NA BICARB-NACL 420 G PO SOLR: 4000.0000 mL | Freq: Once | ORAL | 0 refills | Status: AC

## 2021-10-17 NOTE — Telephone Encounter (Signed)
Called pt, LMOVM to call back to schedule TCS/EGD +/-ED with Dr. Jena Gauss, ASA 3

## 2021-10-18 ENCOUNTER — Encounter: Payer: Self-pay | Admitting: *Deleted

## 2021-10-20 NOTE — Progress Notes (Unsigned)
Clinical Summary Mr. Flemmer is a 51 y.o.male  seen today as a new patient for the following medical problems.      1. Palpitations/SVT - ER visit yesterday with palpitations, nausea, severe SOB - from notes EMS found him to be in SVT, given rounds of adenosine without improvement - ER notes mention EKG with SVT rate 200 - developed some hypotension, required cardioversion -after cardioversion discussions for transfer to [facility] main campus but patient signed out AMA  - Free T4 0.79 TSH 1.18 Mg 1.6 K 3.7 Cr 1.39 Hstrop 131-->279     - coreg caused SOB and fatigue, toprol much better tolerated.  -infrequent palpitations.  - compliant with toprol. Has made significant improvement in symptoms with toprol       2. HTN - admitted 07/2019 with severe HTN, started on cardene drip - he is compliant with meds     3. Chronic systolic HF - echo during admission LVEF 40-45%, inferior wall akinetic, basal inferolateral and apical inferior are hypokinetic.      - no recent edema     4. History of diverticulitis - recent hemicolectomy 07/2019   5. Presumed CAD - 08/2019 nuclear stress: large iferior infarct, no ischemia - 07/2019 echo inferior wall is akinetic.  - no recent symptoms   6. Hyperlipidemia - changed to crestor 5mg  daily Jan 2023 - side effects on lipitor, stomach discomfort - he is not 100% sure he started the crestor   7. Abdominal pain/Nausea - followed GI Past Medical History:  Diagnosis Date   Arthritis    CAD (coronary artery disease)    Chronic systolic heart failure (HCC)    Diverticulitis    s/p partial colectomy due to stenotic sigmoind colon   GERD (gastroesophageal reflux disease)    HTN (hypertension)    SVT (supraventricular tachycardia) (HCC)      Allergies  Allergen Reactions   Other     States he's allergic to a blood pressure pill that causes abdominal pain. He's not sure name of medication     Current Outpatient Medications   Medication Sig Dispense Refill   amLODipine (NORVASC) 10 MG tablet Take 1 tablet (10 mg total) by mouth daily. For Blood Pressure 30 tablet 5   aspirin EC 81 MG tablet Take 1 tablet (81 mg total) by mouth daily. Swallow whole. 90 tablet 3   docusate sodium (COLACE) 100 MG capsule Take 100 mg by mouth daily as needed for mild constipation.     lidocaine (XYLOCAINE) 2 % solution place Feb 2023 ON back of THE TONGUE AND SWALLOW EVERY 6 HOURS AS NEEDED FOR ABDOMINAL pain 300 mL 1   metoprolol succinate (TOPROL-XL) 50 MG 24 hr tablet TAKE 1 TABLET BY MOUTH EVERY DAY - STOP COREG 90 tablet 2   ondansetron (ZOFRAN) 4 MG tablet Take 4 mg by mouth every 8 (eight) hours as needed for nausea or vomiting.     pantoprazole (PROTONIX) 40 MG tablet Take 1 tablet (40 mg total) by mouth 2 (two) times daily before a meal. 60 tablet 3   polyethylene glycol (MIRALAX / GLYCOLAX) 17 g packet Take 17 g by mouth daily.     rosuvastatin (CRESTOR) 5 MG tablet Take 1 tablet (5 mg total) by mouth daily.     sacubitril-valsartan (ENTRESTO) 49-51 MG Take 1 tablet by mouth 2 (two) times daily. 28 tablet 0   sildenafil (REVATIO) 20 MG tablet Take 20-60 mg by mouth daily as needed for erectile  dysfunction.     sucralfate (CARAFATE) 1 g tablet Take 1 tablet (1 g total) by mouth 4 (four) times daily. 120 tablet 3   No current facility-administered medications for this visit.     Past Surgical History:  Procedure Laterality Date   BIOPSY  03/04/2019   Procedure: BIOPSY;  Surgeon: Daneil Dolin, MD;  Location: [facility];  Service: Endoscopy;;  gastric   COLONOSCOPY     COLONOSCOPY WITH PROPOFOL N/A 03/04/2019   Surgeon: Daneil Dolin, MD;  Diverticulosis in the entire colon with a stenotic, stiff, chronically inflamed sigmoid segment.  Low threshold for surgical consultation.  Grade 1 internal hemorrhoids. Repeat in 10 years.   ESOPHAGOGASTRODUODENOSCOPY (EGD) WITH PROPOFOL N/A 03/04/2019   Surgeon: Daneil Dolin, MD;   gastric mucosal changes found to be chronic gastritis and focal intestinal metaplasia negative for H. 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   LAPAROSCOPIC PARTIAL COLECTOMY N/A 07/14/2019   Procedure: LAPAROSCOPIC  LEFT HEMICOLECTOMY;  Surgeon: Virl Cagey, MD;  Location: [facility];  Service: General;  Laterality: N/A;     Allergies  Allergen Reactions   Other     States he's allergic to a blood pressure pill that causes abdominal pain. He's not sure name of medication      Family History  Problem Relation Age of Onset   Lung cancer Father    Colon cancer Neg Hx    Colon polyps Neg Hx      Social History Mr. Leto reports that he has been smoking cigarettes. He has a 12.50 pack-year smoking history. He has never used smokeless tobacco. Mr. Kuznia reports no history of alcohol use.   Review of Systems CONSTITUTIONAL: No weight loss, fever, chills, weakness or fatigue.  HEENT: Eyes: No visual loss, blurred vision, double vision or yellow sclerae.No hearing loss, sneezing, congestion, runny nose or sore throat.  SKIN: No rash or itching.  CARDIOVASCULAR:  RESPIRATORY: No shortness of breath, cough or sputum.  GASTROINTESTINAL: No anorexia, nausea, vomiting or diarrhea. No abdominal pain or blood.  GENITOURINARY: No burning on urination, no polyuria NEUROLOGICAL: No headache, dizziness, syncope, paralysis, ataxia, numbness or tingling in the extremities. No change in bowel or bladder control.  MUSCULOSKELETAL: No muscle, back pain, joint pain or stiffness.  LYMPHATICS: No enlarged nodes. No history of splenectomy.  PSYCHIATRIC: No history of depression or anxiety.  ENDOCRINOLOGIC: No reports of sweating, cold or heat intolerance. No polyuria or polydipsia.  Marland Kitchen   Physical Examination There were no vitals filed for this visit. There were no vitals filed for this visit.  Gen: resting comfortably, no acute  distress HEENT: no scleral icterus, pupils equal round and reactive, no palptable cervical adenopathy,  CV Resp: Clear to auscultation bilaterally GI: abdomen is soft, non-tender, non-distended, normal bowel sounds, no hepatosplenomegaly MSK: extremities are warm, no edema.  Skin: warm, no rash Neuro:  no focal deficits Psych: appropriate affect   Diagnostic Studies  08/2019 nuclear stress There was no ST segment deviation noted during stress. Findings consistent with large prior inferior/inferoseptal myocardial infarction. This is a high risk study. Risk is based on decreased LVEF, there is prior infarct without myocardium currently at jeopardy. Consider correlating LVEF with echo to better assess risk. The left ventricular ejection fraction is moderately decreased (30%).   08/2021 echo 1. Left ventricular ejection fraction, by estimation, is 40 to 45%. The  left ventricle has mildly decreased function. The left  ventricle  demonstrates global hypokinesis. There is moderate left ventricular  hypertrophy. Left ventricular diastolic  parameters are consistent with Grade I diastolic dysfunction (impaired  relaxation). The average left ventricular global longitudinal strain is  -13.3 %. The global longitudinal strain is abnormal.   2. Right ventricular systolic function is normal. The right ventricular  size is normal. Tricuspid regurgitation signal is inadequate for assessing  PA pressure.   3. Left atrial size was mildly dilated.   4. The mitral valve is abnormal. Mild mitral valve regurgitation. No  evidence of mitral stenosis.   5. The aortic valve is tricuspid. Aortic valve regurgitation is not  visualized. No aortic stenosis is present.   6. Aortic dilatation noted. There is mild dilatation of the aortic root,  measuring 40 mm.   7. The inferior vena cava is normal in size with greater than 50%  respiratory variability, suggesting right atrial pressure of 3 mmHg.      Assessment and Plan    SVT - new diagnosis during recent ER admission, converted with cardioversion -overall doing well on toprol. Very rare episodes, if more frequent could add on prn lopressor. Low HRs would not increase his daily toprol   2. Chronic systolic HF - mild LV systolic dysfunction by echo - EKG, echo, nuclear stress  would suggest potential prior inferior infarct - we will repeat echo. If ongoing dysfunction change ACEi to entresto, consdier aldactone and SGLT2i   3. Probable CAD - based on imaging - no recent symptoms, continue current meds   4. Hyperlipidemia - side effects on very low dose lipitor, was to change to crestor 5mg  daily. He is not sure if he started crestor or not, we will contact his pharmacy    , M.D., F.A.C.C.

## 2021-10-21 ENCOUNTER — Ambulatory Visit: Payer: Medicaid Other | Attending: Cardiology | Admitting: Cardiology

## 2021-10-21 ENCOUNTER — Encounter: Payer: Self-pay | Admitting: Cardiology

## 2021-10-21 VITALS — BP 138/96 | HR 66 | Ht 72.0 in | Wt 158.4 lb

## 2021-10-21 DIAGNOSIS — I5022 Chronic systolic (congestive) heart failure: Secondary | ICD-10-CM | POA: Diagnosis not present

## 2021-10-21 DIAGNOSIS — E782 Mixed hyperlipidemia: Secondary | ICD-10-CM | POA: Diagnosis not present

## 2021-10-21 DIAGNOSIS — I2510 Atherosclerotic heart disease of native coronary artery without angina pectoris: Secondary | ICD-10-CM

## 2021-10-21 DIAGNOSIS — I471 Supraventricular tachycardia: Secondary | ICD-10-CM

## 2021-10-21 MED ORDER — SPIRONOLACTONE 25 MG PO TABS: 12.5000 mg | ORAL_TABLET | Freq: Every day | ORAL | 6 refills | Status: DC

## 2021-10-21 MED ORDER — METOPROLOL SUCCINATE ER 50 MG PO TB24: 75.0000 mg | ORAL_TABLET | Freq: Every day | ORAL | 1 refills | Status: DC

## 2021-10-21 NOTE — Patient Instructions (Addendum)
Medication Instructions:  Begin Aldactone 12.5mg  daily Increase Toprol XL to 75mg  daily  Continue your Crestor at 10mg  daily  Continue all other medications.     Labwork: none  Testing/Procedures: none  Follow-Up: 6 weeks   Any Other Special Instructions Will Be Listed Below (If Applicable).   If you need a refill on your cardiac medications before your next appointment, please call your pharmacy. Begin

## 2021-10-26 ENCOUNTER — Encounter: Payer: Self-pay | Admitting: *Deleted

## 2021-10-31 NOTE — Telephone Encounter (Signed)
Lmovm to call back. Letter mailed 

## 2021-11-01 ENCOUNTER — Encounter: Payer: Self-pay | Admitting: Cardiology

## 2021-11-02 ENCOUNTER — Other Ambulatory Visit: Payer: Self-pay | Admitting: Gastroenterology

## 2021-11-03 ENCOUNTER — Telehealth: Payer: Self-pay | Admitting: *Deleted

## 2021-11-03 NOTE — Telephone Encounter (Addendum)
Is this the same pain he has been dealing with with I saw him in the office? Has anything changed? Any fever?   Please verify that he is taking pantoprazole 40 mg twice daily every day along with carafate 4 times daily? Is he using viscous lidocaine? If so, how often?   Is he taking any NSAID products such as ibuprofen, Aleve, Advil, BC powders, Goody powders?

## 2021-11-03 NOTE — Telephone Encounter (Signed)
Spoke to pt, he states he has been taking his medications as prescribed. He has not had a fever and the pain is the same as his last OV. He states he was taking 2 iron pills daily that his PCP had prescribed. But when to 1 a day, because he noticed more stomach pains. He states he in not taking any NSAID.

## 2021-11-03 NOTE — Telephone Encounter (Signed)
Pt called and states that he is having sever stomach pains. Not constipated, no diarrhea. Taking medications as prescribed. Please advise.

## 2021-11-03 NOTE — Telephone Encounter (Signed)
Please ask how often he is using viscous lidocaine.   Unfortunately, I do not have much else to offer in regards to medication management right now. Iron can cause some increased GI upset. He can try a different formulation such as iron gummies over the counter or slow-FE over the counter.   We can go ahead with an [place] of his gallbladder to make sure this isn't causing any obvious issues for him.

## 2021-11-04 ENCOUNTER — Other Ambulatory Visit: Payer: Self-pay | Admitting: Gastroenterology

## 2021-11-04 ENCOUNTER — Other Ambulatory Visit: Payer: Self-pay | Admitting: *Deleted

## 2021-11-04 DIAGNOSIS — R112 Nausea with vomiting, unspecified: Secondary | ICD-10-CM

## 2021-11-04 DIAGNOSIS — R1010 Upper abdominal pain, unspecified: Secondary | ICD-10-CM

## 2021-11-04 MED ORDER — LIDOCAINE VISCOUS HCL 2 % MT SOLN: OROMUCOSAL | 1 refills | Status: DC

## 2021-11-04 NOTE — Telephone Encounter (Signed)
Spoke to pt, he informed me that he has not been taking viscous lidocaine. He needs another prescriptions sent to the pharmacy. He would like to go ahead with [place] of gallbladder. Informed to try iron gummies over the counter.

## 2021-11-04 NOTE — Telephone Encounter (Signed)
Noted  

## 2021-11-04 NOTE — Telephone Encounter (Signed)
Rx for viscous lidocaine has been sent to his pharmacy.  Mindy/Tammy: Please arrange RUQ ultrasound. DX: Upper abdominal pain, nausea, vomiting

## 2021-11-07 ENCOUNTER — Other Ambulatory Visit: Payer: Self-pay | Admitting: *Deleted

## 2021-11-07 DIAGNOSIS — R112 Nausea with vomiting, unspecified: Secondary | ICD-10-CM

## 2021-11-07 DIAGNOSIS — R1010 Upper abdominal pain, unspecified: Secondary | ICD-10-CM

## 2021-11-07 NOTE — Telephone Encounter (Signed)
Pt informed us scheduled for 11/11/21 at 9:30 am, arrive at 9:15 am, nothing to eat or drink after midnight. Verbalized understanding.

## 2021-11-11 ENCOUNTER — Other Ambulatory Visit: Payer: Self-pay | Admitting: *Deleted

## 2021-11-11 ENCOUNTER — Ambulatory Visit (HOSPITAL_COMMUNITY)
Admission: RE | Admit: 2021-11-11 | Discharge: 2021-11-11 | Disposition: A | Discharge status: Home / Self Care | Payer: Medicaid Other | Source: Ambulatory Visit | Attending: Gastroenterology | Admitting: Gastroenterology

## 2021-11-11 DIAGNOSIS — R1010 Upper abdominal pain, unspecified: Secondary | ICD-10-CM | POA: Insufficient documentation

## 2021-11-11 DIAGNOSIS — R112 Nausea with vomiting, unspecified: Secondary | ICD-10-CM | POA: Insufficient documentation

## 2021-11-14 ENCOUNTER — Other Ambulatory Visit: Payer: Self-pay | Admitting: *Deleted

## 2021-11-14 DIAGNOSIS — N289 Disorder of kidney and ureter, unspecified: Secondary | ICD-10-CM

## 2021-11-16 ENCOUNTER — Other Ambulatory Visit: Payer: Self-pay | Admitting: Gastroenterology

## 2021-11-16 DIAGNOSIS — R1010 Upper abdominal pain, unspecified: Secondary | ICD-10-CM

## 2021-11-16 MED ORDER — SUCRALFATE 1 GM/10ML PO SUSP: 1.0000 g | Freq: Four times a day (QID) | ORAL | 5 refills | Status: DC

## 2021-11-28 NOTE — Patient Instructions (Signed)
Anthony Vaughn  11/28/2021     @PREFPERIOPPHARMACY @   Your procedure is scheduled on  12/01/2021.   Report to 12/03/2021 at  0915  A.M.   Call this number if you have problems the morning of surgery:  [phone]  If you experience any cold or flu symptoms such as cough, fever, chills, shortness of breath, etc. between now and your scheduled surgery, please notify [phone] at the above number.   Remember:  Follow the diet and prep instructions given to you by the office.      Take these medicines the morning of surgery with A SIP OF WATER          amlodipine, metoprolol, zofran (if needed), protonix, entresto.     Do not wear jewelry, make-up or nail polish.  Do not wear lotions, powders, or perfumes, or deodorant.  Do not shave 48 hours prior to surgery.  Men may shave face and neck.  Do not bring valuables to the hospital.  [facility] is not responsible for any belongings or valuables.  Contacts, dentures or bridgework may not be worn into surgery.  Leave your suitcase in the car.  After surgery it may be brought to your room.  For patients admitted to the hospital, discharge time will be determined by your treatment team.  Patients discharged the day of surgery will not be allowed to drive home and must have someone with them for 24 hours.    Special instructions:   DO NOT smoke tobacco or vape for 24 hours before your procedure.  Please read over the following fact sheets that you were given. Anesthesia Post-op Instructions and Care and Recovery After Surgery      Upper Endoscopy, Adult, Care After After the procedure, it is common to have a sore throat. It is also common to have: Mild stomach pain or discomfort. Bloating. Nausea. Follow these instructions at home: The instructions below may help you care for yourself at home. Your health care provider may give you more instructions. If you have questions, ask your health care provider. If you  were given a sedative during the procedure, it can affect you for several hours. Do not drive or operate machinery until your health care provider says that it is safe. If you will be going home right after the procedure, plan to have a responsible adult: Take you home from the hospital or clinic. You will not be allowed to drive. Care for you for the time you are told. Follow instructions from your health care provider about what you may eat and drink. Return to your normal activities as told by your health care provider. Ask your health care provider what activities are safe for you. Take over-the-counter and prescription medicines only as told by your health care provider. Contact a health care provider if you: Have a sore throat that lasts longer than one day. Have trouble swallowing. Have a fever. Get help right away if you: Vomit blood or your vomit looks like coffee grounds. Have bloody, black, or tarry stools. Have a very bad sore throat or you cannot swallow. Have difficulty breathing or very bad pain in your chest or abdomen. These symptoms may be an emergency. Get help right away. Call 911. Do not wait to see if the symptoms will go away. Do not drive yourself to the hospital. Summary After the procedure, it is common to have a sore throat, mild stomach discomfort,  bloating, and nausea. If you were given a sedative during the procedure, it can affect you for several hours. Do not drive until your health care provider says that it is safe. Follow instructions from your health care provider about what you may eat and drink. Return to your normal activities as told by your health care provider. This information is not intended to replace advice given to you by your health care provider. Make sure you discuss any questions you have with your health care provider. Document Revised: 05/04/2021 Document Reviewed: 05/04/2021 Elsevier Patient Education  2023 [employer] Esophageal  Dilatation Esophageal dilatation, also called esophageal dilation, is a procedure to widen or open a blocked or narrowed part of the esophagus. The esophagus is the part of the body that moves food and liquid from the mouth to the stomach. You may need this procedure if: You have a buildup of scar tissue in your esophagus that makes it difficult, painful, or impossible to swallow. This can be caused by gastroesophageal reflux disease (GERD). You have cancer of the esophagus. There is a problem with how food moves through your esophagus. In some cases, you may need this procedure repeated at a later time to dilate the esophagus gradually. Tell a health care provider about: Any allergies you have. All medicines you are taking, including vitamins, herbs, eye drops, creams, and over-the-counter medicines. Any problems you or family members have had with anesthetic medicines. Any blood disorders you have. Any surgeries you have had. Any medical conditions you have. Any antibiotic medicines you are required to take before dental procedures. Whether you are pregnant or may be pregnant. What are the risks? Generally, this is a safe procedure. However, problems may occur, including: Bleeding due to a tear in the lining of the esophagus. A hole, or perforation, in the esophagus. What happens before the procedure? Ask your health care provider about: Changing or stopping your regular medicines. This is especially important if you are taking diabetes medicines or blood thinners. Taking medicines such as aspirin and ibuprofen. These medicines can thin your blood. Do not take these medicines unless your health care provider tells you to take them. Taking over-the-counter medicines, vitamins, herbs, and supplements. Follow instructions from your health care provider about eating or drinking restrictions. Plan to have a responsible adult take you home from the hospital or clinic. Plan to have a responsible  adult care for you for the time you are told after you leave the hospital or clinic. This is important. What happens during the procedure? You may be given a medicine to help you relax (sedative). A numbing medicine may be sprayed into the back of your throat, or you may gargle the medicine. Your health care provider may perform the dilatation using various surgical instruments, such as: Simple dilators. This instrument is carefully placed in the esophagus to stretch it. Guided wire bougies. This involves using an endoscope to insert a wire into the esophagus. A dilator is passed over this wire to enlarge the esophagus. Then the wire is removed. Balloon dilators. An endoscope with a small balloon is inserted into the esophagus. The balloon is inflated to stretch the esophagus and open it up. The procedure may vary among health care providers and hospitals. What can I expect after the procedure? Your blood pressure, heart rate, breathing rate, and blood oxygen level will be monitored until you leave the hospital or clinic. Your throat may feel slightly sore and numb. This will get better over time.  You will not be allowed to eat or drink until your throat is no longer numb. When you are able to drink, urinate, and sit on the edge of the bed without nausea or dizziness, you may be able to return home. Follow these instructions at home: Take over-the-counter and prescription medicines only as told by your health care provider. If you were given a sedative during the procedure, it can affect you for several hours. Do not drive or operate machinery until your health care provider says that it is safe. Plan to have a responsible adult care for you for the time you are told. This is important. Follow instructions from your health care provider about any eating or drinking restrictions. Do not use any products that contain nicotine or tobacco, such as cigarettes, e-cigarettes, and chewing tobacco. If you  need help quitting, ask your health care provider. Keep all follow-up visits. This is important. Contact a health care provider if: You have a fever. You have pain that is not relieved by medicine. Get help right away if: You have chest pain. You have trouble breathing. You have trouble swallowing. You vomit blood. You have black, tarry, or bloody stools. These symptoms may represent a serious problem that is an emergency. Do not wait to see if the symptoms will go away. Get medical help right away. Call your local emergency services (911 in the [place] Do not drive yourself to the hospital. Summary Esophageal dilatation, also called esophageal dilation, is a procedure to widen or open a blocked or narrowed part of the esophagus. Plan to have a responsible adult take you home from the hospital or clinic. For this procedure, a numbing medicine may be sprayed into the back of your throat, or you may gargle the medicine. Do not drive or operate machinery until your health care provider says that it is safe. This information is not intended to replace advice given to you by your health care provider. Make sure you discuss any questions you have with your health care provider. Document Revised: 06/11/2019 Document Reviewed: 06/11/2019 Elsevier Patient Education  2023 [employer]. Colonoscopy, Adult, Care After The following information offers guidance on how to care for yourself after your procedure. Your health care provider may also give you more specific instructions. If you have problems or questions, contact your health care provider. What can I expect after the procedure? After the procedure, it is common to have: A small amount of blood in your stool for 24 hours after the procedure. Some gas. Mild cramping or bloating of your abdomen. Follow these instructions at home: Eating and drinking  Drink enough fluid to keep your urine pale yellow. Follow instructions from your health care  provider about eating or drinking restrictions. Resume your normal diet as told by your health care provider. Avoid heavy or fried foods that are hard to digest. Activity Rest as told by your health care provider. Avoid sitting for a long time without moving. Get up to take short walks every 1-2 hours. This is important to improve blood flow and breathing. Ask for help if you feel weak or unsteady. Return to your normal activities as told by your health care provider. Ask your health care provider what activities are safe for you. Managing cramping and bloating  Try walking around when you have cramps or feel bloated. If directed, apply heat to your abdomen as told by your health care provider. Use the heat source that your health care provider recommends, such as  a moist heat pack or a heating pad. Place a towel between your skin and the heat source. Leave the heat on for 20-30 minutes. Remove the heat if your skin turns bright red. This is especially important if you are unable to feel pain, heat, or cold. You have a greater risk of getting burned. General instructions If you were given a sedative during the procedure, it can affect you for several hours. Do not drive or operate machinery until your health care provider says that it is safe. For the first 24 hours after the procedure: Do not sign important documents. Do not drink alcohol. Do your regular daily activities at a slower pace than normal. Eat soft foods that are easy to digest. Take over-the-counter and prescription medicines only as told by your health care provider. Keep all follow-up visits. This is important. Contact a health care provider if: You have blood in your stool 2-3 days after the procedure. Get help right away if: You have more than a small spotting of blood in your stool. You have large blood clots in your stool. You have swelling of your abdomen. You have nausea or vomiting. You have a fever. You have  increasing pain in your abdomen that is not relieved with medicine. These symptoms may be an emergency. Get help right away. Call 911. Do not wait to see if the symptoms will go away. Do not drive yourself to the hospital. Summary After the procedure, it is common to have a small amount of blood in your stool. You may also have mild cramping and bloating of your abdomen. If you were given a sedative during the procedure, it can affect you for several hours. Do not drive or operate machinery until your health care provider says that it is safe. Get help right away if you have a lot of blood in your stool, nausea or vomiting, a fever, or increased pain in your abdomen. This information is not intended to replace advice given to you by your health care provider. Make sure you discuss any questions you have with your health care provider. Document Revised: 09/15/2020 Document Reviewed: 09/15/2020 Elsevier Patient Education  2023 Elsevier Inc. Monitored Anesthesia Care, Care After This sheet gives you information about how to care for yourself after your procedure. Your health care provider may also give you more specific instructions. If you have problems or questions, contact your health care provider. What can I expect after the procedure? After the procedure, it is common to have: Tiredness. Forgetfulness about what happened after the procedure. Impaired judgment for important decisions. Nausea or vomiting. Some difficulty with balance. Follow these instructions at home: For the time period you were told by your health care provider:     Rest as needed. Do not participate in activities where you could fall or become injured. Do not drive or use machinery. Do not drink alcohol. Do not take sleeping pills or medicines that cause drowsiness. Do not make important decisions or sign legal documents. Do not take care of children on your own. Eating and drinking Follow the diet that is  recommended by your health care provider. Drink enough fluid to keep your urine pale yellow. If you vomit: Drink water, juice, or soup when you can drink without vomiting. Make sure you have little or no nausea before eating solid foods. General instructions Have a responsible adult stay with you for the time you are told. It is important to have someone help care for you until  you are awake and alert. Take over-the-counter and prescription medicines only as told by your health care provider. If you have sleep apnea, surgery and certain medicines can increase your risk for breathing problems. Follow instructions from your health care provider about wearing your sleep device: Anytime you are sleeping, including during daytime naps. While taking prescription pain medicines, sleeping medicines, or medicines that make you drowsy. Avoid smoking. Keep all follow-up visits as told by your health care provider. This is important. Contact a health care provider if: You keep feeling nauseous or you keep vomiting. You feel light-headed. You are still sleepy or having trouble with balance after 24 hours. You develop a rash. You have a fever. You have redness or swelling around the IV site. Get help right away if: You have trouble breathing. You have new-onset confusion at home. Summary For several hours after your procedure, you may feel tired. You may also be forgetful and have poor judgment. Have a responsible adult stay with you for the time you are told. It is important to have someone help care for you until you are awake and alert. Rest as told. Do not drive or operate machinery. Do not drink alcohol or take sleeping pills. Get help right away if you have trouble breathing, or if you suddenly become confused. This information is not intended to replace advice given to you by your health care provider. Make sure you discuss any questions you have with your health care provider. Document  Revised: 12/28/2020 Document Reviewed: 12/26/2018 Elsevier Patient Education  [place].

## 2021-11-29 ENCOUNTER — Encounter (HOSPITAL_COMMUNITY)
Admission: RE | Admit: 2021-11-29 | Discharge: 2021-11-29 | Disposition: A | Discharge status: Home / Self Care | Payer: Medicaid Other | Source: Ambulatory Visit | Attending: Internal Medicine | Admitting: Internal Medicine

## 2021-11-29 ENCOUNTER — Encounter (HOSPITAL_COMMUNITY): Payer: Self-pay

## 2021-11-29 VITALS — BP 146/94 | HR 112 | Temp 97.8°F | Resp 18 | Ht 72.0 in | Wt 158.3 lb

## 2021-11-29 DIAGNOSIS — D649 Anemia, unspecified: Secondary | ICD-10-CM | POA: Diagnosis not present

## 2021-11-29 DIAGNOSIS — Z79899 Other long term (current) drug therapy: Secondary | ICD-10-CM | POA: Diagnosis not present

## 2021-11-29 DIAGNOSIS — Z01812 Encounter for preprocedural laboratory examination: Secondary | ICD-10-CM | POA: Diagnosis present

## 2021-11-29 HISTORY — DX: Dyspnea, unspecified: R06.00

## 2021-11-29 HISTORY — DX: Panic disorder (episodic paroxysmal anxiety): F41.0

## 2021-11-29 HISTORY — DX: Cardiac arrhythmia, unspecified: I49.9

## 2021-11-29 LAB — CBC WITH DIFFERENTIAL/PLATELET
Abs Immature Granulocytes: 0.01 10*3/uL (ref 0.00–0.07)
Basophils Absolute: 0 10*3/uL (ref 0.0–0.1)
Basophils Relative: 0 %
Eosinophils Absolute: 0.1 10*3/uL (ref 0.0–0.5)
Eosinophils Relative: 1 %
HCT: 43 % (ref 39.0–52.0)
Hemoglobin: 13.9 g/dL (ref 13.0–17.0)
Immature Granulocytes: 0 %
Lymphocytes Relative: 42 %
Lymphs Abs: 2 10*3/uL (ref 0.7–4.0)
MCH: 28.5 pg (ref 26.0–34.0)
MCHC: 32.3 g/dL (ref 30.0–36.0)
MCV: 88.1 fL (ref 80.0–100.0)
Monocytes Absolute: 0.5 10*3/uL (ref 0.1–1.0)
Monocytes Relative: 9 %
Neutro Abs: 2.3 10*3/uL (ref 1.7–7.7)
Neutrophils Relative %: 48 %
Platelets: 288 10*3/uL (ref 150–400)
RBC: 4.88 MIL/uL (ref 4.22–5.81)
RDW: 14.4 % (ref 11.5–15.5)
WBC: 4.9 10*3/uL (ref 4.0–10.5)
nRBC: 0 % (ref 0.0–0.2)

## 2021-11-29 LAB — BASIC METABOLIC PANEL
Anion gap: 10 (ref 5–15)
BUN: 28 mg/dL — ABNORMAL HIGH (ref 6–20)
CO2: 24 mmol/L (ref 22–32)
Calcium: 10.6 mg/dL — ABNORMAL HIGH (ref 8.9–10.3)
Chloride: 105 mmol/L (ref 98–111)
Creatinine, Ser: 2.25 mg/dL — ABNORMAL HIGH (ref 0.61–1.24)
GFR, Estimated: 35 mL/min — ABNORMAL LOW (ref >60)
Glucose, Bld: 105 mg/dL — ABNORMAL HIGH (ref 70–99)
Potassium: 3.6 mmol/L (ref 3.5–5.1)
Sodium: 139 mmol/L (ref 135–145)

## 2021-11-29 LAB — IRON AND TIBC
Iron: 101 ug/dL (ref 45–182)
Saturation Ratios: 24 % (ref 17.9–39.5)
TIBC: 421 ug/dL (ref 250–450)
UIBC: 320 ug/dL

## 2021-11-29 LAB — FERRITIN: Ferritin: 15 ng/mL — ABNORMAL LOW (ref 24–336)

## 2021-12-01 ENCOUNTER — Ambulatory Visit (HOSPITAL_BASED_OUTPATIENT_CLINIC_OR_DEPARTMENT_OTHER): Payer: Medicaid Other | Admitting: Anesthesiology

## 2021-12-01 ENCOUNTER — Other Ambulatory Visit: Payer: Self-pay

## 2021-12-01 ENCOUNTER — Ambulatory Visit (HOSPITAL_COMMUNITY)
Admission: RE | Admit: 2021-12-01 | Discharge: 2021-12-01 | Disposition: A | Discharge status: Home / Self Care | Payer: Medicaid Other | Attending: Internal Medicine | Admitting: Internal Medicine

## 2021-12-01 ENCOUNTER — Encounter (HOSPITAL_COMMUNITY): Payer: Self-pay | Admitting: Internal Medicine

## 2021-12-01 ENCOUNTER — Ambulatory Visit (HOSPITAL_COMMUNITY): Payer: Medicaid Other | Admitting: Anesthesiology

## 2021-12-01 ENCOUNTER — Encounter (HOSPITAL_COMMUNITY)
Admission: RE | Disposition: A | Discharge status: Home / Self Care | Payer: Self-pay | Source: Home / Self Care | Attending: Internal Medicine

## 2021-12-01 DIAGNOSIS — K2100 Gastro-esophageal reflux disease with esophagitis, without bleeding: Secondary | ICD-10-CM | POA: Diagnosis not present

## 2021-12-01 DIAGNOSIS — I110 Hypertensive heart disease with heart failure: Secondary | ICD-10-CM | POA: Insufficient documentation

## 2021-12-01 DIAGNOSIS — K2950 Unspecified chronic gastritis without bleeding: Secondary | ICD-10-CM | POA: Insufficient documentation

## 2021-12-01 DIAGNOSIS — I252 Old myocardial infarction: Secondary | ICD-10-CM | POA: Diagnosis not present

## 2021-12-01 DIAGNOSIS — F419 Anxiety disorder, unspecified: Secondary | ICD-10-CM

## 2021-12-01 DIAGNOSIS — F17210 Nicotine dependence, cigarettes, uncomplicated: Secondary | ICD-10-CM | POA: Insufficient documentation

## 2021-12-01 DIAGNOSIS — K921 Melena: Secondary | ICD-10-CM | POA: Insufficient documentation

## 2021-12-01 DIAGNOSIS — K641 Second degree hemorrhoids: Secondary | ICD-10-CM | POA: Diagnosis not present

## 2021-12-01 DIAGNOSIS — K279 Peptic ulcer, site unspecified, unspecified as acute or chronic, without hemorrhage or perforation: Secondary | ICD-10-CM | POA: Diagnosis not present

## 2021-12-01 DIAGNOSIS — Z980 Intestinal bypass and anastomosis status: Secondary | ICD-10-CM | POA: Diagnosis not present

## 2021-12-01 DIAGNOSIS — I2510 Atherosclerotic heart disease of native coronary artery without angina pectoris: Secondary | ICD-10-CM | POA: Diagnosis not present

## 2021-12-01 DIAGNOSIS — K2210 Ulcer of esophagus without bleeding: Secondary | ICD-10-CM | POA: Diagnosis not present

## 2021-12-01 DIAGNOSIS — Z9049 Acquired absence of other specified parts of digestive tract: Secondary | ICD-10-CM | POA: Diagnosis not present

## 2021-12-01 DIAGNOSIS — K219 Gastro-esophageal reflux disease without esophagitis: Secondary | ICD-10-CM | POA: Insufficient documentation

## 2021-12-01 DIAGNOSIS — K311 Adult hypertrophic pyloric stenosis: Secondary | ICD-10-CM | POA: Diagnosis not present

## 2021-12-01 DIAGNOSIS — K649 Unspecified hemorrhoids: Secondary | ICD-10-CM | POA: Diagnosis not present

## 2021-12-01 HISTORY — PX: COLONOSCOPY WITH PROPOFOL: SHX5780

## 2021-12-01 HISTORY — PX: BIOPSY: SHX5522

## 2021-12-01 HISTORY — PX: ESOPHAGOGASTRODUODENOSCOPY (EGD) WITH PROPOFOL: SHX5813

## 2021-12-01 SURGERY — COLONOSCOPY WITH PROPOFOL
Anesthesia: General

## 2021-12-01 MED ORDER — STERILE WATER FOR IRRIGATION IR SOLN
Status: DC | PRN
  Administered 2021-12-01 (×2): 60 mL

## 2021-12-01 MED ORDER — LIDOCAINE HCL (PF) 2 % IJ SOLN
INTRAMUSCULAR | Status: AC
  Filled 2021-12-01: qty 5

## 2021-12-01 MED ORDER — LIDOCAINE HCL (CARDIAC) PF 100 MG/5ML IV SOSY
PREFILLED_SYRINGE | INTRAVENOUS | Status: DC | PRN
  Administered 2021-12-01: 50 mg via INTRAVENOUS

## 2021-12-01 MED ORDER — DEXMEDETOMIDINE HCL IN NACL 80 MCG/20ML IV SOLN
INTRAVENOUS | Status: DC | PRN
  Administered 2021-12-01: 20 ug via BUCCAL

## 2021-12-01 MED ORDER — LACTATED RINGERS IV SOLN: INTRAVENOUS | Status: DC

## 2021-12-01 MED ORDER — PROPOFOL 500 MG/50ML IV EMUL
INTRAVENOUS | Status: DC | PRN
  Administered 2021-12-01: 150 ug/kg/min via INTRAVENOUS

## 2021-12-01 MED ORDER — DEXMEDETOMIDINE HCL IN NACL 80 MCG/20ML IV SOLN
INTRAVENOUS | Status: AC
  Filled 2021-12-01: qty 20

## 2021-12-01 MED ORDER — PROPOFOL 10 MG/ML IV BOLUS
INTRAVENOUS | Status: DC | PRN
  Administered 2021-12-01: 50 mg via INTRAVENOUS
  Administered 2021-12-01 (×2): 100 mg via INTRAVENOUS
  Administered 2021-12-01: 50 mg via INTRAVENOUS

## 2021-12-01 MED ORDER — PHENYLEPHRINE HCL (PRESSORS) 10 MG/ML IV SOLN
INTRAVENOUS | Status: DC | PRN
  Administered 2021-12-01 (×4): 160 ug via INTRAVENOUS

## 2021-12-01 NOTE — Transfer of Care (Signed)
Immediate Anesthesia Transfer of Care Note  Patient: Anthony Vaughn  Procedure(s) Performed: COLONOSCOPY WITH PROPOFOL ESOPHAGOGASTRODUODENOSCOPY (EGD) WITH PROPOFOL BIOPSY Balloon dilation wire-guided  Patient Location: PACU and Short Stay  Anesthesia Type:General  Level of Consciousness: awake, drowsy and patient cooperative  Airway & Oxygen Therapy: Patient Spontanous Breathing  Post-op Assessment: Report given to RN, Post -op Vital signs reviewed and stable and Patient moving all extremities X 4  Post vital signs: Reviewed and stable  Last Vitals:  Vitals Value Taken Time  BP 84/61 12/01/21 1354  Temp    Pulse 69 12/01/21 1351  Resp 18 12/01/21 1351  SpO2 93 % 12/01/21 1351    Last Pain:  Vitals:   12/01/21 1351  TempSrc: Oral  PainSc:          Complications: No notable events documented.

## 2021-12-01 NOTE — H&P (Signed)
@LOGO @   Primary Care Physician:  , MD Primary Gastroenterologist:  Dr.   Pre-Procedure History & Physical: HPI:  Anthony Vaughn is a 51 y.o. male here for   Further evaluation of ongoing epigastric pain in the setting of ongoing ibuprofen use.  No dysphagia.  Having left-sided abdominal pain intermittent paper hematochezia as well.  This is no better after partial colectomy previously.  He is here for both an EGD and colonoscopy per plan  Past Medical History:  Diagnosis Date   Arthritis    CAD (coronary artery disease)    Chronic systolic heart failure (HCC)    Diverticulitis    s/p partial colectomy due to stenotic sigmoind colon   Dyspnea    Dysrhythmia    GERD (gastroesophageal reflux disease)    HTN (hypertension)    Myocardial infarction (HCC) 2021   Panic attacks    SVT (supraventricular tachycardia)     Past Surgical History:  Procedure Laterality Date   BIOPSY  03/04/2019   Procedure: BIOPSY;  Surgeon: 03/06/2019, MD;  Location: [facility];  Service: Endoscopy;;  gastric   COLONOSCOPY     COLONOSCOPY WITH PROPOFOL N/A 03/04/2019   Surgeon: 03/06/2019, MD;  Diverticulosis in the entire colon with a stenotic, stiff, chronically inflamed sigmoid segment.  Low threshold for surgical consultation.  Grade 1 internal hemorrhoids. Repeat in 10 years.   ESOPHAGOGASTRODUODENOSCOPY (EGD) WITH PROPOFOL N/A 03/04/2019   Surgeon: 03/06/2019, MD;  gastric mucosal changes found to be chronic gastritis and focal intestinal metaplasia negative for H. 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   LAPAROSCOPIC PARTIAL COLECTOMY N/A 07/14/2019   Procedure: LAPAROSCOPIC  LEFT HEMICOLECTOMY;  Surgeon: 09/13/2019, MD;  Location: [facility];  Service: General;  Laterality: N/A;    Prior to Admission medications   Medication Sig Start Date End Date Taking? Authorizing Provider  amLODipine  (NORVASC) 10 MG tablet Take 1 tablet (10 mg total) by mouth daily. For Blood Pressure 07/17/19  Yes Emokpae, Courage, MD  aspirin EC 81 MG tablet Take 1 tablet (81 mg total) by mouth daily. Swallow whole. 10/18/20  Yes 12/18/20., NP  docusate sodium (COLACE) 100 MG capsule Take 100 mg by mouth daily as needed for mild constipation.   Yes [provider]  ferrous sulfate 325 (65 FE) MG tablet 1 tablet with meal Orally daily for 30 day(s) 09/15/21  Yes [provider]  metoprolol succinate (TOPROL-XL) 50 MG 24 hr tablet Take 1.5 tablets (75 mg total) by mouth daily. Take with or immediately following a meal. 10/21/21  Yes Branch, 10/23/21, MD  pantoprazole (PROTONIX) 40 MG tablet Take 1 tablet (40 mg total) by mouth 2 (two) times daily before a meal. 09/28/21 09/28/22 Yes Harper, Kristen S, PA-C  rosuvastatin (CRESTOR) 10 MG tablet SMARTSIG:1 Tablet(s) By Mouth 09/15/21  Yes [provider]  sacubitril-valsartan (ENTRESTO) 49-51 MG Take 1 tablet by mouth 2 (two) times daily. 09/29/21  Yes Branch8/26/23, MD  sildenafil (REVATIO) 20 MG tablet Take 20-60 mg by mouth daily as needed for erectile dysfunction. 03/24/21  Yes [provider]  spironolactone (ALDACTONE) 25 MG tablet Take 0.5 tablets (12.5 mg total) by mouth daily. 10/21/21  Yes Branch, 10/23/21, MD  sucralfate (CARAFATE) 1 GM/10ML suspension Take 10 mLs (1 g total) by mouth 4 (four) times daily. 11/16/21 11/16/22 Yes 01/16/23, PA-C  lidocaine (XYLOCAINE)  2 % solution place 66mls ON back of THE TONGUE AND SWALLOW EVERY 6 HOURS AS NEEDED FOR ABDOMINAL pain 11/04/21   Erenest Rasher, PA-C  ondansetron (ZOFRAN) 4 MG tablet Take 4 mg by mouth every 8 (eight) hours as needed for nausea or vomiting.    [provider]  polyethylene glycol (MIRALAX / GLYCOLAX) 17 g packet Take 17 g by mouth daily.    [provider]    Allergies as of 10/17/2021 - Review Complete 09/28/2021   Allergen Reaction Noted   Other  02/04/2019    Family History  Problem Relation Age of Onset   Lung cancer Father    Colon cancer Neg Hx    Colon polyps Neg Hx     Social History   Socioeconomic History   Marital status: Single    Spouse name: Not on file   Number of children: Not on file   Years of education: Not on file   Highest education level: Not on file  Occupational History   Not on file  Tobacco Use   Smoking status: Every Day    Packs/day: 0.50    Years: 25.00    Total pack years: 12.50    Types: Cigarettes   Smokeless tobacco: Never  Vaping Use   Vaping Use: Never used  Substance and Sexual Activity   Alcohol use: No   Drug use: No   Sexual activity: Yes  Other Topics Concern   Not on file  Social History Narrative   Not on file   Social Determinants of Health   Financial Resource Strain: Not on file  Food Insecurity: Not on file  Transportation Needs: Not on file  Physical Activity: Not on file  Stress: Not on file  Social Connections: Not on file  Intimate Partner Violence: Not on file    Review of Systems: See HPI, otherwise negative ROS  Physical Exam: BP (!) (P) 127/95   Pulse (!) (P) 59   Temp (P) 98.1 F (36.7 C) (Oral)   Resp (P) 15   SpO2 (P) 96%  General:   Alert,  Well-developed, well-nourished, pleasant and cooperative in NAD Neck:  Supple; no masses or thyromegaly. No significant cervical adenopathy. Lungs:  Clear throughout to auscultation.   No wheezes, crackles, or rhonchi. No acute distress. Heart:  Regular rate and rhythm; no murmurs, clicks, rubs,  or gallops. Abdomen: Non-distended, normal bowel sounds.  Soft and nontender without appreciable mass or hepatosplenomegaly.  Pulses:  Normal pulses noted. Extremities:  Without clubbing or edema.  Impression/Plan:   51 year old gentleman with ongoing dyspepsia/epigastric pain in the setting of ongoing daily ibuprofen use.  No dysphagia.  Paper hematochezia.  Status post  partial colectomy previously.  He is here for EGD as well as a colonoscopy to further evaluate.  The risks, benefits, limitations, imponderables and alternatives regarding both EGD and colonoscopy have been reviewed with the patient. Questions have been answered. All parties agreeable.       Notice: This dictation was prepared with Dragon dictation along with smaller phrase technology. Any transcriptional errors that result from this process are unintentional and may not be corrected upon review.

## 2021-12-01 NOTE — Discharge Instructions (Signed)
Colonoscopy Discharge Instructions  Read the instructions outlined below and refer to this sheet in the next few weeks. These discharge instructions provide you with general information on caring for yourself after you leave the hospital. Your doctor may also give you specific instructions. While your treatment has been planned according to the most current medical practices available, unavoidable complications occasionally occur. If you have any problems or questions after discharge, call Dr. Gala Romney at [phone]. ACTIVITY You may resume your regular activity, but move at a slower pace for the next 24 hours.  Take frequent rest periods for the next 24 hours.  Walking will help get rid of the air and reduce the bloated feeling in your belly (abdomen).  No driving for 24 hours (because of the medicine (anesthesia) used during the test).   Do not sign any important legal documents or operate any machinery for 24 hours (because of the anesthesia used during the test).  NUTRITION Drink plenty of fluids.  You may resume your normal diet as instructed by your doctor.  Begin with a light meal and progress to your normal diet. Heavy or fried foods are harder to digest and may make you feel sick to your stomach (nauseated).  Avoid alcoholic beverages for 24 hours or as instructed.  MEDICATIONS You may resume your normal medications unless your doctor tells you otherwise.  WHAT YOU CAN EXPECT TODAY Some feelings of bloating in the abdomen.  Passage of more gas than usual.  Spotting of blood in your stool or on the toilet paper.  IF YOU HAD POLYPS REMOVED DURING THE COLONOSCOPY: No aspirin products for 7 days or as instructed.  No alcohol for 7 days or as instructed.  Eat a soft diet for the next 24 hours.  FINDING OUT THE RESULTS OF YOUR TEST Not all test results are available during your visit. If your test results are not back during the visit, make an appointment with your caregiver to find out the  results. Do not assume everything is normal if you have not heard from your caregiver or the medical facility. It is important for you to follow up on all of your test results.  SEEK IMMEDIATE MEDICAL ATTENTION IF: You have more than a spotting of blood in your stool.  Your belly is swollen (abdominal distention).  You are nauseated or vomiting.  You have a temperature over 101.  You have abdominal pain or discomfort that is severe or gets worse throughout the day.   EGD Discharge instructions Please read the instructions outlined below and refer to this sheet in the next few weeks. These discharge instructions provide you with general information on caring for yourself after you leave the hospital. Your doctor may also give you specific instructions. While your treatment has been planned according to the most current medical practices available, unavoidable complications occasionally occur. If you have any problems or questions after discharge, please call your doctor. ACTIVITY You may resume your regular activity but move at a slower pace for the next 24 hours.  Take frequent rest periods for the next 24 hours.  Walking will help expel (get rid of) the air and reduce the bloated feeling in your abdomen.  No driving for 24 hours (because of the anesthesia (medicine) used during the test).  You may shower.  Do not sign any important legal documents or operate any machinery for 24 hours (because of the anesthesia used during the test).  NUTRITION Drink plenty of fluids.  You may  resume your normal diet.  Begin with a light meal and progress to your normal diet.  Avoid alcoholic beverages for 24 hours or as instructed by your caregiver.  MEDICATIONS You may resume your normal medications unless your caregiver tells you otherwise.  WHAT YOU CAN EXPECT TODAY You may experience abdominal discomfort such as a feeling of fullness or "gas" pains.  FOLLOW-UP Your doctor will discuss the results of  your test with you.  SEEK IMMEDIATE MEDICAL ATTENTION IF ANY OF THE FOLLOWING OCCUR: Excessive nausea (feeling sick to your stomach) and/or vomiting.  Severe abdominal pain and distention (swelling).  Trouble swallowing.  Temperature over 101 F (37.8 C).  Rectal bleeding or vomiting of blood.    You have internal hemorrhoids found Today on colonoscopy.  Recommend repeat colonoscopy in 10 years.  You have narrowing in your small intestine causing your stomach not to empty well.  The narrowed areas were stretched  You have extensive ulcers in your stomach.  This is being called by NSAID medication.  Please stop all forms of NSAIDs like ibuprofen/Advil.   continue Protonix 40 mg twice daily before meals  Office visit with [place] in 4 weeks.  At patient request, I called [name] -  reviewed findings and recommendations

## 2021-12-01 NOTE — Anesthesia Preprocedure Evaluation (Signed)
Anesthesia Evaluation  Patient identified by MRN, date of birth, ID band Patient awake    Reviewed: Allergy & Precautions, H&P , NPO status , Patient's Chart, lab work & pertinent test results, reviewed documented beta blocker date and time   Airway Mallampati: II  TM Distance: >3 FB Neck ROM: full    Dental no notable dental hx.    Pulmonary shortness of breath, Current Smoker and Patient abstained from smoking.,    Pulmonary exam normal breath sounds clear to auscultation       Cardiovascular Exercise Tolerance: Good hypertension, + CAD and + Past MI  + dysrhythmias Supra Ventricular Tachycardia  Rhythm:regular Rate:Normal     Neuro/Psych PSYCHIATRIC DISORDERS Anxiety negative neurological ROS     GI/Hepatic Neg liver ROS, GERD  Medicated,  Endo/Other  negative endocrine ROS  Renal/GU negative Renal ROS  negative genitourinary   Musculoskeletal   Abdominal   Peds  Hematology  (+) Blood dyscrasia, anemia ,   Anesthesia Other Findings   Reproductive/Obstetrics negative OB ROS                             Anesthesia Physical Anesthesia Plan  ASA: 3  Anesthesia Plan: General   Post-op Pain Management:    Induction:   PONV Risk Score and Plan: Propofol infusion  Airway Management Planned:   Additional Equipment:   Intra-op Plan:   Post-operative Plan:   Informed Consent: I have reviewed the patients History and Physical, chart, labs and discussed the procedure including the risks, benefits and alternatives for the proposed anesthesia with the patient or authorized representative who has indicated his/her understanding and acceptance.     Dental Advisory Given  Plan Discussed with: CRNA  Anesthesia Plan Comments:         Anesthesia Quick Evaluation

## 2021-12-01 NOTE — Op Note (Signed)
[facility] Patient Name: Anthony Vaughn Procedure Date: 12/01/2021 11:38 AM MRN: [id number] Date of Birth: 1970-12-12 Attending MD: Norvel Richards , MD, [id number] CSN: [id number] Age: 51 Admit Type: Outpatient Procedure:                Colonoscopy Indications:              Hematochezia Providers:                Norvel Richards, MD, Caprice Kluver, Gwynneth Albright RN, RN, Raphael Gibney, Technician Referring MD:              Medicines:                Propofol per Anesthesia Complications:            No immediate complications. Estimated Blood Loss:     Estimated blood loss: none. Procedure:                Pre-Anesthesia Assessment:                           - Prior to the procedure, a History and Physical                            was performed, and patient medications and                            allergies were reviewed. The patient's tolerance of                            previous anesthesia was also reviewed. The risks                            and benefits of the procedure and the sedation                            options and risks were discussed with the patient.                            All questions were answered, and informed consent                            was obtained. Prior Anticoagulants: The patient has                            taken no anticoagulant or antiplatelet agents. ASA                            Grade Assessment: III - A patient with severe                            systemic disease. After reviewing the risks and  benefits, the patient was deemed in satisfactory                            condition to undergo the procedure.                           After obtaining informed consent, the colonoscope                            was passed under direct vision. Throughout the                            procedure, the patient's blood pressure, pulse, and                             oxygen saturations were monitored continuously. The                            [id number]-[id number] [id number] scope was introduced through                            the anus and advanced to the the cecum, identified                            by appendiceal orifice and ileocecal valve. The                            colonoscopy was performed without difficulty. The                            patient tolerated the procedure well. The quality                            of the bowel preparation was adequate. The                            ileocecal valve, appendiceal orifice, and rectum                            were photographed. Scope In: 1:35:41 PM Scope Out: 1:44:07 PM Scope Withdrawal Time: 0 hours 6 minutes 24 seconds  Total Procedure Duration: 0 hours 8 minutes 26 seconds  Findings:      The perianal and digital rectal examinations were normal. Patient had       internal hemorrhoids. Grade 2. Surgical anastomosis at 25 cm; the       remainder of the residual colonic mucosa appeared normal. Impression:               Internal hemorrhoids. Status post prior surgical                            resection with anastomosis at 25 cm; remainder of                            residual colonic mucosa appeared  normal. I suspect                            bleeding secondary to hemorrhoids Moderate Sedation:      Moderate (conscious) sedation was personally administered by an       anesthesia professional. The following parameters were monitored: oxygen       saturation, heart rate, blood pressure, respiratory rate, EKG, adequacy       of pulmonary ventilation, and response to care. Recommendation:           - Patient has a contact number available for                            emergencies. The signs and symptoms of potential                            delayed complications were discussed with the                            patient. Return to normal activities tomorrow.                            Written  discharge instructions were provided to the                            patient.                           - Advance diet as tolerated.                           - Continue present medications.                           - Repeat colonoscopy in 10 years for screening                            purposes.                           - Return to GI office in 1 month. See EGD report Procedure Code(s):        --- Professional ---                           [phone], Colonoscopy, flexible; diagnostic, including                            collection of specimen(s) by brushing or washing,                            when performed (separate procedure) Diagnosis Code(s):        --- Professional ---                           K92.1, Melena (includes Hematochezia) CPT copyright 2022 American Medical Association. All rights reserved. The codes documented in this report are preliminary  and upon coder review may  be revised to meet current compliance requirements. Cristopher Estimable. Jakiyah Stepney, MD Norvel Richards, MD 12/01/2021 2:29:06 PM This report has been signed electronically. Number of Addenda: 0

## 2021-12-02 ENCOUNTER — Ambulatory Visit (HOSPITAL_COMMUNITY)
Admission: RE | Admit: 2021-12-02 | Discharge: 2021-12-02 | Disposition: A | Discharge status: Home / Self Care | Payer: Medicaid Other | Source: Ambulatory Visit | Attending: Gastroenterology | Admitting: Gastroenterology

## 2021-12-02 DIAGNOSIS — N289 Disorder of kidney and ureter, unspecified: Secondary | ICD-10-CM | POA: Insufficient documentation

## 2021-12-02 MED ORDER — IOHEXOL 300 MG/ML  SOLN
50.0000 mL | Freq: Once | INTRAMUSCULAR | Status: AC | PRN
  Administered 2021-12-02: 80 mL via INTRAVENOUS

## 2021-12-02 NOTE — Anesthesia Postprocedure Evaluation (Signed)
Anesthesia Post Note  Patient: [name]  Procedure(s) Performed: COLONOSCOPY WITH PROPOFOL ESOPHAGOGASTRODUODENOSCOPY (EGD) WITH PROPOFOL BIOPSY Balloon dilation wire-guided  Patient location during evaluation: Phase II Anesthesia Type: General Level of consciousness: awake Pain management: pain level controlled Vital Signs Assessment: post-procedure vital signs reviewed and stable Respiratory status: spontaneous breathing and respiratory function stable Cardiovascular status: blood pressure returned to baseline and stable Postop Assessment: no headache and no apparent nausea or vomiting Anesthetic complications: no Comments: Late entry   No notable events documented.   Last Vitals:  Vitals:   12/01/21 1354 12/01/21 1357  BP: (!) 84/61 117/81  Pulse:  71  Resp:  20  Temp:    SpO2:  95%    Last Pain:  Vitals:   12/01/21 1351  TempSrc: Oral  PainSc:                  Anthony Vaughn

## 2021-12-05 ENCOUNTER — Encounter (HOSPITAL_COMMUNITY): Payer: Self-pay | Admitting: Internal Medicine

## 2021-12-05 LAB — SURGICAL PATHOLOGY

## 2021-12-06 ENCOUNTER — Telehealth: Payer: Self-pay | Admitting: *Deleted

## 2021-12-06 NOTE — Telephone Encounter (Signed)
Call placed to [facility] - confirmed that [facility] has finally gone through with a $4.00 co-pay.  Patient notified via detailed voice message.

## 2021-12-07 ENCOUNTER — Ambulatory Visit: Payer: Medicaid Other | Attending: Cardiology | Admitting: Cardiology

## 2021-12-07 ENCOUNTER — Encounter: Payer: Self-pay | Admitting: Internal Medicine

## 2021-12-07 ENCOUNTER — Encounter: Payer: Self-pay | Admitting: Cardiology

## 2021-12-07 VITALS — BP 130/85 | HR 85 | Ht 72.0 in | Wt 158.8 lb

## 2021-12-07 DIAGNOSIS — I2510 Atherosclerotic heart disease of native coronary artery without angina pectoris: Secondary | ICD-10-CM

## 2021-12-07 DIAGNOSIS — E782 Mixed hyperlipidemia: Secondary | ICD-10-CM

## 2021-12-07 DIAGNOSIS — I471 Supraventricular tachycardia, unspecified: Secondary | ICD-10-CM

## 2021-12-07 DIAGNOSIS — I5022 Chronic systolic (congestive) heart failure: Secondary | ICD-10-CM

## 2021-12-07 DIAGNOSIS — Z79899 Other long term (current) drug therapy: Secondary | ICD-10-CM

## 2021-12-07 MED ORDER — SACUBITRIL-VALSARTAN 49-51 MG PO TABS: 1.0000 | ORAL_TABLET | Freq: Every day | ORAL | Status: DC

## 2021-12-07 MED ORDER — METOPROLOL SUCCINATE ER 100 MG PO TB24: 100.0000 mg | ORAL_TABLET | Freq: Every day | ORAL | 6 refills | Status: DC

## 2021-12-07 NOTE — Op Note (Signed)
[facility] Patient Name: Anthony Vaughn Procedure Date: 12/01/2021 11:40 AM MRN: [id number] Date of Birth: 09/14/1970 Attending MD: Gennette Pac , MD, [id number] CSN: [id number] Age: 51 Admit Type: Outpatient Procedure:                Upper GI endoscopy Indications:              Epigastric abdominal pain /denies dysphagia to me Providers:                Gennette Pac, MD, [place] Scrape RN,                            RN, Angelica Ran, Pandora Leiter, Technician Referring MD:              Medicines:                Propofol per Anesthesia Complications:            No immediate complications. Estimated Blood Loss:     Estimated blood loss was minimal. Procedure:                Pre-Anesthesia Assessment:                           - Prior to the procedure, a History and Physical                            was performed, and patient medications and                            allergies were reviewed. The patient's tolerance of                            previous anesthesia was also reviewed. The risks                            and benefits of the procedure and the sedation                            options and risks were discussed with the patient.                            All questions were answered, and informed consent                            was obtained. Prior Anticoagulants: The patient has                            taken no anticoagulant or antiplatelet agents. ASA                            Grade Assessment: III - A patient with severe                            systemic disease. After reviewing the risks and  benefits, the patient was deemed in satisfactory                            condition to undergo the procedure.                           After obtaining informed consent, the endoscope was                            passed under direct vision. Throughout the                            procedure, the patient's blood  pressure, pulse, and                            oxygen saturations were monitored continuously. The                            [id number]-H190 ([id number]) scope was introduced through the                            mouth, and advanced to the second part of duodenum. Scope In: 12:43:14 PM Scope Out: 1:28:05 PM Total Procedure Duration: 0 hours 44 minutes 51 seconds  Findings:      Distal esophageal erosions within 5 mm of the GE junction. No Barrett's       epithelium tubular esophagus patent throughout its course. Stomach       empty. Small hiatal hernia. Multiple antral and gastric body erosions.       Single 1 mm elliptical ulcer in the prepyloric antral mucosa. Pylorus       patent. 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 There was not duodenal ulcer at the level of the this       stricture. I obtained a TTS balloon, pladed it across proximal stenosis       and inflated it to 10 mm for 1 minute took it down. Was able to admit       the adult gastroscope but could advance it further as I ran out of scope       (stomach somewhat dilated). I withdrew the scope and obtained the       ultraslim pediatric colonoscope. Same issue with running out of scope to       advance up to the the second stricture seen. I wrote was able to place a       TTS balloon across the second stricture with the adult gastroscope and       inflated it to 10 mm x 30 seconds. Took it down. This was helpful. I ran       out of his scope however once again. I removed the adult scope and       obtained the pediatric colonoscope was able to introduce into the       stomach advanced across the pylorus  and for stricture I oppose the tip       of the scope against the second stricture and was able to get across it.       There was a third  stricture a couple centimeters distal to the second       stricture to which I approximated the tip of the colonoscope and applied       steady pressure was able to get across it. The more distal second       portion of the duodenum appeared normal. I pulled the scope back across       the proximal duodenum and bulb and reconfirmed that there were 3       distinct area of stricture distal to the pylorus which was patent. There       was circumferential ulceration of the at the level of the first and       second strictures present prior to the dilation. Dilation and scope       advancement was done without apparent complication.      Biopsies of the antrum and body were taken for histologic study. Impression:               Erosive reflux esophagitis. Small hiatal hernia.                            Gastric erosions. Prepyloric gastric ulcer -status                            post biopsy. Patent pylorus. 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                            Lesions appeared benign. High likely NSAID abuse is                            the cause. Moderate Sedation:      Moderate (conscious) sedation was personally administered by an       anesthesia professional. The following parameters were monitored: oxygen       saturation, heart rate, blood pressure, respiratory rate, EKG, adequacy       of pulmonary ventilation, and response to care. Recommendation:           - Patient has a contact number available for  emergencies. The signs and symptoms of potential                            delayed  complications were discussed with the                            patient. Return to normal activities tomorrow.                            Written discharge instructions were provided to the                            patient.                           - Resume previous diet.                           - Continue present medications. Continue twice                            daily PPI. Avoid NSAID agents                           - Return to my office in 3 months. anticipate                            repeat EGD (utilizing the pediatric colonoscope) in                            about 3 months. Procedure Code(s):        --- Professional ---                           [phone], Esophagogastroduodenoscopy, flexible,                            transoral; diagnostic, including collection of                            specimen(s) by brushing or washing, when performed                            (separate procedure) Diagnosis Code(s):        --- Professional ---                           R10.13, Epigastric pain CPT copyright 2022 American Medical Association. All rights reserved. The codes documented in this report are preliminary and upon coder review may  be revised to meet current compliance requirements. Cristopher Estimable. Leeann Bady, MD Norvel Richards, MD 12/07/2021 7:38:58 AM This report has been signed electronically. Number of Addenda: 0

## 2021-12-07 NOTE — Progress Notes (Signed)
Clinical Summary Mr. Laday is a 51 y.o.male seen today as a new patient for the following medical problems.      1. Palpitations/SVT - ER visit yesterday with palpitations, nausea, severe SOB - from notes EMS found him to be in SVT, given rounds of adenosine without improvement - ER notes mention EKG with SVT rate 200 - developed some hypotension, required cardioversion -after cardioversion discussions for transfer to [facility] main campus but patient signed out AMA  - Free T4 0.79 TSH 1.18 Mg 1.6 K 3.7 Cr 1.39 Hstrop 131-->279     - coreg caused SOB and fatigue, toprol much better tolerated.   - rare palpitatoins, about once a week for few minutes.    2. HTN - admitted 07/2019 with severe HTN, started on cardene drip -he is compliant withmeds     3. Chronic systolic HF - 08/2534 echo LVEF 40-45%, inferior wall akinetic, basal inferolateral and apical inferior are hypokinetic.   08/2021 echo LVEF 40-45%, grade II dd. The inferior wall is akinetic. The basal inferolateral segment and apical inferior segment are hypokinetic   - has started entresto, started about 2 weeks ago - some DOE that he is reporting is improving. No LE edema   -just taking entresto once daily. -  11/29/21 labs showed significant AKI. Cr up to 2.25 - recent GI issues, some recent decrease intake.  - no recent edema. Some SOB/DOE at times, short to medium distances.      4. History of diverticulitis - recent hemicolectomy 07/2019   5. Presumed CAD - 08/2019 nuclear stress: large iferior infarct, no ischemia - 07/2019 echo inferior wall is akinetic.  - 08/2021 echo LVEF 40-45%, grade II dd. The inferior wall is akinetic. The basal inferolateral segment and apical inferior segment are hypokinetic   - no exertional chest pains.        6. Hyperlipidemia - changed to crestor 5mg  daily Jan 2023 - side effects on lipitor, stomach discomfort - taking crestor 10mg  daily, reports recently increased by  pcp  01/2021 TC 187 TG 50 HDL 68 LDL 109   7. Abdominal pain/Nausea - followed GI Past Medical History:  Diagnosis Date   Arthritis    CAD (coronary artery disease)    Chronic systolic heart failure (HCC)    Diverticulitis    s/p partial colectomy due to stenotic sigmoind colon   Dyspnea    Dysrhythmia    GERD (gastroesophageal reflux disease)    HTN (hypertension)    Myocardial infarction ([place]) 2021   Panic attacks    SVT (supraventricular tachycardia)      Allergies  Allergen Reactions   Other     States he's allergic to a blood pressure pill that causes abdominal pain. He's not sure name of medication     Current Outpatient Medications  Medication Sig Dispense Refill   amLODipine (NORVASC) 10 MG tablet Take 1 tablet (10 mg total) by mouth daily. For Blood Pressure 30 tablet 5   aspirin EC 81 MG tablet Take 1 tablet (81 mg total) by mouth daily. Swallow whole. 90 tablet 3   docusate sodium (COLACE) 100 MG capsule Take 100 mg by mouth daily as needed for mild constipation.     ferrous sulfate 325 (65 FE) MG tablet 1 tablet with meal Orally daily for 30 day(s)     lidocaine (XYLOCAINE) 2 % solution place 59mls ON back of THE TONGUE AND SWALLOW EVERY 6 HOURS AS NEEDED FOR ABDOMINAL pain  300 mL 1   metoprolol succinate (TOPROL-XL) 50 MG 24 hr tablet Take 1.5 tablets (75 mg total) by mouth daily. Take with or immediately following a meal. 90 tablet 1   ondansetron (ZOFRAN) 4 MG tablet Take 4 mg by mouth every 8 (eight) hours as needed for nausea or vomiting.     pantoprazole (PROTONIX) 40 MG tablet Take 1 tablet (40 mg total) by mouth 2 (two) times daily before a meal. 60 tablet 3   polyethylene glycol (MIRALAX / GLYCOLAX) 17 g packet Take 17 g by mouth daily.     rosuvastatin (CRESTOR) 10 MG tablet SMARTSIG:1 Tablet(s) By Mouth     sacubitril-valsartan (ENTRESTO) 49-51 MG Take 1 tablet by mouth 2 (two) times daily. 28 tablet 0   spironolactone (ALDACTONE) 25 MG tablet Take  0.5 tablets (12.5 mg total) by mouth daily. 15 tablet 6   sucralfate (CARAFATE) 1 GM/10ML suspension Take 10 mLs (1 g total) by mouth 4 (four) times daily. 420 mL 5   No current facility-administered medications for this visit.     Past Surgical History:  Procedure Laterality Date   BIOPSY  03/04/2019   Procedure: BIOPSY;  Surgeon: Corbin Ade, MD;  [facility];  Service: Endoscopy;;  gastric   BIOPSY  12/01/2021   Procedure: BIOPSY;  Surgeon: Corbin Ade, MD;  [facility];  Service: Endoscopy;;   COLONOSCOPY     COLONOSCOPY WITH PROPOFOL N/A 03/04/2019   Surgeon: Corbin Ade, MD;  Diverticulosis in the entire colon with a stenotic, stiff, chronically inflamed sigmoid segment.  Low threshold for surgical consultation.  Grade 1 internal hemorrhoids. Repeat in 10 years.   COLONOSCOPY WITH PROPOFOL N/A 12/01/2021   Procedure: COLONOSCOPY WITH PROPOFOL;  Surgeon: Corbin Ade, MD;  [facility];  Service: Endoscopy;  Laterality: N/A;  12:45 pm   ESOPHAGOGASTRODUODENOSCOPY (EGD) WITH PROPOFOL N/A 03/04/2019   Surgeon: Corbin Ade, MD;  gastric mucosal changes found to be chronic gastritis and focal intestinal metaplasia negative for H. 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   ESOPHAGOGASTRODUODENOSCOPY (EGD) WITH PROPOFOL N/A 12/01/2021   Procedure: ESOPHAGOGASTRODUODENOSCOPY (EGD) WITH PROPOFOL;  Surgeon: Corbin Ade, MD;  [facility];  Service: Endoscopy;  Laterality: N/A;   LAPAROSCOPIC PARTIAL COLECTOMY N/A 07/14/2019   Procedure: LAPAROSCOPIC  LEFT HEMICOLECTOMY;  Surgeon: Lucretia Roers, MD;  Location: [facility];  Service: General;  Laterality: N/A;     Allergies  Allergen Reactions   Other     States he's allergic to a blood pressure pill that causes abdominal pain. He's not sure name of medication      Family History  Problem Relation Age of Onset    Lung cancer Father    Colon cancer Neg Hx    Colon polyps Neg Hx      Social History Mr. Ohanesian reports that he has been smoking cigarettes. He has a 12.50 pack-year smoking history. He has never used smokeless tobacco. Mr. Sur reports no history of alcohol use.   Review of Systems CONSTITUTIONAL: No weight loss, fever, chills, weakness or fatigue.  HEENT: Eyes: No visual loss, blurred vision, double vision or yellow sclerae.No hearing loss, sneezing, congestion, runny nose or sore throat.  SKIN: No rash or itching.  CARDIOVASCULAR: per hpi RESPIRATORY: No shortness of breath, cough or sputum.  GASTROINTESTINAL: No anorexia, nausea, vomiting or diarrhea. No abdominal pain or blood.  GENITOURINARY:  No burning on urination, no polyuria NEUROLOGICAL: No headache, dizziness, syncope, paralysis, ataxia, numbness or tingling in the extremities. No change in bowel or bladder control.  MUSCULOSKELETAL: No muscle, back pain, joint pain or stiffness.  LYMPHATICS: No enlarged nodes. No history of splenectomy.  PSYCHIATRIC: No history of depression or anxiety.  ENDOCRINOLOGIC: No reports of sweating, cold or heat intolerance. No polyuria or polydipsia.  Marland Kitchen   Physical Examination Vitals:   12/07/21 0842  BP: (!) 130/94  Pulse: 85  SpO2: 99%   Filed Weights   12/07/21 0842  Weight: 158 lb 12.8 oz (72 kg)    Gen: resting comfortably, no acute distress HEENT: no scleral icterus, pupils equal round and reactive, no palptable cervical adenopathy,  CV: RRR, no m/r/g, no jvd Resp: Clear to auscultation bilaterally GI: abdomen is soft, non-tender, non-distended, normal bowel sounds, no hepatosplenomegaly MSK: extremities are warm, no edema.  Skin: warm, no rash Neuro:  no focal deficits Psych: appropriate affect   Diagnostic Studies  08/2019 nuclear stress There was no ST segment deviation noted during stress. Findings consistent with large prior inferior/inferoseptal  myocardial infarction. This is a high risk study. Risk is based on decreased LVEF, there is prior infarct without myocardium currently at jeopardy. Consider correlating LVEF with echo to better assess risk. The left ventricular ejection fraction is moderately decreased (30%).     08/2021 echo 1. Left ventricular ejection fraction, by estimation, is 40 to 45%. The  left ventricle has mildly decreased function. The left ventricle  demonstrates global hypokinesis. There is moderate left ventricular  hypertrophy. Left ventricular diastolic  parameters are consistent with Grade I diastolic dysfunction (impaired  relaxation). The average left ventricular global longitudinal strain is  -13.3 %. The global longitudinal strain is abnormal.   2. Right ventricular systolic function is normal. The right ventricular  size is normal. Tricuspid regurgitation signal is inadequate for assessing  PA pressure.   3. Left atrial size was mildly dilated.   4. The mitral valve is abnormal. Mild mitral valve regurgitation. No  evidence of mitral stenosis.   5. The aortic valve is tricuspid. Aortic valve regurgitation is not  visualized. No aortic stenosis is present.   6. Aortic dilatation noted. There is mild dilatation of the aortic root,  measuring 40 mm.   7. The inferior vena cava is normal in size with greater than 50%  respiratory variability, suggesting right atrial pressure of 3 mmHg.      Assessment and Plan  SVT - mild symptoms at times, increase toprol to 100mg  daily.   2. Chronic systolic HF - mild LV systolic dysfunction by echo - EKG, echo, nuclear stress  would suggest potential prior inferior infarct - significant AKI on recent labs by GI. We will repeat labs, if ongoign may have to hold entreato and aldactone. Currently just taking entresto once daily, will need to correct pending his labs.      3. Probable CAD - based on imaging - no symptoms,contineuc curren tmeds   4.  Hyperlipidemia - repeat labs   F/u 6 weeks. F/u labs end of this week. Pending Cr may need to address his entresto, aldactone, consider SGLT2i      , M.D

## 2021-12-07 NOTE — Patient Instructions (Signed)
Medication Instructions:  Continue your Entresto at daily for now. Increase Toprol XL to 100mg  daily Continue all other medications.     Labwork: BMET, FLP, Mg - orders given today Reminder:  Nothing to eat or drink after 12 midnight prior to labs. Office will contact with results via phone, letter or mychart.     Testing/Procedures: none  Follow-Up: 6 weeks  Any Other Special Instructions Will Be Listed Below (If Applicable).   If you need a refill on your cardiac medications before your next appointment, please call your pharmacy.

## 2021-12-16 ENCOUNTER — Ambulatory Visit: Payer: Medicaid Other | Admitting: Cardiology

## 2021-12-19 ENCOUNTER — Other Ambulatory Visit (HOSPITAL_COMMUNITY)
Admission: RE | Admit: 2021-12-19 | Discharge: 2021-12-19 | Disposition: A | Discharge status: Home / Self Care | Payer: Medicaid Other | Source: Ambulatory Visit | Attending: Cardiology | Admitting: Cardiology

## 2021-12-19 DIAGNOSIS — I5022 Chronic systolic (congestive) heart failure: Secondary | ICD-10-CM | POA: Insufficient documentation

## 2021-12-19 DIAGNOSIS — Z79899 Other long term (current) drug therapy: Secondary | ICD-10-CM | POA: Diagnosis present

## 2021-12-19 DIAGNOSIS — E782 Mixed hyperlipidemia: Secondary | ICD-10-CM | POA: Insufficient documentation

## 2021-12-19 DIAGNOSIS — I2510 Atherosclerotic heart disease of native coronary artery without angina pectoris: Secondary | ICD-10-CM | POA: Insufficient documentation

## 2021-12-19 LAB — BASIC METABOLIC PANEL
Anion gap: 7 (ref 5–15)
BUN: 22 mg/dL — ABNORMAL HIGH (ref 6–20)
CO2: 25 mmol/L (ref 22–32)
Calcium: 9.5 mg/dL (ref 8.9–10.3)
Chloride: 108 mmol/L (ref 98–111)
Creatinine, Ser: 1.47 mg/dL — ABNORMAL HIGH (ref 0.61–1.24)
GFR, Estimated: 58 mL/min — ABNORMAL LOW (ref >60)
Glucose, Bld: 98 mg/dL (ref 70–99)
Potassium: 3.7 mmol/L (ref 3.5–5.1)
Sodium: 140 mmol/L (ref 135–145)

## 2021-12-19 LAB — LIPID PANEL
Cholesterol: 172 mg/dL (ref 0–200)
HDL: 77 mg/dL (ref >40)
LDL Cholesterol: 91 mg/dL (ref 0–99)
Total CHOL/HDL Ratio: 2.2 RATIO
Triglycerides: 21 mg/dL (ref <150)
VLDL: 4 mg/dL (ref 0–40)

## 2021-12-19 LAB — MAGNESIUM: Magnesium: 1.9 mg/dL (ref 1.7–2.4)

## 2022-01-02 ENCOUNTER — Encounter: Payer: Self-pay | Admitting: *Deleted

## 2022-01-03 ENCOUNTER — Ambulatory Visit: Payer: Medicaid Other | Admitting: Internal Medicine

## 2022-01-05 ENCOUNTER — Other Ambulatory Visit: Payer: Self-pay | Admitting: *Deleted

## 2022-01-05 DIAGNOSIS — Z79899 Other long term (current) drug therapy: Secondary | ICD-10-CM

## 2022-01-05 DIAGNOSIS — I5022 Chronic systolic (congestive) heart failure: Secondary | ICD-10-CM

## 2022-01-05 MED ORDER — ENTRESTO 24-26 MG PO TABS: 1.0000 | ORAL_TABLET | Freq: Two times a day (BID) | ORAL | 6 refills | Status: DC

## 2022-01-05 MED ORDER — ROSUVASTATIN CALCIUM 20 MG PO TABS: 20.0000 mg | ORAL_TABLET | Freq: Every day | ORAL | 6 refills | Status: DC

## 2022-01-23 ENCOUNTER — Other Ambulatory Visit (HOSPITAL_COMMUNITY)
Admission: RE | Admit: 2022-01-23 | Discharge: 2022-01-23 | Disposition: A | Discharge status: Home / Self Care | Payer: Medicaid Other | Source: Ambulatory Visit | Attending: Cardiology | Admitting: Cardiology

## 2022-01-23 DIAGNOSIS — I5022 Chronic systolic (congestive) heart failure: Secondary | ICD-10-CM

## 2022-01-23 DIAGNOSIS — Z79899 Other long term (current) drug therapy: Secondary | ICD-10-CM

## 2022-01-23 LAB — BASIC METABOLIC PANEL
Anion gap: 9 (ref 5–15)
BUN: 17 mg/dL (ref 6–20)
CO2: 24 mmol/L (ref 22–32)
Calcium: 10.2 mg/dL (ref 8.9–10.3)
Chloride: 103 mmol/L (ref 98–111)
Creatinine, Ser: 1.62 mg/dL — ABNORMAL HIGH (ref 0.61–1.24)
GFR, Estimated: 52 mL/min — ABNORMAL LOW (ref >60)
Glucose, Bld: 105 mg/dL — ABNORMAL HIGH (ref 70–99)
Potassium: 4.3 mmol/L (ref 3.5–5.1)
Sodium: 136 mmol/L (ref 135–145)

## 2022-01-25 ENCOUNTER — Telehealth: Payer: Self-pay | Admitting: Cardiology

## 2022-01-25 NOTE — Telephone Encounter (Signed)
  Patient Consent for Virtual Visit   .  :[id number]}   Anthony Vaughn has provided verbal consent on 01/25/2022 for a virtual visit (video or telephone).   CONSENT FOR VIRTUAL VISIT FOR:  Anthony Vaughn  By participating in this virtual visit I agree to the following:  I hereby voluntarily request, consent and authorize [facility] and its employed or contracted physicians, physician assistants, nurse practitioners or other licensed health care professionals (the Practitioner), to provide me with telemedicine health care services (the "Services") as deemed necessary by the treating Practitioner. I acknowledge and consent to receive the Services by the Practitioner via telemedicine. I understand that the telemedicine visit will involve communicating with the Practitioner through live audiovisual communication technology and the disclosure of certain medical information by electronic transmission. I acknowledge that I have been given the opportunity to request an in-person assessment or other available alternative prior to the telemedicine visit and am voluntarily participating in the telemedicine visit.  I understand that I have the right to withhold or withdraw my consent to the use of telemedicine in the course of my care at any time, without affecting my right to future care or treatment, and that the Practitioner or I may terminate the telemedicine visit at any time. I understand that I have the right to inspect all information obtained and/or recorded in the course of the telemedicine visit and may receive copies of available information for a reasonable fee.  I understand that some of the potential risks of receiving the Services via telemedicine include:  Delay or interruption in medical evaluation due to technological equipment failure or disruption; Information transmitted may not be sufficient (e.g. poor resolution of images) to allow for appropriate medical decision making by  the Practitioner; and/or  In rare instances, security protocols could fail, causing a breach of personal health information.  Furthermore, I acknowledge that it is my responsibility to provide information about my medical history, conditions and care that is complete and accurate to the best of my ability. I acknowledge that Practitioner's advice, recommendations, and/or decision may be based on factors not within their control, such as incomplete or inaccurate data provided by me or distortions of diagnostic images or specimens that may result from electronic transmissions. I understand that the practice of medicine is not an exact science and that Practitioner makes no warranties or guarantees regarding treatment outcomes. I acknowledge that a copy of this consent can be made available to me via my patient portal [facility] MyChart), or I can request a printed copy by calling the office of [facility].    I understand that my insurance will be billed for this visit.   I have read or had this consent read to me. I understand the contents of this consent, which adequately explains the benefits and risks of the Services being provided via telemedicine.  I have been provided ample opportunity to ask questions regarding this consent and the Services and have had my questions answered to my satisfaction. I give my informed consent for the services to be provided through the use of telemedicine in my medical care

## 2022-01-26 ENCOUNTER — Ambulatory Visit: Payer: Medicaid Other | Attending: Cardiology | Admitting: Cardiology

## 2022-01-26 ENCOUNTER — Encounter: Payer: Self-pay | Admitting: Cardiology

## 2022-01-26 VITALS — BP 107/75 | HR 99 | Ht 72.0 in

## 2022-01-26 DIAGNOSIS — I5022 Chronic systolic (congestive) heart failure: Secondary | ICD-10-CM

## 2022-01-26 NOTE — Progress Notes (Signed)
Virtual Visit via Telephone Note   Because of Anthony Vaughn's co-morbid illnesses, he is at least at moderate risk for complications without adequate follow up.  This format is felt to be most appropriate for this patient at this time.  The patient did not have access to video technology/had technical difficulties with video requiring transitioning to audio format only (telephone).  All issues noted in this document were discussed and addressed.  No physical exam could be performed with this format.  Please refer to the patient's chart for his consent to telehealth for [facility].    Date:  01/26/2022   ID:  Anthony Vaughn, DOB 07-10-1970, MRN [id number] The patient was identified using 2 identifiers.  Patient Location: Home Provider Location: Office/Clinic   PCP:  Leonie Douglas, [place] Prov[facility] Cardiologist:  Carlyle Dolly, MD     Evaluation Performed:  Follow-Up Visit  Chief Complaint:  Follow up visit  History of Present Illness:    Anthony Vaughn is a 51 y.o. male seen today as a new patient for the following medical problems.      1. Chronic systolic HF - [id number] echo LVEF 40-45%, inferior wall akinetic, basal inferolateral and apical inferior are hypokinetic.   08/2021 echo LVEF 40-45%, grade II dd. The inferior wall is akinetic. The basal inferolateral segment and apical inferior segment are hypokinetic   - has started entresto, started about 2 weeks ago - some DOE that he is reporting is improving. No LE edema   -just taking entresto once daily. -  11/29/21 labs showed significant AKI. Cr up to 2.25    -AKI 11/30/22 labs Cr up to 2.25, on f/u 1.47 then 1.62 - due to Cr we lowered entresto to 24/'26mg'$  bid on 01/02/22 - no recent symptoms      Other medical issues not addressed this visit  1. Palpitations/SVT - ER visit yesterday with palpitations, nausea, severe SOB - from notes EMS found him to be in SVT, given  rounds of adenosine without improvement - ER notes mention EKG with SVT rate 200 - developed some hypotension, required cardioversion -after cardioversion discussions for transfer to [facility] main campus but patient signed out AMA  - Free T4 0.79 TSH 1.18 Mg 1.6 K 3.7 Cr 1.39 Hstrop 131-->279     - coreg caused SOB and fatigue, toprol much better tolerated.   - rare palpitatoins, about once a week for few minutes.    2. HTN - admitted 07/2019 with severe HTN, started on cardene drip -he is compliant withmeds      -    4. History of diverticulitis - recent hemicolectomy 07/2019   5. Presumed CAD - 08/2019 nuclear stress: large iferior infarct, no ischemia - 07/2019 echo inferior wall is akinetic.  - 08/2021 echo LVEF 40-45%, grade II dd. The inferior wall is akinetic. The basal inferolateral segment and apical inferior segment are hypokinetic   - no exertional chest pains.        6. Hyperlipidemia - changed to crestor '5mg'$  daily Jan 2023 - side effects on lipitor, stomach discomfort - taking crestor '10mg'$  daily, reports recently increased by pcp   01/2021 TC 187 TG 50 HDL 68 LDL 109  - 01/02/22 crestor increased to '20mg'$  daily     7. Abdominal pain/Nausea - followed GI Past Medical History:  Diagnosis Date   Arthritis    CAD (coronary artery disease)    Chronic systolic heart failure (HCC)    Diverticulitis  s/p partial colectomy due to stenotic sigmoind colon   Dyspnea    Dysrhythmia    GERD (gastroesophageal reflux disease)    HTN (hypertension)    Myocardial infarction ([place]) 2021   Panic attacks    SVT (supraventricular tachycardia)    Past Surgical History:  Procedure Laterality Date   BIOPSY  03/04/2019   Procedure: BIOPSY;  Surgeon: Daneil Dolin, MD;  [facility];  Service: Endoscopy;;  gastric   BIOPSY  12/01/2021   Procedure: BIOPSY;  Surgeon: Daneil Dolin, MD;  [facility];  Service: Endoscopy;;   COLONOSCOPY     COLONOSCOPY  WITH PROPOFOL N/A 03/04/2019   Surgeon: Daneil Dolin, MD;  Diverticulosis in the entire colon with a stenotic, stiff, chronically inflamed sigmoid segment.  Low threshold for surgical consultation.  Grade 1 internal hemorrhoids. Repeat in 10 years.   COLONOSCOPY WITH PROPOFOL N/A 12/01/2021   Procedure: COLONOSCOPY WITH PROPOFOL;  Surgeon: Daneil Dolin, MD;  [facility];  Service: Endoscopy;  Laterality: N/A;  12:45 pm   ESOPHAGOGASTRODUODENOSCOPY (EGD) WITH PROPOFOL N/A 03/04/2019   Surgeon: Daneil Dolin, MD;  gastric mucosal changes found to be chronic gastritis and focal intestinal metaplasia negative for H. 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   ESOPHAGOGASTRODUODENOSCOPY (EGD) WITH PROPOFOL N/A 12/01/2021   Procedure: ESOPHAGOGASTRODUODENOSCOPY (EGD) WITH PROPOFOL;  Surgeon: Daneil Dolin, MD;  [facility];  Service: Endoscopy;  Laterality: N/A;   LAPAROSCOPIC PARTIAL COLECTOMY N/A 07/14/2019   Procedure: LAPAROSCOPIC  LEFT HEMICOLECTOMY;  Surgeon: Virl Cagey, MD;  Location: [facility];  Service: General;  Laterality: N/A;     No outpatient medications have been marked as taking for the 01/26/22 encounter (Appointment) with Arnoldo Lenis, MD.     Allergies:   Other   Social History   Tobacco Use   Smoking status: Every Day    Packs/day: 0.50    Years: 25.00    Total pack years: 12.50    Types: Cigarettes   Smokeless tobacco: Never  Vaping Use   Vaping Use: Never used  Substance Use Topics   Alcohol use: No   Drug use: No     Family Hx: The patient's family history includes Lung cancer in his father. There is no history of Colon cancer or Colon polyps.  ROS:   Please see the history of present illness.     All other systems reviewed and are negative.   Prior CV studies:   The following studies were reviewed today:  08/2019 nuclear stress There was no ST segment  deviation noted during stress. Findings consistent with large prior inferior/inferoseptal myocardial infarction. This is a high risk study. Risk is based on decreased LVEF, there is prior infarct without myocardium currently at jeopardy. Consider correlating LVEF with echo to better assess risk. The left ventricular ejection fraction is moderately decreased (30%).     08/2021 echo 1. Left ventricular ejection fraction, by estimation, is 40 to 45%. The  left ventricle has mildly decreased function. The left ventricle  demonstrates global hypokinesis. There is moderate left ventricular  hypertrophy. Left ventricular diastolic  parameters are consistent with Grade I diastolic dysfunction (impaired  relaxation). The average left ventricular global longitudinal strain is  -13.3 %. The global longitudinal strain is abnormal.   2. Right ventricular systolic function is normal. The right ventricular  size is normal. Tricuspid regurgitation signal is inadequate for  assessing  PA pressure.   3. Left atrial size was mildly dilated.   4. The mitral valve is abnormal. Mild mitral valve regurgitation. No  evidence of mitral stenosis.   5. The aortic valve is tricuspid. Aortic valve regurgitation is not  visualized. No aortic stenosis is present.   6. Aortic dilatation noted. There is mild dilatation of the aortic root,  measuring 40 mm.   7. The inferior vena cava is normal in size with greater than 50%  respiratory variability, suggesting right atrial pressure of 3 mmHg.  Labs/Other Tests and Data Reviewed:    EKG:  No ECG reviewed.  Recent Labs: 05/23/2021: ALT 10 11/29/2021: Hemoglobin 13.9; Platelets 288 12/19/2021: Magnesium 1.9 01/23/2022: BUN 17; Creatinine, Ser 1.62; Potassium 4.3; Sodium 136   Recent Lipid Panel Lab Results  Component Value Date/Time   CHOL 172 12/19/2021 08:34 AM   TRIG 21 12/19/2021 08:34 AM   HDL 77 12/19/2021 08:34 AM   CHOLHDL 2.2 12/19/2021 08:34 AM    LDLCALC 91 12/19/2021 08:34 AM    Wt Readings from Last 3 Encounters:  12/07/21 158 lb 12.8 oz (72 kg)  11/29/21 158 lb 4.6 oz (71.8 kg)  10/21/21 158 lb 6.4 oz (71.8 kg)     Risk Assessment/Calculations:          Objective:    Vital Signs:   Today's Vitals   01/26/22 0929  BP: 107/75  Pulse: 99  Height: 6' (1.829 m)   Body mass index is 21.54 kg/m. Normal affect. Normal speech pattern and tone. COmfortable, no apparent distress. No audible signs of sob or wheezing.   ASSESSMENT & PLAN:      1. Chronic systolic HF - mild LV systolic dysfunction by echo - EKG, echo, nuclear stress  would suggest potential prior inferior infarct - significant AKI on recent labs by GI. We will repeat labs, if ongoign may have to hold entreato and aldactone.  - continue current meds               Time:   Today, I have spent 15 minutes with the patient with telehealth technology discussing the above problems.     Medication Adjustments/Labs and Tests Ordered: Current medicines are reviewed at length with the patient today.  Concerns regarding medicines are outlined above.   Tests Ordered: No orders of the defined types were placed in this encounter.   Medication Changes: No orders of the defined types were placed in this encounter.     Merrily Pew, MD  01/26/2022 8:03 AM    [place]

## 2022-02-14 ENCOUNTER — Telehealth: Payer: Self-pay | Admitting: *Deleted

## 2022-02-14 ENCOUNTER — Ambulatory Visit (INDEPENDENT_AMBULATORY_CARE_PROVIDER_SITE_OTHER): Payer: Medicaid Other | Admitting: Internal Medicine

## 2022-02-14 ENCOUNTER — Encounter: Payer: Self-pay | Admitting: *Deleted

## 2022-02-14 ENCOUNTER — Encounter: Payer: Self-pay | Admitting: Internal Medicine

## 2022-02-14 ENCOUNTER — Other Ambulatory Visit: Payer: Self-pay | Admitting: *Deleted

## 2022-02-14 VITALS — BP 150/93 | HR 68 | Temp 98.0°F | Ht 72.0 in | Wt 162.2 lb

## 2022-02-14 DIAGNOSIS — I5022 Chronic systolic (congestive) heart failure: Secondary | ICD-10-CM

## 2022-02-14 DIAGNOSIS — I10 Essential (primary) hypertension: Secondary | ICD-10-CM

## 2022-02-14 DIAGNOSIS — R1030 Lower abdominal pain, unspecified: Secondary | ICD-10-CM | POA: Diagnosis not present

## 2022-02-14 DIAGNOSIS — Z79899 Other long term (current) drug therapy: Secondary | ICD-10-CM

## 2022-02-14 NOTE — Telephone Encounter (Signed)
[employer] PA: Approved # [id number] DOS: 03/03/22-06/01/22

## 2022-02-14 NOTE — Patient Instructions (Signed)
It was good to see you again today!  Continue to avoid all nonsteroidals including ibuprofen, etc.  CBC, HP serologies today  As discussed, we will schedule a repeat EGD with stricture dilation as appropriate in the near future.  ASA 3.  Pamphlet on hemorrhoid banding provided.  Office visit with [place] in 2 months.

## 2022-02-14 NOTE — Progress Notes (Signed)
Primary Care Physician:  Leonie Douglas, MD Primary Gastroenterologist:  Dr. Gala Romney  Pre-Procedure History & Physical: HPI:  Anthony Vaughn is a 52 y.o. male here for history of gastric ulcer with multiple duodenal strictures.  Felt to be NSAID related;  status post balloon dilation previously.  Biopsies negative for malignancy and H. pylori.  He has some early satiety and intermittent paper hematochezia consistent with hemorrhoids based on recent colonoscopy.  Having right inguinal hernia repair in the near future in [place].   Has gained 3 pounds since his last office visit here.  Does use Zofran and Carafate on a regular basis for symptoms consistent with early satiety and nausea.  Continues on twice daily PPI therapy.  Denies NSAIDs. Past Medical History:  Diagnosis Date   Arthritis    CAD (coronary artery disease)    Chronic systolic heart failure (HCC)    Diverticulitis    s/p partial colectomy due to stenotic sigmoind colon   Dyspnea    Dysrhythmia    GERD (gastroesophageal reflux disease)    HTN (hypertension)    Myocardial infarction ([place]) 2021   Panic attacks    SVT (supraventricular tachycardia)     Past Surgical History:  Procedure Laterality Date   BIOPSY  03/04/2019   Procedure: BIOPSY;  Surgeon: Daneil Dolin, MD;  [facility]: [facility];  Service: Endoscopy;;  gastric   BIOPSY  12/01/2021   Procedure: BIOPSY;  Surgeon: Daneil Dolin, MD;  [facility]: [facility];  Service: Endoscopy;;   COLONOSCOPY     COLONOSCOPY WITH PROPOFOL N/A 03/04/2019   Surgeon: Daneil Dolin, MD;  Diverticulosis in the entire colon with a stenotic, stiff, chronically inflamed sigmoid segment.  Low threshold for surgical consultation.  Grade 1 internal hemorrhoids. Repeat in 10 years.   COLONOSCOPY WITH PROPOFOL N/A 12/01/2021   Procedure: COLONOSCOPY WITH PROPOFOL;  Surgeon: Daneil Dolin, MD;  [facility]: [facility];  Service: Endoscopy;  Laterality: N/A;  12:45 pm    ESOPHAGOGASTRODUODENOSCOPY (EGD) WITH PROPOFOL N/A 03/04/2019   Surgeon: Daneil Dolin, MD;  gastric mucosal changes found to be chronic gastritis and focal intestinal metaplasia negative for H. 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   ESOPHAGOGASTRODUODENOSCOPY (EGD) WITH PROPOFOL N/A 12/01/2021   Procedure: ESOPHAGOGASTRODUODENOSCOPY (EGD) WITH PROPOFOL;  Surgeon: Daneil Dolin, MD;  [facility];  Service: Endoscopy;  Laterality: N/A;   LAPAROSCOPIC PARTIAL COLECTOMY N/A 07/14/2019   Procedure: LAPAROSCOPIC  LEFT HEMICOLECTOMY;  Surgeon: Virl Cagey, MD;  Location: [facility];  Service: General;  Laterality: N/A;    Prior to Admission medications   Medication Sig Start Date End Date Taking? Authorizing Provider  amLODipine (NORVASC) 10 MG tablet Take 1 tablet (10 mg total) by mouth daily. For Blood Pressure 07/17/19  Yes Emokpae, Courage, MD  amoxicillin-clavulanate (AUGMENTIN) 875-125 MG tablet 1 tablet Orally every 12 hrs for 10 days 02/01/22  Yes [provider]  aspirin EC 81 MG tablet Take 1 tablet (81 mg total) by mouth daily. Swallow whole. 10/18/20  Yes Verta Ellen., NP  azithromycin (ZITHROMAX) 250 MG tablet as directed: 2 tabs on day 1, then take 1 tab daily Orally daily for 5 days 02/09/22  Yes [provider]  docusate sodium (COLACE) 100 MG capsule Take 100 mg by mouth daily as needed for mild constipation.   Yes [provider]  ferrous sulfate 325 (65 FE) MG tablet 1  tablet with meal Orally daily for 30 day(s) 09/15/21  Yes [provider]  metoprolol succinate (TOPROL-XL) 100 MG 24 hr tablet Take 1 tablet (100 mg total) by mouth daily. Take with or immediately following a meal. 12/07/21  Yes Branch, Dorothe Pea, MD  ondansetron (ZOFRAN) 4 MG tablet Take 4 mg by mouth every 8 (eight) hours as needed for nausea or vomiting.   Yes [provider]  pantoprazole  (PROTONIX) 40 MG tablet Take 1 tablet (40 mg total) by mouth 2 (two) times daily before a meal. 09/28/21 09/28/22 Yes Harper, Kristen S, PA-C  polyethylene glycol (MIRALAX / GLYCOLAX) 17 g packet Take 17 g by mouth daily.   Yes [provider]  predniSONE (DELTASONE) 20 MG tablet Take 3 tablets by mouth on first day and 1 tablet twice daily for 5 days Orally Twice a day for 6 days 02/09/22  Yes [provider]  rosuvastatin (CRESTOR) 20 MG tablet Take 1 tablet (20 mg total) by mouth daily. 01/05/22  Yes Branch, Dorothe Pea, MD  sacubitril-valsartan (ENTRESTO) 24-26 MG Take 1 tablet by mouth 2 (two) times daily. 01/05/22  Yes BranchDorothe Pea, MD  spironolactone (ALDACTONE) 25 MG tablet Take 0.5 tablets (12.5 mg total) by mouth daily. 10/21/21  Yes Branch, Dorothe Pea, MD  sucralfate (CARAFATE) 1 GM/10ML suspension Take 10 mLs (1 g total) by mouth 4 (four) times daily. 11/16/21 11/16/22 Yes Ermalinda Memos S, PA-C    Allergies as of 02/14/2022 - Review Complete 02/14/2022  Allergen Reaction Noted   Other  02/04/2019    Family History  Problem Relation Age of Onset   Lung cancer Father    Colon cancer Neg Hx    Colon polyps Neg Hx     Social History   Socioeconomic History   Marital status: Single    Spouse name: Not on file   Number of children: Not on file   Years of education: Not on file   Highest education level: Not on file  Occupational History   Not on file  Tobacco Use   Smoking status: Every Day    Packs/day: 0.25    Years: 25.00    Total pack years: 6.25    Types: Cigarettes   Smokeless tobacco: Never  Vaping Use   Vaping Use: Never used  Substance and Sexual Activity   Alcohol use: No   Drug use: No   Sexual activity: Yes  Other Topics Concern   Not on file  Social History Narrative   Not on file   Social Determinants of Health   Financial Resource Strain: Not on file  Food Insecurity: Not on file  Transportation Needs: Not on file   Physical Activity: Not on file  Stress: Not on file  Social Connections: Not on file  Intimate Partner Violence: Not on file    Review of Systems: See HPI, otherwise negative ROS  Physical Exam: BP (!) 148/87 (BP Location: Right Arm, Patient Position: Sitting, Cuff Size: Large)   Pulse 63   Temp 98 F (36.7 C) (Oral)   Ht 6' (1.829 m)   Wt 162 lb 3.2 oz (73.6 kg)   SpO2 99%   BMI 22.00 kg/m  General:   Alert,  Well-developed, well-nourished, pleasant and cooperative in NAD Neck:  Supple; no masses or thyromegaly. No significant cervical adenopathy. Lungs:  Clear throughout to auscultation.   No wheezes, crackles, or rhonchi. No acute distress. Heart:  Regular rate and rhythm; no murmurs, clicks, rubs,  or gallops. Abdomen: Non-distended, normal bowel sounds.  Soft and nontender without appreciable mass or hepatosplenomegaly.  Pulses:  Normal pulses noted. Extremities:  Without clubbing or edema.  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  Status post balloon dilation.  He needs to have a repeat EGD in possible further stricture dilation.  He had an elongated stomach and would be best served by undergoing EGD with the a  pediatric colonoscope.  Hematochezia secondary to hemorrhoids.  He may be a good banding candidate.  Upcoming right inguinal hernia surgery.  Blood pressure up today.  Will be rechecked.   Recommendations:  Continue to avoid all nonsteroidals including ibuprofen, etc.  CBC, HP serologies today  As discussed, we will schedule a repeat EGD with stricture dilation as appropriate in the near future.  ASA 3.The risks, benefits, limitations, alternatives and imponderables have been reviewed with the patient. Potential for esophageal dilation, biopsy, etc. have also been reviewed.  Questions have been answered. All parties agreeable.   Pamphlet on hemorrhoid banding provided.  Office visit  with [place] in 2 months.       Notice: This dictation was prepared with Dragon dictation along with smaller phrase technology. Any transcriptional errors that result from this process are unintentional and may not be corrected upon review.

## 2022-02-16 ENCOUNTER — Telehealth: Payer: Self-pay | Admitting: Cardiology

## 2022-02-16 ENCOUNTER — Other Ambulatory Visit: Payer: Self-pay | Admitting: *Deleted

## 2022-02-16 DIAGNOSIS — Z79899 Other long term (current) drug therapy: Secondary | ICD-10-CM

## 2022-02-16 DIAGNOSIS — I5022 Chronic systolic (congestive) heart failure: Secondary | ICD-10-CM

## 2022-02-16 NOTE — Telephone Encounter (Signed)
   Pre-operative Risk Assessment    Patient Name: Anthony Vaughn  DOB: 1970/09/17 MRN: [id number]{     Request for Surgical Clearance    Procedure:   RT INGUINAL HERNIA  Date of Surgery:  Clearance 03/10/22                                 Surgeon:  DR Adelina Mings Surgeon's Group or Practice Name:  [facility] Phone number:  [phone] Fax number:  [phone]   Type of Clearance Requested:   - Medical    Type of Anesthesia:  General    Additional requests/questions:    SignedDesma Paganini   02/16/2022, 4:32 PM

## 2022-02-17 NOTE — Telephone Encounter (Signed)
Primary Cardiologist:Vaughn, Anthony Palau, MD  Chart reviewed as part of pre-operative protocol coverage. Because of Anthony Vaughn's past medical history and time since last visit, he/she will require a follow-up visit in order to better assess preoperative cardiovascular risk.  Pre-op covering staff: - Patient missed his follow-up appointment in December. Has appointment with Dr. Harl Bowie 03/08/22. He may either keep that appointment or try to be seen sooner in order to ensure no cancellation of surgery on 2/2.  - Please contact requesting surgeon's office via preferred method (i.e, phone, fax) to inform them of need for appointment prior to surgery.  Per office protocol, he may hold aspirin for 5-7 days prior to procedure and should resume as soon as hemodynamically stable postoperatively.  Anthony Life, NP-C  02/17/2022, 8:53 AM [address], [address] Office [phone] Fax [phone]

## 2022-02-17 NOTE — Telephone Encounter (Signed)
Pt has been scheduled to see Finis Bud, NP, 02/21/22 1:30, clearance will be addressed at that time.  Will route to the requesting surgeon's office to make them aware.

## 2022-02-21 ENCOUNTER — Encounter: Payer: Self-pay | Admitting: Nurse Practitioner

## 2022-02-21 ENCOUNTER — Telehealth: Payer: Self-pay | Admitting: Nurse Practitioner

## 2022-02-21 ENCOUNTER — Ambulatory Visit: Payer: Medicaid Other | Attending: Nurse Practitioner | Admitting: Nurse Practitioner

## 2022-02-21 VITALS — BP 150/99 | HR 68 | Ht 72.0 in | Wt 165.8 lb

## 2022-02-21 DIAGNOSIS — R002 Palpitations: Secondary | ICD-10-CM

## 2022-02-21 DIAGNOSIS — I5022 Chronic systolic (congestive) heart failure: Secondary | ICD-10-CM | POA: Diagnosis not present

## 2022-02-21 DIAGNOSIS — R072 Precordial pain: Secondary | ICD-10-CM

## 2022-02-21 DIAGNOSIS — I10 Essential (primary) hypertension: Secondary | ICD-10-CM

## 2022-02-21 DIAGNOSIS — I25119 Atherosclerotic heart disease of native coronary artery with unspecified angina pectoris: Secondary | ICD-10-CM | POA: Diagnosis not present

## 2022-02-21 DIAGNOSIS — Z79899 Other long term (current) drug therapy: Secondary | ICD-10-CM

## 2022-02-21 DIAGNOSIS — E785 Hyperlipidemia, unspecified: Secondary | ICD-10-CM

## 2022-02-21 DIAGNOSIS — R0609 Other forms of dyspnea: Secondary | ICD-10-CM

## 2022-02-21 DIAGNOSIS — Z01810 Encounter for preprocedural cardiovascular examination: Secondary | ICD-10-CM | POA: Diagnosis not present

## 2022-02-21 MED ORDER — SPIRONOLACTONE 25 MG PO TABS: 25.0000 mg | ORAL_TABLET | Freq: Every day | ORAL | 3 refills | Status: DC

## 2022-02-21 NOTE — Patient Instructions (Addendum)
Medication Instructions:   INCREASE Spironolactone to 25 mg daily   *If you need a refill on your cardiac medications before your next appointment, please call your pharmacy*   Lab Work: Your physician recommends that you return for lab work in 2 weeks:  BMP  If you have labs (blood work) drawn today and your tests are completely normal, you will receive your results only by: [place] (if you have MyChart) OR A paper copy in the mail If you have any lab test that is abnormal or we need to change your treatment, we will call you to review the results.   Testing/Procedures: Your physician has requested that you have an echocardiogram. Echocardiography is a painless test that uses sound waves to create images of your heart. It provides your doctor with information about the size and shape of your heart and how well your heart's chambers and valves are working. This procedure takes approximately one hour. There are no restrictions for this procedure. Please do NOT wear cologne, perfume, aftershave, or lotions (deodorant is allowed). Please arrive 15 minutes prior to your appointment time.  Your physician has requested that you have a lexiscan myoview. For further information please visit [email or web address]. Please follow instruction sheet, as given.  Follow-Up: At [facility], you and your health needs are our priority.  As part of our continuing mission to provide you with exceptional heart care, we have created designated Provider Care Teams.  These Care Teams include your primary Cardiologist (physician) and Advanced Practice Providers (APPs -  Physician Assistants and Nurse Practitioners) who all work together to provide you with the care you need, when you need it.  Your next appointment:   As previously scheduled 03/08/22   Provider:   Carlyle Dolly, MD    Other Instructions Monitor blood pressure at home daily.

## 2022-02-21 NOTE — Telephone Encounter (Signed)
Checking percert on the following patient for testing scheduled at [facility].    LEXISCAN   03/02/2022  ECHO            03/06/2022

## 2022-02-21 NOTE — Progress Notes (Signed)
Cardiology Office Note:    Date: 02/21/2022  ID:  [name], DOB [birth year], MRN [id number]  PCP:  Leonie Douglas, [facility] Cardiologist:  Carlyle Dolly, MD     Referring MD: Leonie Douglas, MD   CC: Here for preoperative cardiovascular risk assessment  History of Present Illness:    Anthony Vaughn is a 52 y.o. male with a hx of the following:    Chronic HFmrEF CAD Palpitations/SVT Hypertension Hyperlipidemia History of AKI History of diverticulitis Abdominal pain/nausea  Previous cardiovascular history includes echocardiogram in 2021 that showed mildly reduced EF at 40 to 45%, inferior wall akinetic, basal inferolateral and apical inferior hypokinetic.  NST in 2021 showed large inferior infarct, no ischemia.  Repeat echocardiogram in July 2023 showed similar EF, grade 2 DD, akinetic inferior wall with basal inferolateral segment and apical inferior segment is hypokinetic.  Was started on Entresto.  Follow-up labs showed significant AKI with creatinine up to 2.25.  Due to bump in creatinine, Entresto was lowered to low-dose 24/26 mg twice daily.  History of ED visit for SVT, given rounds of adenosine.  Developed some hypotension with this, required cardioversion.  After cardioversion it was discussed to transfer him to [facility] but patient signed out AMA.  Last seen by Dr. Carlyle Dolly in office on December 07, 2021.  Toprol was increased to 100 mg daily for mild palpitations at times.  Was told to follow-up in 6 weeks, it was suggested to address his Entresto, Aldactone, and consider SGLT2 inhibitor, depending on his labs.  However, it appears that he did not show up for follow-up visit in 6 weeks.  Patient is a very pleasant 52 year old male with past medical history as mentioned above.  Our office was contacted last week for preoperative cardiovascular risk assessment.  Patient is pending right inguinal hernia repair that  will be performed on March 10, 2022 by Dr. Adelina Mings of [facility].  We have been contacted regarding medical clearance.  Today he presents for follow-up. CC: Burning chest pain noticed recently, not sure if it is indigestion related.  Diagnosed with pneumonia around  a few weeks ago, has been getting over this.  Chest pain located along lower sternum, nonradiating, lasting few seconds in duration and intermittent.  Unsure of aggravating and alleviating factors.  Does have some shortness of breath on exertion, seems to be worse since 1 year ago.  Denies any palpitations, syncope, presyncope, dizziness, orthopnea, PND, swelling or significant weight changes, acute bleeding, or claudication.  Says he is not as active like he used to be due to his breathing.  BP is well-controlled at home.  Denies any other questions or concerns today.  Past Medical History:  Diagnosis Date   Arthritis    CAD (coronary artery disease)    Chronic systolic heart failure (HCC)    Diverticulitis    s/p partial colectomy due to stenotic sigmoind colon   Dyspnea    Dysrhythmia    GERD (gastroesophageal reflux disease)    HTN (hypertension)    Myocardial infarction ([place]) 2021   Panic attacks    SVT (supraventricular tachycardia)     Past Surgical History:  Procedure Laterality Date   BIOPSY  03/04/2019   Procedure: BIOPSY;  Surgeon: Daneil Dolin, MD;  [facility];  Service: Endoscopy;;  gastric   BIOPSY  12/01/2021   Procedure: BIOPSY;  Surgeon: Daneil Dolin, MD;  [facility];  Service:  Endoscopy;;   COLONOSCOPY     COLONOSCOPY WITH PROPOFOL N/A 03/04/2019   Surgeon: Corbin Ade, MD;  Diverticulosis in the entire colon with a stenotic, stiff, chronically inflamed sigmoid segment.  Low threshold for surgical consultation.  Grade 1 internal hemorrhoids. Repeat in 10 years.   COLONOSCOPY WITH PROPOFOL N/A 12/01/2021   Procedure: COLONOSCOPY WITH PROPOFOL;   Surgeon: Corbin Ade, MD;  [facility];  Service: Endoscopy;  Laterality: N/A;  12:45 pm   ESOPHAGOGASTRODUODENOSCOPY (EGD) WITH PROPOFOL N/A 03/04/2019   Surgeon: Corbin Ade, MD;  gastric mucosal changes found to be chronic gastritis and focal intestinal metaplasia negative for H. 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   ESOPHAGOGASTRODUODENOSCOPY (EGD) WITH PROPOFOL N/A 12/01/2021   Procedure: ESOPHAGOGASTRODUODENOSCOPY (EGD) WITH PROPOFOL;  Surgeon: Corbin Ade, MD;  [facility];  Service: Endoscopy;  Laterality: N/A;   LAPAROSCOPIC PARTIAL COLECTOMY N/A 07/14/2019   Procedure: LAPAROSCOPIC  LEFT HEMICOLECTOMY;  Surgeon: Lucretia Roers, MD;  Location: [facility];  Service: General;  Laterality: N/A;    Current Medications: Current Meds  Medication Sig   amLODipine (NORVASC) 10 MG tablet Take 1 tablet (10 mg total) by mouth daily. For Blood Pressure   aspirin EC 81 MG tablet Take 1 tablet (81 mg total) by mouth daily. Swallow whole.   docusate sodium (COLACE) 100 MG capsule Take 100 mg by mouth daily as needed for mild constipation.   ferrous sulfate 325 (65 FE) MG tablet 1 tablet with meal Orally daily for 30 day(s)   metoprolol succinate (TOPROL-XL) 100 MG 24 hr tablet Take 1 tablet (100 mg total) by mouth daily. Take with or immediately following a meal.   ondansetron (ZOFRAN) 4 MG tablet Take 4 mg by mouth every 8 (eight) hours as needed for nausea or vomiting.   pantoprazole (PROTONIX) 40 MG tablet Take 1 tablet (40 mg total) by mouth 2 (two) times daily before a meal.   polyethylene glycol (MIRALAX / GLYCOLAX) 17 g packet Take 17 g by mouth daily.   rosuvastatin (CRESTOR) 20 MG tablet Take 1 tablet (20 mg total) by mouth daily.   sacubitril-valsartan (ENTRESTO) 24-26 MG Take 1 tablet by mouth 2 (two) times daily.   sucralfate (CARAFATE) 1 GM/10ML suspension Take 10 mLs (1 g total) by mouth  4 (four) times daily.   [DISCONTINUED] spironolactone (ALDACTONE) 25 MG tablet Take 0.5 tablets (12.5 mg total) by mouth daily.     Allergies:   Other   Social History   Socioeconomic History   Marital status: Single    Spouse name: Not on file   Number of children: Not on file   Years of education: Not on file   Highest education level: Not on file  Occupational History   Not on file  Tobacco Use   Smoking status: Every Day    Packs/day: 0.25    Years: 25.00    Total pack years: 6.25    Types: Cigarettes   Smokeless tobacco: Never  Vaping Use   Vaping Use: Never used  Substance and Sexual Activity   Alcohol use: No   Drug use: No   Sexual activity: Yes  Other Topics Concern   Not on file  Social History Narrative   Not on file   Social Determinants of Health   Financial Resource Strain: Not on file  Food Insecurity: Not on file  Transportation Needs: Not on file  Physical  Activity: Not on file  Stress: Not on file  Social Connections: Not on file     Family History: The patient's family history includes Lung cancer in his father. There is no history of Colon cancer or Colon polyps.  ROS:   Review of Systems  Constitutional:  Negative for chills, diaphoresis, fever, malaise/fatigue and weight loss.  HENT: Negative.    Eyes: Negative.   Respiratory:  Positive for shortness of breath. Negative for cough, hemoptysis, sputum production and wheezing.        See HPI.   Cardiovascular:  Positive for chest pain. Negative for palpitations, orthopnea, claudication, leg swelling and PND.       See HPI.   Gastrointestinal: Negative.   Genitourinary: Negative.   Musculoskeletal: Negative.   Skin: Negative.   Neurological: Negative.   Endo/Heme/Allergies: Negative.   Psychiatric/Behavioral: Negative.      Please see the history of present illness.    All other systems reviewed and are negative.  EKGs/Labs/Other Studies Reviewed:    The following studies were  reviewed today:   EKG:  EKG is ordered today.  The ekg ordered today demonstrates sinus rhythm, 68 bpm, LVH with repolarization abnormality, no acute ischemic changes.   Echo 08/15/2021: 1. Left ventricular ejection fraction, by estimation, is 40 to 45%. The  left ventricle has mildly decreased function. The left ventricle  demonstrates global hypokinesis. There is moderate left ventricular  hypertrophy. Left ventricular diastolic  parameters are consistent with Grade I diastolic dysfunction (impaired  relaxation). The average left ventricular global longitudinal strain is  -13.3 %. The global longitudinal strain is abnormal.   2. Right ventricular systolic function is normal. The right ventricular  size is normal. Tricuspid regurgitation signal is inadequate for assessing  PA pressure.   3. Left atrial size was mildly dilated.   4. The mitral valve is abnormal. Mild mitral valve regurgitation. No  evidence of mitral stenosis.   5. The aortic valve is tricuspid. Aortic valve regurgitation is not  visualized. No aortic stenosis is present.   6. Aortic dilatation noted. There is mild dilatation of the aortic root,  measuring 40 mm.   7. The inferior vena cava is normal in size with greater than 50%  respiratory variability, suggesting right atrial pressure of 3 mmHg.   Comparison(s): Echocardiogram done 07/15/19 showed an EF of 40-45%.   Myoview on 08/14/2019: There was no ST segment deviation noted during stress. Findings consistent with large prior inferior/inferoseptal myocardial infarction. This is a high risk study. Risk is based on decreased LVEF, there is prior infarct without myocardium currently at jeopardy. Consider correlating LVEF with echo to better assess risk. The left ventricular ejection fraction is moderately decreased (30%).   Recent Labs: 05/23/2021: ALT 10 11/29/2021: Hemoglobin 13.9; Platelets 288 12/19/2021: Magnesium 1.9 01/23/2022: BUN 17; Creatinine, Ser  1.62; Potassium 4.3; Sodium 136  Recent Lipid Panel    Component Value Date/Time   CHOL 172 12/19/2021 0834   TRIG 21 12/19/2021 0834   HDL 77 12/19/2021 0834   CHOLHDL 2.2 12/19/2021 0834   VLDL 4 12/19/2021 0834   LDLCALC 91 12/19/2021 0834     Risk Assessment/Calculations:    HYPERTENSION CONTROL Vitals:   02/21/22 1340 02/21/22 1345  BP: (!) 154/100 (!) 150/99    The patient's blood pressure is elevated above target today.  In order to address the patient's elevated BP: Blood pressure will be monitored at home to determine if medication changes need to be made.; A  current anti-hypertensive medication was adjusted today.; Follow up with general cardiology has been recommended.          The 10-year ASCVD risk score (Arnett DK, et al., 2019) is: 15.8%   Values used to calculate the score:     Age: 8 years     Sex: Male     Is Non-Hispanic African American: Yes     Diabetic: No     Tobacco smoker: Yes     Systolic Blood Pressure: [id number] mmHg     Is BP treated: Yes     HDL Cholesterol: 77 mg/dL     Total Cholesterol: 172 mg/dL   Physical Exam:    VS:  BP (!) 150/99 (BP Location: Left Arm, Patient Position: Sitting, Cuff Size: Normal)   Pulse 68   Ht 6' (1.829 m)   Wt 165 lb 12.8 oz (75.2 kg)   BMI 22.49 kg/m     Wt Readings from Last 3 Encounters:  02/21/22 165 lb 12.8 oz (75.2 kg)  02/14/22 162 lb 3.2 oz (73.6 kg)  12/07/21 158 lb 12.8 oz (72 kg)     GEN: Well nourished, well developed in no acute distress HEENT: Normal NECK: No JVD; No carotid bruits CARDIAC: S1/S2, RRR, no murmurs, rubs, gallops; 2+ peripheral pulses RESPIRATORY:  Clear to auscultation without rales, wheezing or rhonchi  MUSCULOSKELETAL:  No edema; No deformity  SKIN: Warm and dry NEUROLOGIC:  Alert and oriented x 3 PSYCHIATRIC:  Normal affect   ASSESSMENT:    1. Pre-operative cardiovascular examination   2. Coronary artery disease involving native heart with angina pectoris,  unspecified vessel or lesion type ([place])   3. Precordial pain   4. Heart failure with mildly reduced ejection fraction (HFmrEF) ([place])   5. DOE (dyspnea on exertion)   6. Palpitations   7. Hypertension, unspecified type   8. Medication management   9. Hyperlipidemia, unspecified hyperlipidemia type    PLAN:    In order of problems listed above:  Pre-operative cardiovascular risk assessment Mr. Pinchback perioperative risk of a major cardiac event is 0.9% according to the Revised Cardiac Risk Index (RCRI).  Therefore, he is at low risk for perioperative complications.   His functional capacity is fair at 4.64 METs according to the Duke Activity Status Index (DASI). Recommendations: The patient requires an echocardiogram before a disposition can be made regarding surgical risk.                Antiplatelet and/or Anticoagulation Recommendations: Prefer Aspirin to be continued peri-operatively unless surgeon feels bleeding risk is too high. If surgeon feels as though bleeding risk is too high, may hold Aspirin 7 days prior to procedure. Once Echocardiogram and Myoview results are WNL, will addendum note and route note to requesting party.   2. CAD, Chest pain Atypical chest pain, etiology unclear. NST in 2021 revealed large inferior infarct, no ischemia. Will arrange Lexiscan to evaluate for any findings of ischemia. Risks and benefits discussed with patient and he is agreeable to proceed. EKG reassuring today. Continue ASA, Toprol XL, and Rosuvastatin.Heart healthy diet and regular cardiovascular exercise encouraged. ED precautions discussed.   Shared Decision Making/Informed Consent The risks [chest pain, shortness of breath, cardiac arrhythmias, dizziness, blood pressure fluctuations, myocardial infarction, stroke/transient ischemic attack, nausea, vomiting, allergic reaction, radiation exposure, metallic taste sensation and life-threatening complications (estimated to be 1 in 10,000)],  benefits (risk stratification, diagnosing coronary artery disease, treatment guidance) and alternatives of a nuclear stress test were discussed in  detail with Mr. Zempel and he agrees to proceed.   3. Chronic HFmrEF, DOE Echo 08/2021 revealed mildly decreased at 40-45%, was unable to tolerate middle dosage of Entresto due to elevated sCr. Euvolemic and well compensated on exam, however does admit to more noticeable shortness of breath with exertion. Will update Echocardiogram as mentioned above. Continue Toprol XL, Entresto, and Aldactone. Increasing Aldactone. Consider SGLT2i at follow-up. Low sodium diet, fluid restriction <2L, and daily weights encouraged. Educated to contact our office for weight gain of 2 lbs overnight or 5 lbs in one week. Heart healthy diet and regular cardiovascular exercise encouraged.   4. Palpitations/SVT Denies any tachycardia or palpitations. Continue Toprol XL. Heart healthy diet and regular cardiovascular exercise encouraged.   5. HTN, medication management BP on arrival 154/100, repeat BP 150/99. Discussed SBP goal < 130. Discussed to monitor BP at home at least 2 hours after medications and sitting for 5-10 minutes. Increase Aldactone to 25 mg daily and will check BMET in 2 weeks. Continue Entresto and Toprol XL. Heart healthy diet and regular cardiovascular exercise encouraged.   6. HLD Lipid panel 12/2021 was unremarkable. Continue rosuvastatin. Heart healthy diet and regular cardiovascular exercise encouraged.   7. Disposition: Follow-up with Dr. Carlyle Dolly as scheduled or sooner if anything changes.   Medication Adjustments/Labs and Tests Ordered: Current medicines are reviewed at length with the patient today.  Concerns regarding medicines are outlined above.  Orders Placed This Encounter  Procedures   NM Myocar Multi W/Spect W/Wall Motion / EF   Basic metabolic panel   EKG [id number]   ECHOCARDIOGRAM COMPLETE   Meds ordered this encounter   Medications   spironolactone (ALDACTONE) 25 MG tablet    Sig: Take 1 tablet (25 mg total) by mouth daily.    Dispense:  90 tablet    Refill:  3    Dose change new Rx    Patient Instructions  Medication Instructions:   INCREASE Spironolactone to 25 mg daily   *If you need a refill on your cardiac medications before your next appointment, please call your pharmacy*   Lab Work: Your physician recommends that you return for lab work in 2 weeks:  BMP  If you have labs (blood work) drawn today and your tests are completely normal, you will receive your results only by: [place] (if you have MyChart) OR A paper copy in the mail If you have any lab test that is abnormal or we need to change your treatment, we will call you to review the results.   Testing/Procedures: Your physician has requested that you have an echocardiogram. Echocardiography is a painless test that uses sound waves to create images of your heart. It provides your doctor with information about the size and shape of your heart and how well your heart's chambers and valves are working. This procedure takes approximately one hour. There are no restrictions for this procedure. Please do NOT wear cologne, perfume, aftershave, or lotions (deodorant is allowed). Please arrive 15 minutes prior to your appointment time.  Your physician has requested that you have a lexiscan myoview. For further information please visit [email or web address]. Please follow instruction sheet, as given.  Follow-Up: At [facility], you and your health needs are our priority.  As part of our continuing mission to provide you with exceptional heart care, we have created designated Provider Care Teams.  These Care Teams include your primary Cardiologist (physician) and Advanced Practice Providers (APPs -  Physician Assistants and Nurse Practitioners)  who all work together to provide you with the care you need, when you need it.  Your  next appointment:   As previously scheduled 03/08/22   Provider:   Carlyle Dolly, MD    Other Instructions Monitor blood pressure at home daily.       Signed, Finis Bud, NP  02/22/2022 8:16 PM    [place]

## 2022-02-22 ENCOUNTER — Encounter: Payer: Self-pay | Admitting: Nurse Practitioner

## 2022-02-26 NOTE — Progress Notes (Incomplete)
H&P  Chief Complaint: ***  History of Present Illness: ***  Past Medical History:  Diagnosis Date   Arthritis    CAD (coronary artery disease)    Chronic systolic heart failure (HCC)    Diverticulitis    s/p partial colectomy due to stenotic sigmoind colon   Dyspnea    Dysrhythmia    GERD (gastroesophageal reflux disease)    HTN (hypertension)    Myocardial infarction ([place]) 2021   Panic attacks    SVT (supraventricular tachycardia)     Past Surgical History:  Procedure Laterality Date   BIOPSY  03/04/2019   Procedure: BIOPSY;  Surgeon: Daneil Dolin, MD;  [facility];  Service: Endoscopy;;  gastric   BIOPSY  12/01/2021   Procedure: BIOPSY;  Surgeon: Daneil Dolin, MD;  [facility];  Service: Endoscopy;;   COLONOSCOPY     COLONOSCOPY WITH PROPOFOL N/A 03/04/2019   Surgeon: Daneil Dolin, MD;  Diverticulosis in the entire colon with a stenotic, stiff, chronically inflamed sigmoid segment.  Low threshold for surgical consultation.  Grade 1 internal hemorrhoids. Repeat in 10 years.   COLONOSCOPY WITH PROPOFOL N/A 12/01/2021   Procedure: COLONOSCOPY WITH PROPOFOL;  Surgeon: Daneil Dolin, MD;  [facility];  Service: Endoscopy;  Laterality: N/A;  12:45 pm   ESOPHAGOGASTRODUODENOSCOPY (EGD) WITH PROPOFOL N/A 03/04/2019   Surgeon: Daneil Dolin, MD;  gastric mucosal changes found to be chronic gastritis and focal intestinal metaplasia negative for H. 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   ESOPHAGOGASTRODUODENOSCOPY (EGD) WITH PROPOFOL N/A 12/01/2021   Procedure: ESOPHAGOGASTRODUODENOSCOPY (EGD) WITH PROPOFOL;  Surgeon: Daneil Dolin, MD;  [facility];  Service: Endoscopy;  Laterality: N/A;   LAPAROSCOPIC PARTIAL COLECTOMY N/A 07/14/2019   Procedure: LAPAROSCOPIC  LEFT HEMICOLECTOMY;  Surgeon: Virl Cagey, MD;  Location: [facility];  Service: General;  Laterality:  N/A;    Home Medications:  Allergies as of 02/28/2022       Reactions   Other    States he's allergic to a blood pressure pill that causes abdominal pain. He's not sure name of medication        Medication List        Accurate as of February 26, 2022 11:31 AM. If you have any questions, ask your nurse or doctor.          amLODipine 10 MG tablet Commonly known as: NORVASC Take 1 tablet (10 mg total) by mouth daily. For Blood Pressure   amoxicillin-clavulanate 875-125 MG tablet Commonly known as: AUGMENTIN 1 tablet Orally every 12 hrs for 10 days   aspirin EC 81 MG tablet Take 1 tablet (81 mg total) by mouth daily. Swallow whole.   azithromycin 250 MG tablet Commonly known as: ZITHROMAX as directed: 2 tabs on day 1, then take 1 tab daily Orally daily for 5 days   docusate sodium 100 MG capsule Commonly known as: COLACE Take 100 mg by mouth daily as needed for mild constipation.   Entresto 24-26 MG Generic drug: sacubitril-valsartan Take 1 tablet by mouth 2 (two) times daily.   ferrous sulfate 325 (65 FE) MG tablet 1 tablet with meal Orally daily for 30 day(s)   metoprolol succinate 100 MG 24 hr tablet Commonly known as: TOPROL-XL Take 1 tablet (100 mg total) by mouth daily. Take with or immediately following a meal.   ondansetron 4 MG tablet Commonly known as: ZOFRAN Take  4 mg by mouth every 8 (eight) hours as needed for nausea or vomiting.   pantoprazole 40 MG tablet Commonly known as: Protonix Take 1 tablet (40 mg total) by mouth 2 (two) times daily before a meal.   polyethylene glycol 17 g packet Commonly known as: MIRALAX / GLYCOLAX Take 17 g by mouth daily.   predniSONE 20 MG tablet Commonly known as: DELTASONE Take 3 tablets by mouth on first day and 1 tablet twice daily for 5 days Orally Twice a day for 6 days   rosuvastatin 20 MG tablet Commonly known as: CRESTOR Take 1 tablet (20 mg total) by mouth daily.   spironolactone 25 MG  tablet Commonly known as: ALDACTONE Take 1 tablet (25 mg total) by mouth daily.   sucralfate 1 GM/10ML suspension Commonly known as: Carafate Take 10 mLs (1 g total) by mouth 4 (four) times daily.        Allergies:  Allergies  Allergen Reactions   Other     States he's allergic to a blood pressure pill that causes abdominal pain. He's not sure name of medication    Family History  Problem Relation Age of Onset   Lung cancer Father    Colon cancer Neg Hx    Colon polyps Neg Hx     Social History:  reports that he has been smoking cigarettes. He has a 6.25 pack-year smoking history. He has never used smokeless tobacco. He reports that he does not drink alcohol and does not use drugs.  ROS: A complete review of systems was performed.  All systems are negative except for pertinent findings as noted.  Physical Exam:  Vital signs in last 24 hours: There were no vitals taken for this visit. Constitutional:  Alert and oriented, No acute distress Cardiovascular: Regular rate  Respiratory: Normal respiratory effort GI: Abdomen is soft, nontender, nondistended, no abdominal masses. No CVAT.  Genitourinary: Normal male phallus, testes are descended bilaterally and non-tender and without masses, scrotum is normal in appearance without lesions or masses, perineum is normal on inspection. Lymphatic: No lymphadenopathy Neurologic: Grossly intact, no focal deficits Psychiatric: Normal mood and affect  I have reviewed prior pt notes  I have reviewed notes from referring/previous physicians  I have reviewed urinalysis results  I have independently reviewed prior imaging  I have reviewed prior PSA results  I have reviewed prior urine culture   Impression/Assessment:  ***  Plan:  ***

## 2022-02-27 NOTE — Patient Instructions (Signed)
Khoa Opdahl  02/27/2022     @PREFPERIOPPHARMACY @   Your procedure is scheduled on  03/03/2022.   Report to [facility] at  [facility]   Call this number if you have problems the morning of surgery:  [phone]  If you experience any cold or flu symptoms such as cough, fever, chills, shortness of breath, etc. between now and your scheduled surgery, please notify us at the above number.   Remember:  Follow the diet instructions given to you bu the office.     Take these medicines the morning of surgery with A SIP OF WATER              amlodipine, metoprolol, zofran (if needed), pantoprazole, entresto.     Do not wear jewelry, make-up or nail polish.  Do not wear lotions, powders, or perfumes, or deodorant.  Do not shave 48 hours prior to surgery.  Men may shave face and neck.  Do not bring valuables to the hospital.  [facility] is not responsible for any belongings or valuables.  Contacts, dentures or bridgework may not be worn into surgery.  Leave your suitcase in the car.  After surgery it may be brought to your room.  For patients admitted to the hospital, discharge time will be determined by your treatment team.  Patients discharged the day of surgery will not be allowed to drive home and must have someone with them for 24 hours.    Special instructions:   DO NOT smoke tobacco or vape for 24 hours before your procedure.  Please read over the following fact sheets that you were given. Anesthesia Post-op Instructions and Care and Recovery After Surgery      Upper Endoscopy, Adult, Care After After the procedure, it is common to have a sore throat. It is also common to have: Mild stomach pain or discomfort. Bloating. Nausea. Follow these instructions at home: The instructions below may help you care for yourself at home. Your health care provider may give you more instructions. If you have questions, ask your health care provider. If you were  given a sedative during the procedure, it can affect you for several hours. Do not drive or operate machinery until your health care provider says that it is safe. If you will be going home right after the procedure, plan to have a responsible adult: Take you home from the hospital or clinic. You will not be allowed to drive. Care for you for the time you are told. Follow instructions from your health care provider about what you may eat and drink. Return to your normal activities as told by your health care provider. Ask your health care provider what activities are safe for you. Take over-the-counter and prescription medicines only as told by your health care provider. Contact a health care provider if you: Have a sore throat that lasts longer than one day. Have trouble swallowing. Have a fever. Get help right away if you: Vomit blood or your vomit looks like coffee grounds. Have bloody, black, or tarry stools. Have a very bad sore throat or you cannot swallow. Have difficulty breathing or very bad pain in your chest or abdomen. These symptoms may be an emergency. Get help right away. Call 911. Do not wait to see if the symptoms will go away. Do not drive yourself to the hospital. Summary After the procedure, it is common to have a sore throat, mild stomach discomfort, bloating,  and nausea. If you were given a sedative during the procedure, it can affect you for several hours. Do not drive until your health care provider says that it is safe. Follow instructions from your health care provider about what you may eat and drink. Return to your normal activities as told by your health care provider. This information is not intended to replace advice given to you by your health care provider. Make sure you discuss any questions you have with your health care provider. Document Revised: 05/04/2021 Document Reviewed: 05/04/2021 Elsevier Patient Education  2023 [employer] Esophageal  Dilatation Esophageal dilatation, also called esophageal dilation, is a procedure to widen or open a blocked or narrowed part of the esophagus. The esophagus is the part of the body that moves food and liquid from the mouth to the stomach. You may need this procedure if: You have a buildup of scar tissue in your esophagus that makes it difficult, painful, or impossible to swallow. This can be caused by gastroesophageal reflux disease (GERD). You have cancer of the esophagus. There is a problem with how food moves through your esophagus. In some cases, you may need this procedure repeated at a later time to dilate the esophagus gradually. Tell a health care provider about: Any allergies you have. All medicines you are taking, including vitamins, herbs, eye drops, creams, and over-the-counter medicines. Any problems you or family members have had with anesthetic medicines. Any blood disorders you have. Any surgeries you have had. Any medical conditions you have. Any antibiotic medicines you are required to take before dental procedures. Whether you are pregnant or may be pregnant. What are the risks? Generally, this is a safe procedure. However, problems may occur, including: Bleeding due to a tear in the lining of the esophagus. A hole, or perforation, in the esophagus. What happens before the procedure? Ask your health care provider about: Changing or stopping your regular medicines. This is especially important if you are taking diabetes medicines or blood thinners. Taking medicines such as aspirin and ibuprofen. These medicines can thin your blood. Do not take these medicines unless your health care provider tells you to take them. Taking over-the-counter medicines, vitamins, herbs, and supplements. Follow instructions from your health care provider about eating or drinking restrictions. Plan to have a responsible adult take you home from the hospital or clinic. Plan to have a responsible  adult care for you for the time you are told after you leave the hospital or clinic. This is important. What happens during the procedure? You may be given a medicine to help you relax (sedative). A numbing medicine may be sprayed into the back of your throat, or you may gargle the medicine. Your health care provider may perform the dilatation using various surgical instruments, such as: Simple dilators. This instrument is carefully placed in the esophagus to stretch it. Guided wire bougies. This involves using an endoscope to insert a wire into the esophagus. A dilator is passed over this wire to enlarge the esophagus. Then the wire is removed. Balloon dilators. An endoscope with a small balloon is inserted into the esophagus. The balloon is inflated to stretch the esophagus and open it up. The procedure may vary among health care providers and hospitals. What can I expect after the procedure? Your blood pressure, heart rate, breathing rate, and blood oxygen level will be monitored until you leave the hospital or clinic. Your throat may feel slightly sore and numb. This will get better over time. You  will not be allowed to eat or drink until your throat is no longer numb. When you are able to drink, urinate, and sit on the edge of the bed without nausea or dizziness, you may be able to return home. Follow these instructions at home: Take over-the-counter and prescription medicines only as told by your health care provider. If you were given a sedative during the procedure, it can affect you for several hours. Do not drive or operate machinery until your health care provider says that it is safe. Plan to have a responsible adult care for you for the time you are told. This is important. Follow instructions from your health care provider about any eating or drinking restrictions. Do not use any products that contain nicotine or tobacco, such as cigarettes, e-cigarettes, and chewing tobacco. If you  need help quitting, ask your health care provider. Keep all follow-up visits. This is important. Contact a health care provider if: You have a fever. You have pain that is not relieved by medicine. Get help right away if: You have chest pain. You have trouble breathing. You have trouble swallowing. You vomit blood. You have black, tarry, or bloody stools. These symptoms may represent a serious problem that is an emergency. Do not wait to see if the symptoms will go away. Get medical help right away. Call your local emergency services (911 in the [place] Do not drive yourself to the hospital. Summary Esophageal dilatation, also called esophageal dilation, is a procedure to widen or open a blocked or narrowed part of the esophagus. Plan to have a responsible adult take you home from the hospital or clinic. For this procedure, a numbing medicine may be sprayed into the back of your throat, or you may gargle the medicine. Do not drive or operate machinery until your health care provider says that it is safe. This information is not intended to replace advice given to you by your health care provider. Make sure you discuss any questions you have with your health care provider. Document Revised: 06/11/2019 Document Reviewed: 06/11/2019 Elsevier Patient Education  [place] After The following information offers guidance on how to care for yourself after your procedure. Your health care provider may also give you more specific instructions. If you have problems or questions, contact your health care provider. What can I expect after the procedure? After the procedure, it is common to have: Tiredness. Little or no memory about what happened during or after the procedure. Impaired judgment when it comes to making decisions. Nausea or vomiting. Some trouble with balance. Follow these instructions at home: For the time period you were told by your health  care provider:  Rest. Do not participate in activities where you could fall or become injured. Do not drive or use machinery. Do not drink alcohol. Do not take sleeping pills or medicines that cause drowsiness. Do not make important decisions or sign legal documents. Do not take care of children on your own. Medicines Take over-the-counter and prescription medicines only as told by your health care provider. If you were prescribed antibiotics, take them as told by your health care provider. Do not stop using the antibiotic even if you start to feel better. Eating and drinking Follow instructions from your health care provider about what you may eat and drink. Drink enough fluid to keep your urine pale yellow. If you vomit: Drink clear fluids slowly and in small amounts as you are able. Clear fluids include  water, ice chips, low-calorie sports drinks, and fruit juice that has water added to it (diluted fruit juice). Eat light and bland foods in small amounts as you are able. These foods include bananas, applesauce, rice, lean meats, toast, and crackers. General instructions  Have a responsible adult stay with you for the time you are told. It is important to have someone help care for you until you are awake and alert. If you have sleep apnea, surgery and some medicines can increase your risk for breathing problems. Follow instructions from your health care provider about wearing your sleep device: When you are sleeping. This includes during daytime naps. While taking prescription pain medicines, sleeping medicines, or medicines that make you drowsy. Do not use any products that contain nicotine or tobacco. These products include cigarettes, chewing tobacco, and vaping devices, such as e-cigarettes. If you need help quitting, ask your health care provider. Contact a health care provider if: You feel nauseous or vomit every time you eat or drink. You feel light-headed. You are still sleepy  or having trouble with balance after 24 hours. You get a rash. You have a fever. You have redness or swelling around the IV site. Get help right away if: You have trouble breathing. You have new confusion after you get home. These symptoms may be an emergency. Get help right away. Call 911. Do not wait to see if the symptoms will go away. Do not drive yourself to the hospital. This information is not intended to replace advice given to you by your health care provider. Make sure you discuss any questions you have with your health care provider. Document Revised: 06/20/2021 Document Reviewed: 06/20/2021 Elsevier Patient Education  2023 [employer].

## 2022-02-28 ENCOUNTER — Encounter (HOSPITAL_COMMUNITY): Payer: Self-pay | Admitting: Anesthesiology

## 2022-02-28 ENCOUNTER — Telehealth: Payer: Self-pay | Admitting: *Deleted

## 2022-02-28 ENCOUNTER — Ambulatory Visit: Payer: Medicaid Other | Admitting: Urology

## 2022-02-28 ENCOUNTER — Telehealth: Payer: Self-pay | Admitting: Cardiology

## 2022-02-28 NOTE — Pre-Procedure Instructions (Signed)
Spoke with  Dr Charna Elizabeth about patient's upcoming cardiac testing. Dr Charna Elizabeth wants to postpone procedure until after patient sees cardiology again on 03/08/2022. I messaged Dr Gala Romney and Mindy at the office and also let Blair Hailey, OR scheduler know.

## 2022-02-28 NOTE — Telephone Encounter (Signed)
noted 

## 2022-02-28 NOTE — Telephone Encounter (Signed)
Dr. Gala Romney, should we bring him back in for OV before rescheduling? thanks

## 2022-02-28 NOTE — Telephone Encounter (Signed)
Precert team sent a message saying that the pts nuc stress test was denied by his insurance

## 2022-02-28 NOTE — Telephone Encounter (Signed)
Good morning! I just spoke with Dr Charna Elizabeth about Anthony Vaughn and he wants procedure postponed until after he sees cardiology on 03/08/2022. I will let Hoyle Sauer know to pull him into the [place].

## 2022-03-01 ENCOUNTER — Encounter (HOSPITAL_COMMUNITY)
Admission: RE | Admit: 2022-03-01 | Discharge: 2022-03-01 | Disposition: A | Discharge status: Home / Self Care | Payer: Medicaid Other | Source: Ambulatory Visit | Attending: Internal Medicine | Admitting: Internal Medicine

## 2022-03-01 ENCOUNTER — Encounter (HOSPITAL_COMMUNITY): Payer: Self-pay

## 2022-03-01 DIAGNOSIS — Z79899 Other long term (current) drug therapy: Secondary | ICD-10-CM

## 2022-03-01 DIAGNOSIS — D649 Anemia, unspecified: Secondary | ICD-10-CM

## 2022-03-01 NOTE — Telephone Encounter (Signed)
LMOVM for pt making aware procedure cancelled and needs to see cardiology 1st.

## 2022-03-02 ENCOUNTER — Ambulatory Visit (HOSPITAL_COMMUNITY): Payer: Medicaid Other

## 2022-03-02 ENCOUNTER — Encounter (HOSPITAL_COMMUNITY): Payer: Medicaid Other

## 2022-03-03 ENCOUNTER — Encounter (HOSPITAL_COMMUNITY): Payer: Self-pay

## 2022-03-03 ENCOUNTER — Ambulatory Visit (HOSPITAL_COMMUNITY): Admit: 2022-03-03 | Payer: Medicaid Other | Admitting: Internal Medicine

## 2022-03-03 SURGERY — ESOPHAGOGASTRODUODENOSCOPY (EGD) WITH PROPOFOL
Anesthesia: Monitor Anesthesia Care

## 2022-03-06 ENCOUNTER — Ambulatory Visit (HOSPITAL_COMMUNITY)
Admission: RE | Admit: 2022-03-06 | Discharge: 2022-03-06 | Disposition: A | Discharge status: Home / Self Care | Payer: Medicaid Other | Source: Ambulatory Visit | Attending: Nurse Practitioner | Admitting: Nurse Practitioner

## 2022-03-06 ENCOUNTER — Other Ambulatory Visit (HOSPITAL_COMMUNITY)
Admission: RE | Admit: 2022-03-06 | Discharge: 2022-03-06 | Disposition: A | Discharge status: Home / Self Care | Payer: Medicaid Other | Source: Ambulatory Visit | Attending: Nurse Practitioner | Admitting: Nurse Practitioner

## 2022-03-06 DIAGNOSIS — Z79899 Other long term (current) drug therapy: Secondary | ICD-10-CM | POA: Insufficient documentation

## 2022-03-06 DIAGNOSIS — R0609 Other forms of dyspnea: Secondary | ICD-10-CM | POA: Insufficient documentation

## 2022-03-06 DIAGNOSIS — I5022 Chronic systolic (congestive) heart failure: Secondary | ICD-10-CM | POA: Insufficient documentation

## 2022-03-06 LAB — ECHOCARDIOGRAM COMPLETE
Area-P 1/2: 3.27 cm2
Calc EF: 43.7 %
MV M vel: 4.59 m/s
MV Peak grad: 84.3 mmHg
S' Lateral: 4.4 cm
Single Plane A2C EF: 34 %
Single Plane A4C EF: 50 %

## 2022-03-06 LAB — BASIC METABOLIC PANEL
Anion gap: 8 (ref 5–15)
BUN: 27 mg/dL — ABNORMAL HIGH (ref 6–20)
CO2: 23 mmol/L (ref 22–32)
Calcium: 9.8 mg/dL (ref 8.9–10.3)
Chloride: 107 mmol/L (ref 98–111)
Creatinine, Ser: 1.25 mg/dL — ABNORMAL HIGH (ref 0.61–1.24)
GFR, Estimated: >60 mL/min (ref >60)
Glucose, Bld: 87 mg/dL (ref 70–99)
Potassium: 4.4 mmol/L (ref 3.5–5.1)
Sodium: 138 mmol/L (ref 135–145)

## 2022-03-06 NOTE — Progress Notes (Signed)
*  PRELIMINARY RESULTS* Echocardiogram 2D Echocardiogram has been performed.  Samuel Germany 03/06/2022, 11:24 AM

## 2022-03-08 ENCOUNTER — Telehealth: Payer: Self-pay | Admitting: Cardiology

## 2022-03-08 ENCOUNTER — Other Ambulatory Visit: Payer: Medicaid Other

## 2022-03-08 ENCOUNTER — Encounter: Payer: Self-pay | Admitting: *Deleted

## 2022-03-08 ENCOUNTER — Ambulatory Visit: Payer: Medicaid Other | Attending: Cardiology | Admitting: Cardiology

## 2022-03-08 ENCOUNTER — Encounter: Payer: Self-pay | Admitting: Cardiology

## 2022-03-08 VITALS — BP 144/90 | HR 62 | Ht 72.0 in | Wt 163.4 lb

## 2022-03-08 DIAGNOSIS — I5022 Chronic systolic (congestive) heart failure: Secondary | ICD-10-CM

## 2022-03-08 DIAGNOSIS — I471 Supraventricular tachycardia, unspecified: Secondary | ICD-10-CM | POA: Diagnosis not present

## 2022-03-08 DIAGNOSIS — Z79899 Other long term (current) drug therapy: Secondary | ICD-10-CM

## 2022-03-08 DIAGNOSIS — I2510 Atherosclerotic heart disease of native coronary artery without angina pectoris: Secondary | ICD-10-CM

## 2022-03-08 DIAGNOSIS — R079 Chest pain, unspecified: Secondary | ICD-10-CM

## 2022-03-08 MED ORDER — EMPAGLIFLOZIN 10 MG PO TABS: 10.0000 mg | ORAL_TABLET | Freq: Every day | ORAL | 3 refills | Status: DC

## 2022-03-08 MED ORDER — CHLORTHALIDONE 25 MG PO TABS: 12.5000 mg | ORAL_TABLET | Freq: Every day | ORAL | 3 refills | Status: AC

## 2022-03-08 NOTE — Telephone Encounter (Signed)
Checking percert on the following patient for testing scheduled at [facility]   LEXISCAN   03/16/2022

## 2022-03-08 NOTE — Patient Instructions (Addendum)
Medication Instructions:  Your physician has recommended you make the following change in your medication:  Start Jardiance 10 mg daily-take voucher to pharmacy Start chlorthalidone 12.5 mg daily Continue other medications the same  Labwork: BMET & Magnesium Levels in 2 weeks (03/22/22) Non-fasting [facility]  Testing/Procedures: Your physician has requested that you have a lexiscan myoview. For further information please visit [email or web address]. Please follow instruction sheet, as given.  Follow-Up: Your physician recommends that you schedule a follow-up appointment in: 3 months  Any Other Special Instructions Will Be Listed Below (If Applicable).  If you need a refill on your cardiac medications before your next appointment, please call your pharmacy.

## 2022-03-08 NOTE — Progress Notes (Signed)
Clinical Summary Anthony Vaughn is a 52 y.o.male seen today for follow up of the following medical problems.   1. Chronic systolic HF - 07/2019 echo LVEF 40-45%, inferior wall akinetic, basal inferolateral and apical inferior are hypokinetic.   08/2021 echo LVEF 40-45%, grade II dd. The inferior wall is akinetic. The basal inferolateral segment and apical inferior segment are hypokinetic - 08/2019 nuclear stress: large iferior infarct, no ischemia  - presumed ICM based on WMA, nuclear findings   -just taking entresto once daily. -  11/29/21 labs showed significant AKI. Cr up to 2.25 - recent GI issues, some recent decrease intake.  - no recent edema. Some SOB/DOE at times, short to medium distances.      -AKI 11/29/21 labs Cr up to 2.25, on f/u 1.47 then 1.62 - due to Cr we lowered entresto to 24/26mg  bid on 01/02/22     Jan 2024 echo LVEF 40-45%, grade I dd  - compliant with meds - no recent edema. Some recent progression SOB/DOE        2. Palpitations/SVT - ER visit yesterday with palpitations, nausea, severe SOB - from notes EMS found him to be in SVT, given rounds of adenosine without improvement - ER notes mention EKG with SVT rate 200 - developed some hypotension, required cardioversion -after cardioversion discussions for transfer to [facility] main campus but patient signed out AMA  - Free T4 0.79 TSH 1.18 Mg 1.6 K 3.7 Cr 1.39 Hstrop 131-->279     - coreg caused SOB and fatigue, toprol much better tolerated.   - some palpitations, recent pneumonia.    3. HTN - admitted 07/2019 with severe HTN, started on cardene drip -compliant with meds - home bp's 145/90s   4. History of diverticulitis - recent hemicolectomy 07/2019   5. Presumed CAD - 08/2019 nuclear stress: large iferior infarct, no ischemia - 07/2019 echo inferior wall is akinetic.  - 08/2021 echo LVEF 40-45%, grade II dd. The inferior wall is akinetic. The basal inferolateral segment and apical inferior  segment are hypokinetic   -some recent chest pains. -used to come about once a month, now increased in frequency about 1-2 times per week - midhchest/epigastric area, burning/ache 8/10 in severity. Can occur at rest or with activity. Can get diaphoretic but not often. Longest episodes 5 minutes, shortest just a few seconds. No relation to food. Some recent DOE from clinic parking lot which has progressed.      6. Hyperlipidemia - changed to crestor 5mg  daily Jan 2023 - side effects on lipitor, stomach discomfort - taking crestor 10mg  daily, reports recently increased by pcp   01/2021 TC 187 TG 50 HDL 68 LDL 109   - 01/02/22 crestor increased to 20mg  daily -12/2021 TC 172 TG 21 HDL 77 LDL 91       7.Preoperative evaluation - considering right inguinal hernia repair    Past Medical History:  Diagnosis Date   Arthritis    CAD (coronary artery disease)    Chronic systolic heart failure (HCC)    Diverticulitis    s/p partial colectomy due to stenotic sigmoind colon   Dyspnea    Dysrhythmia    GERD (gastroesophageal reflux disease)    HTN (hypertension)    Myocardial infarction (HCC) 2021   Panic attacks    SVT (supraventricular tachycardia)      Allergies  Allergen Reactions   Other     States he's allergic to a blood pressure pill that causes abdominal  pain. He's not sure name of medication     Current Outpatient Medications  Medication Sig Dispense Refill   amLODipine (NORVASC) 10 MG tablet Take 1 tablet (10 mg total) by mouth daily. For Blood Pressure 30 tablet 5   amoxicillin-clavulanate (AUGMENTIN) 875-125 MG tablet 1 tablet Orally every 12 hrs for 10 days (Patient not taking: Reported on 02/21/2022)     aspirin EC 81 MG tablet Take 1 tablet (81 mg total) by mouth daily. Swallow whole. 90 tablet 3   azithromycin (ZITHROMAX) 250 MG tablet as directed: 2 tabs on day 1, then take 1 tab daily Orally daily for 5 days (Patient not taking: Reported on 02/21/2022)      docusate sodium (COLACE) 100 MG capsule Take 100 mg by mouth daily as needed for mild constipation.     ferrous sulfate 325 (65 FE) MG tablet 1 tablet with meal Orally daily for 30 day(s)     metoprolol succinate (TOPROL-XL) 100 MG 24 hr tablet Take 1 tablet (100 mg total) by mouth daily. Take with or immediately following a meal. 30 tablet 6   ondansetron (ZOFRAN) 4 MG tablet Take 4 mg by mouth every 8 (eight) hours as needed for nausea or vomiting.     pantoprazole (PROTONIX) 40 MG tablet Take 1 tablet (40 mg total) by mouth 2 (two) times daily before a meal. 60 tablet 3   polyethylene glycol (MIRALAX / GLYCOLAX) 17 g packet Take 17 g by mouth daily.     predniSONE (DELTASONE) 20 MG tablet Take 3 tablets by mouth on first day and 1 tablet twice daily for 5 days Orally Twice a day for 6 days (Patient not taking: Reported on 02/21/2022)     rosuvastatin (CRESTOR) 20 MG tablet Take 1 tablet (20 mg total) by mouth daily. 30 tablet 6   sacubitril-valsartan (ENTRESTO) 24-26 MG Take 1 tablet by mouth 2 (two) times daily. 60 tablet 6   spironolactone (ALDACTONE) 25 MG tablet Take 1 tablet (25 mg total) by mouth daily. 90 tablet 3   sucralfate (CARAFATE) 1 GM/10ML suspension Take 10 mLs (1 g total) by mouth 4 (four) times daily. 420 mL 5   No current facility-administered medications for this visit.     Past Surgical History:  Procedure Laterality Date   BIOPSY  03/04/2019   Procedure: BIOPSY;  Surgeon: Daneil Dolin, MD;  [facility];  Service: Endoscopy;;  gastric   BIOPSY  12/01/2021   Procedure: BIOPSY;  Surgeon: Daneil Dolin, MD;  [facility];  Service: Endoscopy;;   COLONOSCOPY     COLONOSCOPY WITH PROPOFOL N/A 03/04/2019   Surgeon: Daneil Dolin, MD;  Diverticulosis in the entire colon with a stenotic, stiff, chronically inflamed sigmoid segment.  Low threshold for surgical consultation.  Grade 1 internal hemorrhoids. Repeat in 10 years.   COLONOSCOPY WITH  PROPOFOL N/A 12/01/2021   Procedure: COLONOSCOPY WITH PROPOFOL;  Surgeon: Daneil Dolin, MD;  [facility]: [facility];  Service: Endoscopy;  Laterality: N/A;  12:45 pm   ESOPHAGOGASTRODUODENOSCOPY (EGD) WITH PROPOFOL N/A 03/04/2019   Surgeon: Daneil Dolin, MD;  gastric mucosal changes found to be chronic gastritis and focal intestinal metaplasia negative for H. 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   ESOPHAGOGASTRODUODENOSCOPY (EGD) WITH PROPOFOL N/A 12/01/2021   Procedure: ESOPHAGOGASTRODUODENOSCOPY (EGD) WITH PROPOFOL;  Surgeon: Daneil Dolin, MD;  [facility];  Service: Endoscopy;  Laterality: N/A;  LAPAROSCOPIC PARTIAL COLECTOMY N/A 07/14/2019   Procedure: LAPAROSCOPIC  LEFT HEMICOLECTOMY;  Surgeon: Virl Cagey, MD;  Location: [facility];  Service: General;  Laterality: N/A;     Allergies  Allergen Reactions   Other     States he's allergic to a blood pressure pill that causes abdominal pain. He's not sure name of medication      Family History  Problem Relation Age of Onset   Lung cancer Father    Colon cancer Neg Hx    Colon polyps Neg Hx      Social History Anthony Vaughn reports that he has been smoking cigarettes. He has a 6.25 pack-year smoking history. He has never used smokeless tobacco. Anthony Vaughn reports no history of alcohol use.   Review of Systems CONSTITUTIONAL: No weight loss, fever, chills, weakness or fatigue.  HEENT: Eyes: No visual loss, blurred vision, double vision or yellow sclerae.No hearing loss, sneezing, congestion, runny nose or sore throat.  SKIN: No rash or itching.  CARDIOVASCULAR: per hpi RESPIRATORY:per hpi GASTROINTESTINAL: No anorexia, nausea, vomiting or diarrhea. No abdominal pain or blood.  GENITOURINARY: No burning on urination, no polyuria NEUROLOGICAL: No headache, dizziness, syncope, paralysis, ataxia, numbness or tingling in the extremities. No  change in bowel or bladder control.  MUSCULOSKELETAL: No muscle, back pain, joint pain or stiffness.  LYMPHATICS: No enlarged nodes. No history of splenectomy.  PSYCHIATRIC: No history of depression or anxiety.  ENDOCRINOLOGIC: No reports of sweating, cold or heat intolerance. No polyuria or polydipsia.  Marland Kitchen   Physical Examination Today's Vitals   03/08/22 0842  BP: (!) 144/90  Pulse: 62  SpO2: 99%  Weight: 163 lb 6.4 oz (74.1 kg)  Height: 6' (1.829 m)   Body mass index is 22.16 kg/m.  Gen: resting comfortably, no acute distress HEENT: no scleral icterus, pupils equal round and reactive, no palptable cervical adenopathy,  CV: RRR, no mrg, no jvd Resp: Clear to auscultation bilaterally GI: abdomen is soft, non-tender, non-distended, normal bowel sounds, no hepatosplenomegaly MSK: extremities are warm, no edema.  Skin: warm, no rash Neuro:  no focal deficits Psych: appropriate affect   Diagnostic Studies  Jan 2024 echo IMPRESSIONS     1. Left ventricular ejection fraction, by estimation, is 40 to 45%. The  left ventricle has mildly decreased function. The left ventricle  demonstrates regional wall motion abnormalities (see scoring  diagram/findings for description). The left ventricular   internal cavity size was mildly to moderately dilated. There is mild left  ventricular hypertrophy. Left ventricular diastolic parameters are  consistent with Grade I diastolic dysfunction (impaired relaxation). The  average left ventricular global  longitudinal strain is -10.7 %. The global longitudinal strain is  abnormal.   2. Right ventricular systolic function is mildly reduced. The right  ventricular size is normal. Tricuspid regurgitation signal is inadequate  for assessing PA pressure.   3. Left atrial size was mildly dilated.   4. The mitral valve is grossly normal. No evidence of mitral valve  regurgitation. No evidence of mitral stenosis.   5. The aortic valve is  tricuspid. Aortic valve regurgitation is not  visualized. No aortic stenosis is present.   6. Aortic dilatation noted. There is mild dilatation of the aortic root,  measuring 40 mm.   7. The inferior vena cava is dilated in size with >50% respiratory  variability, suggesting right atrial pressure of 8 mmHg.    Assessment and Plan  SVT - some symptoms at times, recent pneumonia -  limited on titration of beta locker given low normal resting HR, continue current meds at this time.    2. Chronic systolic HF - mild LV systolic dysfunction by echo - EKG, echo, nuclear stress  would suggest potential prior inferior infarct - significant AKI on higher entresto, resolved on lower dosing - add jardiance 10mg  daily.      3. Probable CAD - based on imaging as described above suggesting old inferior infarct - recent chest pains, progressive DOE. He is also considering possible hernia surgery - needs lexiscan for chest pain, DOE, preoprative risk stratification. Will order test.    4. Hyperlipidemia - continue statin, had increased dose few months ago. Repeat labs in very near future     5.Preoperative evaluatin - with recent chest pains and DOE would plan for lexiscan prior to clearning for hernia surgery     Arnoldo Lenis, M.D.

## 2022-03-08 NOTE — Addendum Note (Signed)
Addended by: Merlene Laughter on: 03/08/2022 09:51 AM   Modules accepted: Orders

## 2022-03-08 NOTE — Telephone Encounter (Signed)
Discussed during visit with [facility]

## 2022-03-08 NOTE — Progress Notes (Signed)
Anthony Vaughn (Key: [id number]) PA Case ID #: [id number] Rx #: [id number] Need Help? Call us at [phone] Outcome Approved today 03/08/2022 Request Reference Number: [id number]. JARDIANCE TAB 10MG  is approved through 03/09/2023. For further questions, call [employer] at [phone]. Authorization Expiration Date: 03/09/2023 Drug Jardiance 10MG  tablets ePA cloud logo Form [employer] Electronic Prior Authorization Form [phone] NCPDP) Original Claim Info 7

## 2022-03-12 ENCOUNTER — Other Ambulatory Visit: Payer: Self-pay | Admitting: Gastroenterology

## 2022-03-12 DIAGNOSIS — R1010 Upper abdominal pain, unspecified: Secondary | ICD-10-CM

## 2022-03-13 ENCOUNTER — Ambulatory Visit: Payer: Medicaid Other | Admitting: Gastroenterology

## 2022-03-16 ENCOUNTER — Encounter (HOSPITAL_COMMUNITY): Payer: Medicaid Other

## 2022-03-16 NOTE — Telephone Encounter (Signed)
[facility] is calling to let the office know that the test, LEXISCAN, was approved on their end and have the dates of approval. Requesting to know what the office may need to show proof of this so the patient may schedule.  CPT Code [phone] - Approved on 01/20 after first denial.  Ref # [id number]  Requesting patient be called to schedule.

## 2022-03-19 ENCOUNTER — Encounter: Payer: Self-pay | Admitting: Cardiology

## 2022-03-27 ENCOUNTER — Ambulatory Visit: Payer: Medicaid Other | Admitting: Cardiology

## 2022-04-10 NOTE — Progress Notes (Signed)
H&P  Chief Complaint: Elevated PSA  History of Present Illness: 52 yo male presents for E/M of elevated PSA. Only data I have is from December 2023--9.9 w/ 9% free fraction.  He denies any family history of prostate problems or prostate cancer.  He does have significant lower urinary tract symptoms-IPSS 27, quality-of-life score 4.  He is not on any medical therapy for BPH.  Biggest issue is nocturia x 5 as well as urinary frequency and urgency.  No leakage.  No history of urinary tract infections.    Past Medical History:  Diagnosis Date   Arthritis    CAD (coronary artery disease)    Chronic systolic heart failure (HCC)    Diverticulitis    s/p partial colectomy due to stenotic sigmoind colon   Dyspnea    Dysrhythmia    GERD (gastroesophageal reflux disease)    HTN (hypertension)    Myocardial infarction ([place]) 2021   Panic attacks    SVT (supraventricular tachycardia)     Past Surgical History:  Procedure Laterality Date   BIOPSY  03/04/2019   Procedure: BIOPSY;  Surgeon: Daneil Dolin, MD;  [facility];  Service: Endoscopy;;  gastric   BIOPSY  12/01/2021   Procedure: BIOPSY;  Surgeon: Daneil Dolin, MD;  [facility];  Service: Endoscopy;;   COLONOSCOPY     COLONOSCOPY WITH PROPOFOL N/A 03/04/2019   Surgeon: Daneil Dolin, MD;  Diverticulosis in the entire colon with a stenotic, stiff, chronically inflamed sigmoid segment.  Low threshold for surgical consultation.  Grade 1 internal hemorrhoids. Repeat in 10 years.   COLONOSCOPY WITH PROPOFOL N/A 12/01/2021   Procedure: COLONOSCOPY WITH PROPOFOL;  Surgeon: Daneil Dolin, MD;  [facility];  Service: Endoscopy;  Laterality: N/A;  12:45 pm   ESOPHAGOGASTRODUODENOSCOPY (EGD) WITH PROPOFOL N/A 03/04/2019   Surgeon: Daneil Dolin, MD;  gastric mucosal changes found to be chronic gastritis and focal intestinal metaplasia negative for H. 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   ESOPHAGOGASTRODUODENOSCOPY (EGD) WITH PROPOFOL N/A 12/01/2021   Procedure: ESOPHAGOGASTRODUODENOSCOPY (EGD) WITH PROPOFOL;  Surgeon: Daneil Dolin, MD;  [facility];  Service: Endoscopy;  Laterality: N/A;   LAPAROSCOPIC PARTIAL COLECTOMY N/A 07/14/2019   Procedure: LAPAROSCOPIC  LEFT HEMICOLECTOMY;  Surgeon: Virl Cagey, MD;  Location: [facility];  Service: General;  Laterality: N/A;    Home Medications:  Allergies as of 04/11/2022       Reactions   Other    States he's allergic to a blood pressure pill that causes abdominal pain. He's not sure name of medication        Medication List        Accurate as of April 10, 2022  7:06 PM. If you have any questions, ask your nurse or doctor.          amLODipine 10 MG tablet Commonly known as: NORVASC Take 1 tablet (10 mg total) by mouth daily. For Blood Pressure   aspirin EC 81 MG tablet Take 1 tablet (81 mg total) by mouth daily. Swallow whole.   chlorthalidone 25 MG tablet Commonly known as: HYGROTON Take 0.5 tablets (12.5 mg total) by mouth daily.   docusate sodium 100 MG capsule Commonly known as: COLACE Take 100 mg by mouth daily as needed for mild constipation.   empagliflozin 10 MG Tabs tablet Commonly known as: Jardiance Take 1 tablet (10 mg total) by mouth  daily before breakfast. Has voucher   Entresto 24-26 MG Generic drug: sacubitril-valsartan Take 1 tablet by mouth 2 (two) times daily.   ferrous sulfate 325 (65 FE) MG tablet 1 tablet with meal Orally daily for 30 day(s)   metoprolol succinate 100 MG 24 hr tablet Commonly known as: TOPROL-XL Take 1 tablet (100 mg total) by mouth daily. Take with or immediately following a meal.   ondansetron 4 MG tablet Commonly known as: ZOFRAN Take 4 mg by mouth every 8 (eight) hours as needed for nausea or vomiting.   pantoprazole 40 MG tablet Commonly known as: Protonix Take 1 tablet (40 mg  total) by mouth 2 (two) times daily before a meal.   polyethylene glycol 17 g packet Commonly known as: MIRALAX / GLYCOLAX Take 17 g by mouth daily.   rosuvastatin 20 MG tablet Commonly known as: CRESTOR Take 1 tablet (20 mg total) by mouth daily.   spironolactone 25 MG tablet Commonly known as: ALDACTONE Take 1 tablet (25 mg total) by mouth daily.   sucralfate 1 GM/10ML suspension Commonly known as: Carafate Take 10 mLs (1 g total) by mouth 4 (four) times daily.        Allergies:  Allergies  Allergen Reactions   Other     States he's allergic to a blood pressure pill that causes abdominal pain. He's not sure name of medication    Family History  Problem Relation Age of Onset   Lung cancer Father    Colon cancer Neg Hx    Colon polyps Neg Hx     Social History:  reports that he has been smoking cigarettes. He has a 6.25 pack-year smoking history. He has never used smokeless tobacco. He reports that he does not drink alcohol and does not use drugs.  ROS: A complete review of systems was performed.  All systems are negative except for pertinent findings as noted.  Physical Exam:  Vital signs in last 24 hours: There were no vitals taken for this visit. Constitutional:  Alert and oriented, No acute distress Cardiovascular: Regular rate  Respiratory: Normal respiratory effort GI: Well-healed lower abdominal scar in the midline.   Genitourinary: Normal male phallus, testes are descended bilaterally and non-tender and without masses, scrotum is normal in appearance without lesions or masses, perineum is normal on inspection.  Prostate 20 g, symmetric, nonnodular, nontender.  No rectal masses. Lymphatic: No lymphadenopathy Neurologic: Grossly intact, no focal deficits Psychiatric: Normal mood and affect  I have reviewed prior pt notes  I have reviewed notes from referring/previous physicians  I have reviewed urinalysis results-clear  I have independently reviewed  prior imaging--prostate small on CT scan from 2022.  I have reviewed prior PSA results     Impression/Assessment:  1.  Elevated PSA-recently 9.9.  He has a benign feeling and small prostate  2.  Significant lower urinary tract symptoms  Plan:  1.  I will have his PSA repeated today and call with results  2.  If still elevated he will need ultrasound and biopsy  3.  Tips for decreasing urinary frequency discussed

## 2022-04-11 ENCOUNTER — Ambulatory Visit (INDEPENDENT_AMBULATORY_CARE_PROVIDER_SITE_OTHER): Payer: Medicaid Other | Admitting: Urology

## 2022-04-11 VITALS — BP 138/88 | HR 69

## 2022-04-11 DIAGNOSIS — R972 Elevated prostate specific antigen [PSA]: Secondary | ICD-10-CM | POA: Diagnosis not present

## 2022-04-11 DIAGNOSIS — R351 Nocturia: Secondary | ICD-10-CM | POA: Diagnosis not present

## 2022-04-11 DIAGNOSIS — R350 Frequency of micturition: Secondary | ICD-10-CM | POA: Diagnosis not present

## 2022-04-12 LAB — URINALYSIS, ROUTINE W REFLEX MICROSCOPIC
Bilirubin, UA: NEGATIVE
Glucose, UA: NEGATIVE
Ketones, UA: NEGATIVE
Leukocytes,UA: NEGATIVE
Nitrite, UA: NEGATIVE
RBC, UA: NEGATIVE
Specific Gravity, UA: 1.025 (ref 1.005–1.030)
Urobilinogen, Ur: 0.2 mg/dL (ref 0.2–1.0)
pH, UA: 5.5 (ref 5.0–7.5)

## 2022-04-12 LAB — MICROSCOPIC EXAMINATION: Bacteria, UA: NONE SEEN

## 2022-04-12 LAB — PSA: Prostate Specific Ag, Serum: 8.7 ng/mL — ABNORMAL HIGH (ref 0.0–4.0)

## 2022-04-13 ENCOUNTER — Encounter: Payer: Medicaid Other | Admitting: Gastroenterology

## 2022-04-14 ENCOUNTER — Encounter: Payer: Self-pay | Admitting: Urology

## 2022-04-14 ENCOUNTER — Other Ambulatory Visit: Payer: Self-pay | Admitting: Urology

## 2022-04-14 DIAGNOSIS — R972 Elevated prostate specific antigen [PSA]: Secondary | ICD-10-CM

## 2022-04-14 MED ORDER — LEVOFLOXACIN 750 MG PO TABS: 750.0000 mg | ORAL_TABLET | Freq: Every day | ORAL | 0 refills | Status: AC

## 2022-04-17 ENCOUNTER — Telehealth: Payer: Self-pay

## 2022-04-17 NOTE — Telephone Encounter (Signed)
-----   Message from Franchot Gallo, MD sent at 04/14/2022 12:47 PM EST ----- Please call pt--PSA still elevated--give value. I wold rec TRUS/Bx--I put orders in. ----- Message ----- From: Sherrilyn Rist, CMA Sent: 04/12/2022   2:39 PM EST To: Franchot Gallo, MD  Please review

## 2022-04-19 ENCOUNTER — Telehealth: Payer: Self-pay | Admitting: Cardiology

## 2022-04-19 NOTE — Telephone Encounter (Signed)
Anthony Vaughn, called stating they are waiting on clearance for this patient, he is scheduled for his surgery next week on 3/21.  It looks like Dr. Harl Bowie has ordered a stress test on patient, but patient never had it done. Insurance had originally denied the stress test. They want to know if Dr. Harl Bowie will still give clearance even without the stress test.

## 2022-04-19 NOTE — Telephone Encounter (Signed)
I will update the requesting office the pt is going to need a stress test per Dr. Harl Bowie. Once the pt has been cleared we will fax notes over to requesting office.

## 2022-04-19 NOTE — Telephone Encounter (Signed)
Preoperative team please advise that per Dr. Nelly Laurence note patient will need Lexiscan Myoview prior to clearance being considered due to chest pain and shortness of breath.  Thank you, Ambrose Pancoast, NP

## 2022-04-19 NOTE — Telephone Encounter (Signed)
I will have pre op APP review if the pt has been cleared.  

## 2022-04-20 ENCOUNTER — Telehealth: Payer: Self-pay | Admitting: Cardiology

## 2022-04-20 NOTE — Telephone Encounter (Signed)
I did not need this encounter. °

## 2022-04-20 NOTE — Telephone Encounter (Signed)
Patient is aware biopsy have been scheduled and letter sent out. Patient voiced understanding

## 2022-04-21 ENCOUNTER — Other Ambulatory Visit: Payer: Self-pay | Admitting: Urology

## 2022-04-21 DIAGNOSIS — R972 Elevated prostate specific antigen [PSA]: Secondary | ICD-10-CM

## 2022-04-24 ENCOUNTER — Telehealth: Payer: Self-pay | Admitting: Cardiology

## 2022-04-24 NOTE — Telephone Encounter (Signed)
Low risk procedure, ok to proceed from cardiac standpoint  Zandra Abts MD

## 2022-04-24 NOTE — Telephone Encounter (Signed)
   Name: Anthony Vaughn  DOB: 1970/09/15  MRN: [id number]   Primary Cardiologist: Carlyle Dolly, MD  Chart reviewed as part of pre-operative protocol coverage. Barett Sobrino was last seen on 03/08/2022 by Dr. Harl Bowie. Patient was complaining of chest pain and DOE and stress test was ordered. Unfortunately, patient's insurance is denying coverage for the test. Case reviewed by Dr. Harl Bowie: "Low risk procedure, ok to proceed from cardiac standpoint."  Therefore, based on ACC/AHA guidelines, the patient would be at acceptable risk for the planned procedure without further cardiovascular testing.   Ideally aspirin should be continued without interruption, however if the bleeding risk is too great, aspirin may be held for 5-7 days prior to surgery. Please resume aspirin post operatively when it is felt to be safe from a bleeding standpoint.   I will route this recommendation to the requesting party via Epic fax function and remove from pre-op pool. Please call with questions.  Mayra Reel, NP 04/24/2022, 5:07 PM

## 2022-04-24 NOTE — Telephone Encounter (Signed)
   Pre-operative Risk Assessment    Patient Name: Anthony Vaughn  DOB: May 08, 1970 MRN: [id number]      Request for Surgical Clearance    Procedure:   prostate biopsy  Date of Surgery:  Clearance 05/23/22                                 Surgeon:  Dr. Diona Fanti Surgeon's Group or Practice Name:  [facility] [place] Phone number:  [phone] Fax number:  [phone]   Type of Clearance Requested:   - Medical  - Pharmacy:  Hold Aspirin Asprin EC 81   Type of Anesthesia:  Not Indicated   Additional requests/questions:    Louretta Shorten   04/24/2022, 8:00 AM

## 2022-04-24 NOTE — Telephone Encounter (Signed)
Dr. Harl Bowie,  Patient is pending prostate biopsy. You saw him on 03/08/2022. At that time he was complaining of mid chest/epigastric pain increasing in frequency not related to food, occurring with rest or activity, and occasionally associated with diaphoresis. He also complained of progressing DOE. You ordered a stress test for further risk stratification prior to surgery, however, patient's insurance denied the stress test. Looks like he is covered by Medicaid. How should we proceed?   Please route your response to P CV DIV Preop. I will communicate with requesting office once you have given recommendations.   Thank you!  Mayra Reel, NP

## 2022-04-24 NOTE — Telephone Encounter (Signed)
Preoperative team.  Per Dr. Nelly Laurence note on 03/08/2022, patient will need to undergo nuclear stress test prior to preoperative risk assessment. It appears it is ordered but not scheduled. Will you please contact the patient about getting this scheduled and inform the requesting party?   Thank you!  DW   Justice Britain. Nikko Quast, DNP, NP-C  04/24/2022, 8:36 AM [place] [address] Office [phone] Fax [phone]

## 2022-05-03 NOTE — Progress Notes (Addendum)
Referring Provider: Leonie Douglas, MD Primary Care Physician:  Leonie Douglas, MD Primary GI Physician: Dr. Gala Romney  No chief complaint on file.   HPI:   Anthony Vaughn is a 52 y.o. 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 pylori. Also with history of rectal bleeding related to hemorrhoids noted on colonoscopy in October 2023. He is presenting today for follow-up ***  Last seen in our office 02/14/2022.  Reported some early satiety, intermittent paper medic easier.  Had gained 3 pounds since his last office visit.  Using Zofran, Carafate on a regular basis for symptoms consistent with early satiety and nausea.  Continued on twice daily PPI.  Denied NSAIDs.  Plan to check CBC, H. pylori serologies, repeat EGD, and could consider hemorrhoid banding.  Patient was scheduled for EGD on 03/03/2022, but this was canceled as anesthesiologist requested patient have follow-up with cardiology for scheduling.  Echocardiogram completed 03/06/2022 with EF 40-45%. Myoview was ordered but never completed as insurance denied.    Today:        EGD 12/07/2021 erosive reflux esophagitis, small hiatal hernia, gastric erosions, prepyloric gastric ulcer biopsied, patent pylorus.  3 distinct areas of stricture involving the posterior bulb and second portion of the duodenum with associated ulceration requiring sequential balloon dilation.  Pediatric colonoscope was able to pass through all of these areas into the distal second portion.  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  Recommended PPI twice daily, avoid NSAIDs, anticipate repeat EGD in about 3 months using pediatric colonoscope.  Pathology revealed chronic active gastritis, negative for H. pylori.  Colonoscopy 12/01/2021: Internal hemorrhoids, evidence of prior surgical resection with anastomosis at 25 cm, otherwise normal  exam.  Suspected hemorrhoidal bleeding.  Recommended repeat in 10 years.  Past Medical History:  Diagnosis Date   Arthritis    CAD (coronary artery disease)    Chronic systolic heart failure (HCC)    Diverticulitis    s/p partial colectomy due to stenotic sigmoind colon   Dyspnea    Dysrhythmia    GERD (gastroesophageal reflux disease)    HTN (hypertension)    Myocardial infarction ([place]) 2021   Panic attacks    SVT (supraventricular tachycardia)     Past Surgical History:  Procedure Laterality Date   BIOPSY  03/04/2019   Procedure: BIOPSY;  Surgeon: Daneil Dolin, MD;  [facility];  Service: Endoscopy;;  gastric   BIOPSY  12/01/2021   Procedure: BIOPSY;  Surgeon: Daneil Dolin, MD;  [facility];  Service: Endoscopy;;   COLONOSCOPY     COLONOSCOPY WITH PROPOFOL N/A 03/04/2019   Surgeon: Daneil Dolin, MD;  Diverticulosis in the entire colon with a stenotic, stiff, chronically inflamed sigmoid segment.  Low threshold for surgical consultation.  Grade 1 internal hemorrhoids. Repeat in 10 years.   COLONOSCOPY WITH PROPOFOL N/A 12/01/2021   Procedure: COLONOSCOPY WITH PROPOFOL;  Surgeon: Daneil Dolin, MD;  [facility];  Service: Endoscopy;  Laterality: N/A;  12:45 pm   ESOPHAGOGASTRODUODENOSCOPY (EGD) WITH PROPOFOL N/A 03/04/2019   Surgeon: Daneil Dolin, MD;  gastric mucosal changes found to be chronic gastritis and focal intestinal metaplasia negative for H. 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   ESOPHAGOGASTRODUODENOSCOPY (EGD) WITH PROPOFOL N/A 12/01/2021   Procedure: ESOPHAGOGASTRODUODENOSCOPY (EGD) WITH PROPOFOL;  Surgeon: Manus Rudd  M, MD;  [facility];  Service: Endoscopy;  Laterality: N/A;   LAPAROSCOPIC PARTIAL COLECTOMY N/A 07/14/2019   Procedure: LAPAROSCOPIC  LEFT HEMICOLECTOMY;  Surgeon: Virl Cagey, MD;  Location: [facility];  Service: General;   Laterality: N/A;    Current Outpatient Medications  Medication Sig Dispense Refill   amLODipine (NORVASC) 10 MG tablet Take 1 tablet (10 mg total) by mouth daily. For Blood Pressure 30 tablet 5   aspirin EC 81 MG tablet Take 1 tablet (81 mg total) by mouth daily. Swallow whole. 90 tablet 3   chlorthalidone (HYGROTON) 25 MG tablet Take 0.5 tablets (12.5 mg total) by mouth daily. 15 tablet 3   docusate sodium (COLACE) 100 MG capsule Take 100 mg by mouth daily as needed for mild constipation.     empagliflozin (JARDIANCE) 10 MG TABS tablet Take 1 tablet (10 mg total) by mouth daily before breakfast. Has voucher 30 tablet 3   ferrous sulfate 325 (65 FE) MG tablet 1 tablet with meal Orally daily for 30 day(s)     metoprolol succinate (TOPROL-XL) 100 MG 24 hr tablet Take 1 tablet (100 mg total) by mouth daily. Take with or immediately following a meal. 30 tablet 6   ondansetron (ZOFRAN) 4 MG tablet Take 4 mg by mouth every 8 (eight) hours as needed for nausea or vomiting.     pantoprazole (PROTONIX) 40 MG tablet Take 1 tablet (40 mg total) by mouth 2 (two) times daily before a meal. 60 tablet 3   polyethylene glycol (MIRALAX / GLYCOLAX) 17 g packet Take 17 g by mouth daily.     rosuvastatin (CRESTOR) 20 MG tablet Take 1 tablet (20 mg total) by mouth daily. 30 tablet 6   sacubitril-valsartan (ENTRESTO) 24-26 MG Take 1 tablet by mouth 2 (two) times daily. 60 tablet 6   spironolactone (ALDACTONE) 25 MG tablet Take 1 tablet (25 mg total) by mouth daily. 90 tablet 3   sucralfate (CARAFATE) 1 GM/10ML suspension Take 10 mLs (1 g total) by mouth 4 (four) times daily. 420 mL 5   No current facility-administered medications for this visit.    Allergies as of 05/04/2022 - Review Complete 04/11/2022  Allergen Reaction Noted   Other  02/04/2019    Family History  Problem Relation Age of Onset   Lung cancer Father    Colon cancer Neg Hx    Colon polyps Neg Hx     Social History   Socioeconomic  History   Marital status: Single    Spouse name: Not on file   Number of children: Not on file   Years of education: Not on file   Highest education level: Not on file  Occupational History   Not on file  Tobacco Use   Smoking status: Every Day    Packs/day: 0.25    Years: 25.00    Additional pack years: 0.00    Total pack years: 6.25    Types: Cigarettes   Smokeless tobacco: Never  Vaping Use   Vaping Use: Never used  Substance and Sexual Activity   Alcohol use: No   Drug use: No   Sexual activity: Yes  Other Topics Concern   Not on file  Social History Narrative   Not on file   Social Determinants of Health   Financial Resource Strain: Not on file  Food Insecurity: Not on file  Transportation Needs: Not on file  Physical Activity: Not on file  Stress: Not on file  Social  Connections: Not on file    Review of Systems: Gen: Denies fever, chills, anorexia. Denies fatigue, weakness, weight loss.  CV: Denies chest pain, palpitations, syncope, peripheral edema, and claudication. Resp: Denies dyspnea at rest, cough, wheezing, coughing up blood, and pleurisy. GI: Denies vomiting blood, jaundice, and fecal incontinence.   Denies dysphagia or odynophagia. Derm: Denies rash, itching, dry skin Psych: Denies depression, anxiety, memory loss, confusion. No homicidal or suicidal ideation.  Heme: Denies bruising, bleeding, and enlarged lymph nodes.  Physical Exam: There were no vitals taken for this visit. General:   Alert and oriented. No distress noted. Pleasant and cooperative.  Head:  Normocephalic and atraumatic. Eyes:  Conjuctiva clear without scleral icterus. Heart:  S1, S2 present without murmurs appreciated. Lungs:  Clear to auscultation bilaterally. No wheezes, rales, or rhonchi. No distress.  Abdomen:  +BS, soft, non-tender and non-distended. No rebound or guarding. No HSM or masses noted. Msk:  Symmetrical without gross deformities. Normal posture. Extremities:   Without edema. Neurologic:  Alert and  oriented x4 Psych:  Normal mood and affect.    Assessment:     Plan:  ***   Aliene Altes, PA-C [facility] 05/04/2022

## 2022-05-04 ENCOUNTER — Ambulatory Visit (INDEPENDENT_AMBULATORY_CARE_PROVIDER_SITE_OTHER): Payer: Medicaid Other | Admitting: Gastroenterology

## 2022-05-04 ENCOUNTER — Encounter: Payer: Self-pay | Admitting: Gastroenterology

## 2022-05-04 VITALS — BP 125/72 | HR 62 | Temp 97.6°F | Ht 72.0 in | Wt 165.6 lb

## 2022-05-04 DIAGNOSIS — K219 Gastro-esophageal reflux disease without esophagitis: Secondary | ICD-10-CM | POA: Diagnosis not present

## 2022-05-04 DIAGNOSIS — K649 Unspecified hemorrhoids: Secondary | ICD-10-CM | POA: Insufficient documentation

## 2022-05-04 DIAGNOSIS — K5900 Constipation, unspecified: Secondary | ICD-10-CM

## 2022-05-04 DIAGNOSIS — K279 Peptic ulcer, site unspecified, unspecified as acute or chronic, without hemorrhage or perforation: Secondary | ICD-10-CM

## 2022-05-04 DIAGNOSIS — R1310 Dysphagia, unspecified: Secondary | ICD-10-CM | POA: Diagnosis not present

## 2022-05-04 DIAGNOSIS — R1010 Upper abdominal pain, unspecified: Secondary | ICD-10-CM | POA: Diagnosis not present

## 2022-05-04 DIAGNOSIS — R110 Nausea: Secondary | ICD-10-CM

## 2022-05-04 MED ORDER — ONDANSETRON HCL 4 MG PO TABS: 4.0000 mg | ORAL_TABLET | Freq: Three times a day (TID) | ORAL | 0 refills | Status: DC | PRN

## 2022-05-04 MED ORDER — SUCRALFATE 1 GM/10ML PO SUSP: 1.0000 g | Freq: Three times a day (TID) | ORAL | 5 refills | Status: DC

## 2022-05-04 NOTE — Patient Instructions (Addendum)
Please call your cardiologist to discuss rescheduling your stress test to evaluate your heart ASAP.  This is needed before we can proceed with an upper endoscopy.  Continue to avoid all NSAID products including ibuprofen, Aleve, Advil, BC powders, Goody powders, anything that says "NSAID" on the package.  Continue pantoprazole 40 mg twice daily 30 minutes before breakfast and dinner.  Continue Carafate up to 3 times daily before meals and at bedtime.  Continue Zofran as needed for nausea or vomiting.  For constipation: Start Linzess 145 mcg daily.  We are providing you with samples today.  Please call with a progress report next week and let me know how the medication is working for you.  If it works well, I will send in a prescription.  If it does not work well, we can try something different.  For your hemorrhoids: We need to get your constipation under better control. Avoid straining. Do not sit on the toilet for more than 2 or 3 minutes. Use Preparation H as needed.  We will arrange you to have an appointment with Vicente Males in early May for hemorrhoid banding.   It was good to see you today!  Aliene Altes, PA-C [facility]

## 2022-05-22 ENCOUNTER — Other Ambulatory Visit: Payer: Medicaid Other | Admitting: Urology

## 2022-05-23 ENCOUNTER — Other Ambulatory Visit: Payer: Medicaid Other | Admitting: Urology

## 2022-05-23 ENCOUNTER — Ambulatory Visit (HOSPITAL_COMMUNITY): Payer: Medicaid Other | Attending: Urology

## 2022-05-23 ENCOUNTER — Other Ambulatory Visit: Payer: Self-pay | Admitting: Gastroenterology

## 2022-05-23 DIAGNOSIS — R110 Nausea: Secondary | ICD-10-CM

## 2022-05-23 DIAGNOSIS — R972 Elevated prostate specific antigen [PSA]: Secondary | ICD-10-CM

## 2022-05-26 ENCOUNTER — Telehealth: Payer: Self-pay | Admitting: *Deleted

## 2022-05-26 ENCOUNTER — Other Ambulatory Visit: Payer: Self-pay | Admitting: Gastroenterology

## 2022-05-26 DIAGNOSIS — R1013 Epigastric pain: Secondary | ICD-10-CM

## 2022-05-26 MED ORDER — SUCRALFATE 1 G PO TABS: 1.0000 g | ORAL_TABLET | Freq: Three times a day (TID) | ORAL | 3 refills | Status: DC

## 2022-05-26 NOTE — Telephone Encounter (Signed)
Pt called and states he needs carafate tablets instead of the liquid. He feels like the tablets work better than liquid. Please send to pharmacy.

## 2022-05-26 NOTE — Telephone Encounter (Signed)
Rx sent 

## 2022-06-08 ENCOUNTER — Encounter: Payer: Self-pay | Admitting: Gastroenterology

## 2022-06-08 ENCOUNTER — Ambulatory Visit (INDEPENDENT_AMBULATORY_CARE_PROVIDER_SITE_OTHER): Payer: Medicaid Other | Admitting: Gastroenterology

## 2022-06-08 VITALS — BP 114/69 | HR 64 | Temp 97.3°F | Ht 72.0 in | Wt 161.2 lb

## 2022-06-08 DIAGNOSIS — K641 Second degree hemorrhoids: Secondary | ICD-10-CM | POA: Insufficient documentation

## 2022-06-08 NOTE — Progress Notes (Signed)
       [facility] BANDING PROCEDURE NOTE  Anthony Vaughn is a 52 y.o. male presenting today for consideration of hemorrhoid banding. Last colonoscopy 12/01/2021: Internal hemorrhoids, evidence of prior surgical resection with anastomosis at 25 cm, otherwise normal exam.  He has had good results with taking Linzess 145 mcg prn.    The patient presents with symptomatic grade 2 hemorrhoids, unresponsive to maximal medical therapy, requesting rubber band ligation of his hemorrhoidal disease. All risks, benefits, and alternative forms of therapy were described and informed consent was obtained.   The decision was made to band the left lateral internal hemorrhoid, and the [facility] O'Regan System was used to perform band ligation without complication. However, insufficient tissue was captured. I then banded neutrally, and this produced excellent results in left lateral position. Digital anorectal examination was then performed to assure proper positioning of the band, and to adjust the banded tissue as required. The patient was discharged home without pain or other issues. Dietary and behavioral recommendations were given, along with follow-up instructions. The patient will return in several weeks for followup and possible additional banding as required.  No complications were encountered and the patient tolerated the procedure well.   Gelene Mink, PhD, ANP-BC [facility]

## 2022-06-08 NOTE — Patient Instructions (Signed)
  Please avoid straining.  You should limit your toilet time to 2-3 minutes at the most.   I recommend Benefiber 2 teaspoons each morning in the beverage of your choice!  Please call me with any concerns or issues!  I will see you in follow-up for additional banding in several weeks.     It was a pleasure to see you today. I want to create trusting relationships with patients and provide genuine, compassionate, and quality care. I truly value your feedback, so please be on the lookout for a survey regarding your visit with me today. I appreciate your time in completing this!    Shadow Schedler W. Kaspian Muccio, PhD, ANP-BC [facility]     

## 2022-06-16 ENCOUNTER — Other Ambulatory Visit: Payer: Self-pay | Admitting: Gastroenterology

## 2022-06-16 DIAGNOSIS — R110 Nausea: Secondary | ICD-10-CM

## 2022-06-20 ENCOUNTER — Encounter: Payer: Self-pay | Admitting: *Deleted

## 2022-06-20 ENCOUNTER — Ambulatory Visit: Payer: Medicaid Other | Attending: Cardiology | Admitting: Cardiology

## 2022-06-20 ENCOUNTER — Encounter: Payer: Self-pay | Admitting: Cardiology

## 2022-06-20 VITALS — BP 130/76 | HR 63 | Ht 72.0 in | Wt 169.2 lb

## 2022-06-20 DIAGNOSIS — I5022 Chronic systolic (congestive) heart failure: Secondary | ICD-10-CM | POA: Diagnosis not present

## 2022-06-20 DIAGNOSIS — I471 Supraventricular tachycardia, unspecified: Secondary | ICD-10-CM

## 2022-06-20 DIAGNOSIS — R079 Chest pain, unspecified: Secondary | ICD-10-CM

## 2022-06-20 NOTE — Progress Notes (Signed)
Clinical Summary Anthony Vaughn is a 52 y.o.male seen today for follow up of the following medical problems.    1. Chronic systolic HF - 07/2019 echo LVEF 40-45%, inferior wall akinetic, basal inferolateral and apical inferior are hypokinetic.   08/2021 echo LVEF 40-45%, grade II dd. The inferior wall is akinetic. The basal inferolateral segment and apical inferior segment are hypokinetic - 08/2019 nuclear stress: large iferior infarct, no ischemia  - presumed ICM based on WMA, nuclear findings     -just taking entresto once daily. -  11/29/21 labs showed significant AKI. Cr up to 2.25 - recent GI issues, some recent decrease intake.  - no recent edema. Some SOB/DOE at times, short to medium distances.       - due to Cr we lowered entresto to 24/26mg  bid on 01/02/22      Jan 2024 echo LVEF 40-45%, grade I dd  - he had started jardiance, reports caused constipation - no recent edema. Breathing is improving.      2. Palpitations/SVT - ER visit yesterday with palpitations, nausea, severe SOB - from notes EMS found him to be in SVT, given rounds of adenosine without improvement - ER notes mention EKG with SVT rate 200 - developed some hypotension, required cardioversion -after cardioversion discussions for transfer to [facility] main campus but patient signed out AMA  - Free T4 0.79 TSH 1.18 Mg 1.6 K 3.7 Cr 1.39 Hstrop 131-->279     - coreg caused SOB and fatigue, toprol much better tolerated.   -rare palpitations, lasts few seconds to minutes.    3. HTN - compliant with meds   4. History of diverticulitis - recent hemicolectomy 07/2019   5. Presumed CAD - 08/2019 nuclear stress: large iferior infarct, no ischemia - 07/2019 echo inferior wall is akinetic.  - 08/2021 echo LVEF 40-45%, grade II dd. The inferior wall is akinetic. The basal inferolateral segment and apical inferior segment are hypokinetic   -some recent chest pains. -used to come about once a month, now increased  in frequency about 1-2 times per week - midhchest/epigastric area, burning/ache 8/10 in severity. Can occur at rest or with activity. Can get diaphoretic but not often. Longest episodes 5 minutes, shortest just a few seconds. No relation to food. Some recent DOE from clinic parking lot which has progressed.    - has some nausea associated with symptoms - episodes 1-2 times per week - ongoing DOE x 1/2 block.  - unable to run on treadmill due SOB, chronic back pain, balance problems, dizziness         6. Hyperlipidemia - changed to crestor 5mg  daily Jan 2023 - side effects on lipitor, stomach discomfort - taking crestor 10mg  daily, reports recently increased by pcp   01/2021 TC 187 TG 50 HDL 68 LDL 109   - 01/02/22 crestor increased to 20mg  daily -12/2021 TC 172 TG 21 HDL 77 LDL 91       Past Medical History:  Diagnosis Date   Arthritis    CAD (coronary artery disease)    Chronic systolic heart failure (HCC)    Diverticulitis    s/p partial colectomy due to stenotic sigmoind colon   Dyspnea    Dysrhythmia    GERD (gastroesophageal reflux disease)    HTN (hypertension)    Myocardial infarction (HCC) 2021   Panic attacks    SVT (supraventricular tachycardia)      Allergies  Allergen Reactions   Other  States he's allergic to a blood pressure pill that causes abdominal pain. He's not sure name of medication     Current Outpatient Medications  Medication Sig Dispense Refill   amLODipine (NORVASC) 10 MG tablet Take 1 tablet (10 mg total) by mouth daily. For Blood Pressure 30 tablet 5   aspirin EC 81 MG tablet Take 1 tablet (81 mg total) by mouth daily. Swallow whole. 90 tablet 3   chlorthalidone (HYGROTON) 25 MG tablet Take 0.5 tablets (12.5 mg total) by mouth daily. 15 tablet 3   docusate sodium (COLACE) 100 MG capsule Take 100 mg by mouth daily as needed for mild constipation.     DULoxetine (CYMBALTA) 30 MG capsule Take 30 mg by mouth daily.     ferrous sulfate  325 (65 FE) MG tablet 1 tablet with meal Orally daily for 30 day(s)     metoprolol succinate (TOPROL-XL) 100 MG 24 hr tablet Take 1 tablet (100 mg total) by mouth daily. Take with or immediately following a meal. 30 tablet 6   ondansetron (ZOFRAN) 4 MG tablet TAKE 1 TABLET BY MOUTH EVERY 8 HOURS AS NEEDED FOR NAUSEA AND VOMITING 30 tablet 0   pantoprazole (PROTONIX) 40 MG tablet Take 1 tablet (40 mg total) by mouth 2 (two) times daily before a meal. 60 tablet 3   polyethylene glycol (MIRALAX / GLYCOLAX) 17 g packet Take 17 g by mouth daily.     rosuvastatin (CRESTOR) 20 MG tablet Take 1 tablet (20 mg total) by mouth daily. 30 tablet 6   sacubitril-valsartan (ENTRESTO) 24-26 MG Take 1 tablet by mouth 2 (two) times daily. 60 tablet 6   spironolactone (ALDACTONE) 25 MG tablet Take 1 tablet (25 mg total) by mouth daily. 90 tablet 3   sucralfate (CARAFATE) 1 g tablet Take 1 tablet (1 g total) by mouth 4 (four) times daily -  with meals and at bedtime. 120 tablet 3   No current facility-administered medications for this visit.     Past Surgical History:  Procedure Laterality Date   BIOPSY  03/04/2019   Procedure: BIOPSY;  Surgeon: Corbin Ade, MD;  [facility];  Service: Endoscopy;;  gastric   BIOPSY  12/01/2021   Procedure: BIOPSY;  Surgeon: Corbin Ade, MD;  [facility];  Service: Endoscopy;;   COLONOSCOPY     COLONOSCOPY WITH PROPOFOL N/A 03/04/2019   Surgeon: Corbin Ade, MD;  Diverticulosis in the entire colon with a stenotic, stiff, chronically inflamed sigmoid segment.  Low threshold for surgical consultation.  Grade 1 internal hemorrhoids. Repeat in 10 years.   COLONOSCOPY WITH PROPOFOL N/A 12/01/2021   Procedure: COLONOSCOPY WITH PROPOFOL;  Surgeon: Corbin Ade, MD;  [facility];  Service: Endoscopy;  Laterality: N/A;  12:45 pm   ESOPHAGOGASTRODUODENOSCOPY (EGD) WITH PROPOFOL N/A 03/04/2019   Surgeon: Corbin Ade, MD;  gastric  mucosal changes found to be chronic gastritis and focal intestinal metaplasia negative for H. 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   ESOPHAGOGASTRODUODENOSCOPY (EGD) WITH PROPOFOL N/A 12/01/2021   Procedure: ESOPHAGOGASTRODUODENOSCOPY (EGD) WITH PROPOFOL;  Surgeon: Corbin Ade, MD;  [facility];  Service: Endoscopy;  Laterality: N/A;   LAPAROSCOPIC PARTIAL COLECTOMY N/A 07/14/2019   Procedure: LAPAROSCOPIC  LEFT HEMICOLECTOMY;  Surgeon: Lucretia Roers, MD;  Location: [facility];  Service: General;  Laterality: N/A;     Allergies  Allergen Reactions   Other  States he's allergic to a blood pressure pill that causes abdominal pain. He's not sure name of medication      Family History  Problem Relation Age of Onset   Lung cancer Father    Colon cancer Neg Hx    Colon polyps Neg Hx      Social History Anthony Vaughn reports that he has been smoking cigarettes. He has a 6.25 pack-year smoking history. He has never used smokeless tobacco. Anthony Vaughn reports no history of alcohol use.   Review of Systems CONSTITUTIONAL: No weight loss, fever, chills, weakness or fatigue.  HEENT: Eyes: No visual loss, blurred vision, double vision or yellow sclerae.No hearing loss, sneezing, congestion, runny nose or sore throat.  SKIN: No rash or itching.  CARDIOVASCULAR: per hpi RESPIRATORY: No shortness of breath, cough or sputum.  GASTROINTESTINAL: No anorexia, nausea, vomiting or diarrhea. No abdominal pain or blood.  GENITOURINARY: No burning on urination, no polyuria NEUROLOGICAL: No headache, dizziness, syncope, paralysis, ataxia, numbness or tingling in the extremities. No change in bowel or bladder control.  MUSCULOSKELETAL: No muscle, back pain, joint pain or stiffness.  LYMPHATICS: No enlarged nodes. No history of splenectomy.  PSYCHIATRIC: No history of depression or anxiety.  ENDOCRINOLOGIC: No reports of  sweating, cold or heat intolerance. No polyuria or polydipsia.  Marland Kitchen   Physical Examination Today's Vitals   06/20/22 0833  BP: 130/76  Pulse: 63  SpO2: 96%  Weight: 169 lb 3.2 oz (76.7 kg)  Height: 6' (1.829 m)   Body mass index is 22.95 kg/m.  Gen: resting comfortably, no acute distress HEENT: no scleral icterus, pupils equal round and reactive, no palptable cervical adenopathy,  CV:RRR, no mrg, no jvd Resp: Clear to auscultation bilaterally GI: abdomen is soft, non-tender, non-distended, normal bowel sounds, no hepatosplenomegaly MSK: extremities are warm, no edema.  Skin: warm, no rash Neuro:  no focal deficits Psych: appropriate affect   Diagnostic Studies  Jan 2024 echo IMPRESSIONS     1. Left ventricular ejection fraction, by estimation, is 40 to 45%. The  left ventricle has mildly decreased function. The left ventricle  demonstrates regional wall motion abnormalities (see scoring  diagram/findings for description). The left ventricular   internal cavity size was mildly to moderately dilated. There is mild left  ventricular hypertrophy. Left ventricular diastolic parameters are  consistent with Grade I diastolic dysfunction (impaired relaxation). The  average left ventricular global  longitudinal strain is -10.7 %. The global longitudinal strain is  abnormal.   2. Right ventricular systolic function is mildly reduced. The right  ventricular size is normal. Tricuspid regurgitation signal is inadequate  for assessing PA pressure.   3. Left atrial size was mildly dilated.   4. The mitral valve is grossly normal. No evidence of mitral valve  regurgitation. No evidence of mitral stenosis.   5. The aortic valve is tricuspid. Aortic valve regurgitation is not  visualized. No aortic stenosis is present.   6. Aortic dilatation noted. There is mild dilatation of the aortic root,  measuring 40 mm.   7. The inferior vena cava is dilated in size with >50% respiratory   variability, suggesting right atrial pressure of 8 mmHg.    Assessment and Plan   SVT - controlled, continue metoprolol   2. Chronic systolic HF - mild LV systolic dysfunction by echo - EKG, echo, nuclear stress  would suggest potential prior inferior infarct - significant AKI on higher entresto, resolved on lower dosing - jardiance caused constipation, now off -  euvolemic today, continue current meds     3. Chest pain - ongoing chest pains concerning for possible angina - order lexiscan. Cannot run on treadmill due to back pain              Antoine Poche, M.D.

## 2022-06-20 NOTE — Patient Instructions (Signed)
Medication Instructions:  Continue all current medications.  Labwork: none  Testing/Procedures: Your physician has requested that you have a lexiscan myoview. For further information please visit [email or web address]. Please follow instruction sheet, as given.  Office will contact with results via phone, letter or mychart.     Follow-Up: 6 months   Any Other Special Instructions Will Be Listed Below (If Applicable).   If you need a refill on your cardiac medications before your next appointment, please call your pharmacy.  

## 2022-06-26 ENCOUNTER — Encounter (HOSPITAL_COMMUNITY): Payer: Medicaid Other

## 2022-06-29 ENCOUNTER — Encounter: Payer: Medicaid Other | Admitting: Gastroenterology

## 2022-07-14 ENCOUNTER — Telehealth: Payer: Self-pay | Admitting: Gastroenterology

## 2022-07-14 NOTE — Telephone Encounter (Signed)
We have been waiting for patient to complete cardiac workup chest pain and shortness of breath prior to rescheduling EGD.  Patient was scheduled for stress test on 5/20, but this was not completed for reasons that are unclear to me.   Can you reach out to patient to see why he did not have the stress test completed and what the current plan is?  We are not able to proceed with repeating his upper endoscopy until cardiac evaluation has been completed.

## 2022-07-17 ENCOUNTER — Encounter: Payer: Self-pay | Admitting: *Deleted

## 2022-07-17 NOTE — Telephone Encounter (Signed)
Spoke to pt, he informed me that his stress test was cancelled due to insurance. Pt states he has been trying to do an appeal and has heard nothing from them. I informed him to call his insurance and find out what is going on. Pt states he is having a hard time swallowing and is in pain.

## 2022-07-17 NOTE — Telephone Encounter (Signed)
I am sorry he is having a hard time. These are the same symptoms he has been dealing with for a while. We need to proceed with the EGD as soon as possible, once cleared by cardiology. Reviewed most recent OV with cardiology in May and Dr. Wyline Mood again recommended lexiscan. He needs to reach out to insurance and Dr. Reginia Naas office to see what can be done about this or what other option he has for evaluation.   For now, he will need to continue his current medications PPI twice daily, carafate QID, and viscous lidocaine as needed.

## 2022-07-18 NOTE — Telephone Encounter (Signed)
Reviewed MyChart message. Patient stated he would keep [place] updated on the process of getting the lexiscan appealed.

## 2022-07-18 NOTE — Telephone Encounter (Signed)
Sent pt a MyChart message, informed him of recommendations. He sent me a message back saying he went by the cardiologist office.

## 2022-08-15 ENCOUNTER — Encounter: Payer: Self-pay | Admitting: *Deleted

## 2022-08-16 ENCOUNTER — Other Ambulatory Visit: Payer: Self-pay | Admitting: Gastroenterology

## 2022-08-16 ENCOUNTER — Encounter: Payer: Self-pay | Admitting: *Deleted

## 2022-08-16 ENCOUNTER — Telehealth: Payer: Self-pay | Admitting: *Deleted

## 2022-08-16 DIAGNOSIS — K625 Hemorrhage of anus and rectum: Secondary | ICD-10-CM

## 2022-08-16 DIAGNOSIS — R1013 Epigastric pain: Secondary | ICD-10-CM

## 2022-08-16 DIAGNOSIS — K649 Unspecified hemorrhoids: Secondary | ICD-10-CM

## 2022-08-16 MED ORDER — HYDROCORTISONE (PERIANAL) 2.5 % EX CREA
1.0000 | TOPICAL_CREAM | Freq: Two times a day (BID) | CUTANEOUS | 1 refills | Status: DC
Start: 2022-08-16 — End: 2023-08-30

## 2022-08-16 MED ORDER — LIDOCAINE VISCOUS HCL 2 % MT SOLN
OROMUCOSAL | 0 refills | Status: DC
Start: 2022-08-16 — End: 2022-09-11

## 2022-08-16 NOTE — Telephone Encounter (Signed)
See separate patient message.

## 2022-08-16 NOTE — Telephone Encounter (Signed)
If he is having severe symptoms, feel lightheaded, likely he may pass out, he should proceed to the emergency room.   Otherwise, he has an appointment tomorrow and should keep this so we can discuss further or order any labs that are needed.   He should be taking Pantoprazole twice daily, carafate QID, and viscous lidocaine. Please verify this.   He also needs to be sure he is avoiding all NSAID products.   To help prevent foods from getting stuck in his esophagus:  Eat slowly, take small bites, chew thoroughly, drink plenty of liquids throughout meals.  Avoid trough textures All meats should be chopped finely.  If something gets stuck in his esophagus and will not come up or go down, proceed to the emergency room.

## 2022-08-16 NOTE — Telephone Encounter (Signed)
Sent pt a MyChart message

## 2022-08-16 NOTE — Telephone Encounter (Signed)
Pt called and states that his stomach hurts and burns all the time. He states he is nauseated and is having black stools and blood when he wipes. Food is starting to get stuck in his his throat again.

## 2022-08-17 ENCOUNTER — Encounter: Payer: Medicaid Other | Admitting: Gastroenterology

## 2022-09-10 ENCOUNTER — Other Ambulatory Visit: Payer: Self-pay | Admitting: Gastroenterology

## 2022-09-10 DIAGNOSIS — R1013 Epigastric pain: Secondary | ICD-10-CM

## 2022-09-25 ENCOUNTER — Encounter: Payer: Self-pay | Admitting: Cardiology

## 2022-10-04 ENCOUNTER — Ambulatory Visit: Payer: Medicaid Other | Attending: Cardiology | Admitting: Cardiology

## 2022-10-04 ENCOUNTER — Encounter: Payer: Self-pay | Admitting: Cardiology

## 2022-10-04 VITALS — BP 130/86 | HR 79 | Ht 72.0 in | Wt 169.2 lb

## 2022-10-04 DIAGNOSIS — I471 Supraventricular tachycardia, unspecified: Secondary | ICD-10-CM | POA: Diagnosis not present

## 2022-10-04 DIAGNOSIS — R079 Chest pain, unspecified: Secondary | ICD-10-CM | POA: Diagnosis not present

## 2022-10-04 NOTE — Progress Notes (Signed)
Clinical Summary Anthony Vaughn is a 52 y.o.male seen today for follow up of the following medical problems.   This is a focused visit on recent symptoms of chest pain.   1. Presumed CAD - 08/2019 nuclear stress: large iferior infarct, no ischemia - 07/2019 echo inferior wall is akinetic.  - 08/2021 echo LVEF 40-45%, grade II dd. The inferior wall is akinetic. The basal inferolateral segment and apical inferior segment are hypokinetic  -ongoing chest pains - we had ordered a stress test as far back as January but insurance denied that and repeat attempt - since that time symptoms have worsened.    -2 weeks ago out mowing grass using push mower. Midchest, sharp 9/10 pain. Some nausea, some SOB. Stopped and rested inside house. Ongoing pain, lasted 20 minutes. Pain resolved on its own. Pain comes and goes since that time, typically with activity. Ongoing DOE that has progressed over the last few months   - unable to run on treadmill due SOB, chronic back pain, balance problems, dizziness     Other medical problems not reviewed this visit    1. Chronic systolic HF - 07/2019 echo LVEF 40-45%, inferior wall akinetic, basal inferolateral and apical inferior are hypokinetic.   08/2021 echo LVEF 40-45%, grade II dd. The inferior wall is akinetic. The basal inferolateral segment and apical inferior segment are hypokinetic - 08/2019 nuclear stress: large iferior infarct, no ischemia  - presumed ICM based on WMA, nuclear findings     -just taking entresto once daily. -  11/29/21 labs showed significant AKI. Cr up to 2.25 - recent GI issues, some recent decrease intake.  - no recent edema. Some SOB/DOE at times, short to medium distances.        - due to Cr we lowered entresto to 24/26mg  bid on 01/02/22      Jan 2024 echo LVEF 40-45%, grade I dd  - he had started jardiance, reports caused constipation - no recent edema. Breathing is improving.      2. Palpitations/SVT - ER visit  yesterday with palpitations, nausea, severe SOB - from notes EMS found him to be in SVT, given rounds of adenosine without improvement - ER notes mention EKG with SVT rate 200 - developed some hypotension, required cardioversion -after cardioversion discussions for transfer to [facility] main campus but patient signed out AMA  - Free T4 0.79 TSH 1.18 Mg 1.6 K 3.7 Cr 1.39 Hstrop 131-->279     - coreg caused SOB and fatigue, toprol much better tolerated.   -rare palpitations, lasts few seconds to minutes.      3. HTN - compliant with meds   4. History of diverticulitis - recent hemicolectomy 07/2019    5. Hyperlipidemia - changed to crestor 5mg  daily Jan 2023 - side effects on lipitor, stomach discomfort - taking crestor 10mg  daily, reports recently increased by pcp   01/2021 TC 187 TG 50 HDL 68 LDL 109   - 01/02/22 crestor increased to 20mg  daily -12/2021 TC 172 TG 21 HDL 77 LDL 91 Past Medical History:  Diagnosis Date   Arthritis    CAD (coronary artery disease)    Chronic systolic heart failure (HCC)    Diverticulitis    s/p partial colectomy due to stenotic sigmoind colon   Dyspnea    Dysrhythmia    GERD (gastroesophageal reflux disease)    HTN (hypertension)    Myocardial infarction (HCC) 2021   Panic attacks    SVT (supraventricular tachycardia)  Allergies  Allergen Reactions   Other     States he's allergic to a blood pressure pill that causes abdominal pain. He's not sure name of medication     Current Outpatient Medications  Medication Sig Dispense Refill   hydrocortisone (ANUSOL-HC) 2.5 % rectal cream Place 1 Application rectally 2 (two) times daily. 30 g 1   amLODipine (NORVASC) 10 MG tablet Take 1 tablet (10 mg total) by mouth daily. For Blood Pressure 30 tablet 5   aspirin EC 81 MG tablet Take 1 tablet (81 mg total) by mouth daily. Swallow whole. 90 tablet 3   chlorthalidone (HYGROTON) 25 MG tablet Take 0.5 tablets (12.5 mg total) by mouth daily. 15  tablet 3   docusate sodium (COLACE) 100 MG capsule Take 100 mg by mouth daily as needed for mild constipation.     DULoxetine (CYMBALTA) 30 MG capsule Take 30 mg by mouth daily.     ferrous sulfate 325 (65 FE) MG tablet 1 tablet with meal Orally daily for 30 day(s)     lidocaine (XYLOCAINE) 2 % solution place 15 mls ON back of THE TONGUE AND swallow EVERY 6 HOURS AS NEEDED FOR ABDOMINAL pain 300 mL 1   metoprolol succinate (TOPROL-XL) 100 MG 24 hr tablet Take 1 tablet (100 mg total) by mouth daily. Take with or immediately following a meal. 30 tablet 6   ondansetron (ZOFRAN) 4 MG tablet TAKE 1 TABLET BY MOUTH EVERY 8 HOURS AS NEEDED FOR NAUSEA AND VOMITING 30 tablet 0   pantoprazole (PROTONIX) 40 MG tablet Take 1 tablet (40 mg total) by mouth 2 (two) times daily before a meal. 60 tablet 3   polyethylene glycol (MIRALAX / GLYCOLAX) 17 g packet Take 17 g by mouth daily.     rosuvastatin (CRESTOR) 20 MG tablet Take 1 tablet (20 mg total) by mouth daily. 30 tablet 6   sacubitril-valsartan (ENTRESTO) 24-26 MG Take 1 tablet by mouth 2 (two) times daily. 60 tablet 6   spironolactone (ALDACTONE) 25 MG tablet Take 1 tablet (25 mg total) by mouth daily. 90 tablet 3   sucralfate (CARAFATE) 1 g tablet Take 1 tablet (1 g total) by mouth 4 (four) times daily -  with meals and at bedtime. 120 tablet 3   No current facility-administered medications for this visit.     Past Surgical History:  Procedure Laterality Date   BIOPSY  03/04/2019   Procedure: BIOPSY;  Surgeon: Corbin Ade, MD;  [facility];  Service: Endoscopy;;  gastric   BIOPSY  12/01/2021   Procedure: BIOPSY;  Surgeon: Corbin Ade, MD;  [facility];  Service: Endoscopy;;   COLONOSCOPY     COLONOSCOPY WITH PROPOFOL N/A 03/04/2019   Surgeon: Corbin Ade, MD;  Diverticulosis in the entire colon with a stenotic, stiff, chronically inflamed sigmoid segment.  Low threshold for surgical consultation.  Grade 1  internal hemorrhoids. Repeat in 10 years.   COLONOSCOPY WITH PROPOFOL N/A 12/01/2021   Procedure: COLONOSCOPY WITH PROPOFOL;  Surgeon: Corbin Ade, MD;  [facility];  Service: Endoscopy;  Laterality: N/A;  12:45 pm   ESOPHAGOGASTRODUODENOSCOPY (EGD) WITH PROPOFOL N/A 03/04/2019   Surgeon: Corbin Ade, MD;  gastric mucosal changes found to be chronic gastritis and focal intestinal metaplasia negative for H. 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   ESOPHAGOGASTRODUODENOSCOPY (EGD) WITH PROPOFOL N/A 12/01/2021   Procedure: ESOPHAGOGASTRODUODENOSCOPY (EGD) WITH PROPOFOL;  Surgeon:  Rourk, Gerrit Friends, MD;  [facility];  Service: Endoscopy;  Laterality: N/A;   LAPAROSCOPIC PARTIAL COLECTOMY N/A 07/14/2019   Procedure: LAPAROSCOPIC  LEFT HEMICOLECTOMY;  Surgeon: Lucretia Roers, MD;  Location: [facility];  Service: General;  Laterality: N/A;     Allergies  Allergen Reactions   Other     States he's allergic to a blood pressure pill that causes abdominal pain. He's not sure name of medication      Family History  Problem Relation Age of Onset   Lung cancer Father    Colon cancer Neg Hx    Colon polyps Neg Hx      Social History Anthony Vaughn reports that he has been smoking cigarettes. He has a 6.3 pack-year smoking history. He has never used smokeless tobacco. Anthony Vaughn reports no history of alcohol use.   Review of Systems CONSTITUTIONAL: No weight loss, fever, chills, weakness or fatigue.  HEENT: Eyes: No visual loss, blurred vision, double vision or yellow sclerae.No hearing loss, sneezing, congestion, runny nose or sore throat.  SKIN: No rash or itching.  CARDIOVASCULAR: per hpi RESPIRATORY: per hpi GASTROINTESTINAL: No anorexia, nausea, vomiting or diarrhea. No abdominal pain or blood.  GENITOURINARY: No burning on urination, no polyuria NEUROLOGICAL: No headache, dizziness, syncope,  paralysis, ataxia, numbness or tingling in the extremities. No change in bowel or bladder control.  MUSCULOSKELETAL: No muscle, back pain, joint pain or stiffness.  LYMPHATICS: No enlarged nodes. No history of splenectomy.  PSYCHIATRIC: No history of depression or anxiety.  ENDOCRINOLOGIC: No reports of sweating, cold or heat intolerance. No polyuria or polydipsia.  Marland Kitchen   Physical Examination Today's Vitals   10/04/22 1422  BP: 130/86  Pulse: 79  Weight: 169 lb 3.2 oz (76.7 kg)  Height: 6' (1.829 m)   Body mass index is 22.95 kg/m.  Gen: resting comfortably, no acute distress HEENT: no scleral icterus, pupils equal round and reactive, no palptable cervical adenopathy,  CV: RRR, no m/rg, no jvd Resp: Clear to auscultation bilaterally GI: abdomen is soft, non-tender, non-distended, normal bowel sounds, no hepatosplenomegaly MSK: extremities are warm, no edema.  Skin: warm, no rash Neuro:  no focal deficits Psych: appropriate affect   Diagnostic Studies Jan 2024 echo IMPRESSIONS     1. Left ventricular ejection fraction, by estimation, is 40 to 45%. The  left ventricle has mildly decreased function. The left ventricle  demonstrates regional wall motion abnormalities (see scoring  diagram/findings for description). The left ventricular   internal cavity size was mildly to moderately dilated. There is mild left  ventricular hypertrophy. Left ventricular diastolic parameters are  consistent with Grade I diastolic dysfunction (impaired relaxation). The  average left ventricular global  longitudinal strain is -10.7 %. The global longitudinal strain is  abnormal.   2. Right ventricular systolic function is mildly reduced. The right  ventricular size is normal. Tricuspid regurgitation signal is inadequate  for assessing PA pressure.   3. Left atrial size was mildly dilated.   4. The mitral valve is grossly normal. No evidence of mitral valve  regurgitation. No evidence of mitral  stenosis.   5. The aortic valve is tricuspid. Aortic valve regurgitation is not  visualized. No aortic stenosis is present.   6. Aortic dilatation noted. There is mild dilatation of the aortic root,  measuring 40 mm.   7. The inferior vena cava is dilated in size with >50% respiratory  variability, suggesting right atrial pressure of 8 mmHg.  Assessment and Plan   1.Chest pain - his chest pain and DOE are concerning for angina. They have progressed over the last few months, his insurance denied his prior stress tests and symptoms have worsened since then. - I actually recommended cath today but patient not in favor and prefers to retry the stress test - he is not able to run on a treadmill due to chronic back pain, balance issues, and SOB - lexiscan for chest pain concerning for angina. EKG today shows SR, chronic ST/T changes      Antoine Poche, M.D

## 2022-10-04 NOTE — Patient Instructions (Signed)
Medication Instructions:  Your physician recommends that you continue on your current medications as directed. Please refer to the Current Medication list given to you today.  *If you need a refill on your cardiac medications before your next appointment, please call your pharmacy*   Lab Work: None If you have labs (blood work) drawn today and your tests are completely normal, you will receive your results only by: MyChart Message (if you have MyChart) OR A paper copy in the mail If you have any lab test that is abnormal or we need to change your treatment, we will call you to review the results.   Testing/Procedures: Your physician has requested that you have a lexiscan myoview. For further information please visit [email or web address] Please follow instruction sheet, as given.    Follow-Up: At [facility], you and your health needs are our priority.  As part of our continuing mission to provide you with exceptional heart care, we have created designated Provider Care Teams.  These Care Teams include your primary Cardiologist (physician) and Advanced Practice Providers (APPs -  Physician Assistants and Nurse Practitioners) who all work together to provide you with the care you need, when you need it.  We recommend signing up for the patient portal called "MyChart".  Sign up information is provided on this After Visit Summary.  MyChart is used to connect with patients for Virtual Visits (Telemedicine).  Patients are able to view lab/test results, encounter notes, upcoming appointments, etc.  Non-urgent messages can be sent to your provider as well.   To learn more about what you can do with MyChart, go to [email or web address].    Your next appointment:    Keep scheduled follow up   Provider:   Dina Rich, MD    Other Instructions

## 2022-10-05 ENCOUNTER — Encounter: Payer: Medicaid Other | Admitting: Gastroenterology

## 2022-10-16 ENCOUNTER — Encounter (HOSPITAL_COMMUNITY): Payer: Medicaid Other

## 2022-10-16 ENCOUNTER — Encounter (HOSPITAL_COMMUNITY): Admission: RE | Admit: 2022-10-16 | Payer: Medicaid Other | Source: Ambulatory Visit

## 2022-10-24 ENCOUNTER — Ambulatory Visit: Payer: Medicaid Other | Admitting: Urology

## 2022-10-31 ENCOUNTER — Other Ambulatory Visit: Payer: Self-pay | Admitting: Gastroenterology

## 2022-10-31 DIAGNOSIS — R1013 Epigastric pain: Secondary | ICD-10-CM

## 2022-11-16 ENCOUNTER — Encounter: Payer: Medicaid Other | Admitting: Gastroenterology

## 2022-11-20 ENCOUNTER — Encounter: Payer: Self-pay | Admitting: Cardiology

## 2022-12-05 ENCOUNTER — Ambulatory Visit: Payer: Medicaid Other | Admitting: Urology

## 2022-12-05 VITALS — BP 162/103 | HR 69

## 2022-12-05 DIAGNOSIS — R972 Elevated prostate specific antigen [PSA]: Secondary | ICD-10-CM | POA: Diagnosis not present

## 2022-12-05 LAB — URINALYSIS, ROUTINE W REFLEX MICROSCOPIC
Bilirubin, UA: NEGATIVE
Glucose, UA: NEGATIVE
Ketones, UA: NEGATIVE
Leukocytes,UA: NEGATIVE
Nitrite, UA: NEGATIVE
Protein,UA: NEGATIVE
RBC, UA: NEGATIVE
Specific Gravity, UA: 1.01 (ref 1.005–1.030)
Urobilinogen, Ur: 0.2 mg/dL (ref 0.2–1.0)
pH, UA: 6.5 (ref 5.0–7.5)

## 2022-12-05 MED ORDER — LEVOFLOXACIN 750 MG PO TABS
750.0000 mg | ORAL_TABLET | Freq: Once | ORAL | 0 refills | Status: AC
Start: 2022-12-05 — End: 2022-12-05

## 2022-12-05 MED ORDER — LEVOFLOXACIN 750 MG PO TABS: 750.0000 mg | ORAL_TABLET | Freq: Every day | ORAL | 0 refills | Status: DC

## 2022-12-05 NOTE — Progress Notes (Addendum)
History of Present Illness:   3.5.2024: Initial visit for E/M of elevated PSA. Only data I have is from December 2023--9.9 w/ 9% free fraction. Repeat PSA 8.7.   TRUS/Bx scheduled but not done.  He says that he had his insurance company declined coverage.  10.29.2024: He comes in today for repeat check.  He has not had a PSA checked since 6 months ago.  Complaining of significant lower urinary tract symptoms-frequency, urgency, nocturia.  IPSS 30/5.  Not on any bladder/prostate medications.    Past Medical History:  Diagnosis Date   Arthritis    CAD (coronary artery disease)    Chronic systolic heart failure (HCC)    Diverticulitis    s/p partial colectomy due to stenotic sigmoind colon   Dyspnea    Dysrhythmia    GERD (gastroesophageal reflux disease)    HTN (hypertension)    Myocardial infarction (HCC) 2021   Panic attacks    SVT (supraventricular tachycardia)     Past Surgical History:  Procedure Laterality Date   BIOPSY  03/04/2019   Procedure: BIOPSY;  Surgeon: Corbin Ade, MD;  [facility];  Service: Endoscopy;;  gastric   BIOPSY  12/01/2021   Procedure: BIOPSY;  Surgeon: Corbin Ade, MD;  [facility];  Service: Endoscopy;;   COLONOSCOPY     COLONOSCOPY WITH PROPOFOL N/A 03/04/2019   Surgeon: Corbin Ade, MD;  Diverticulosis in the entire colon with a stenotic, stiff, chronically inflamed sigmoid segment.  Low threshold for surgical consultation.  Grade 1 internal hemorrhoids. Repeat in 10 years.   COLONOSCOPY WITH PROPOFOL N/A 12/01/2021   Procedure: COLONOSCOPY WITH PROPOFOL;  Surgeon: Corbin Ade, MD;  [facility];  Service: Endoscopy;  Laterality: N/A;  12:45 pm   ESOPHAGOGASTRODUODENOSCOPY (EGD) WITH PROPOFOL N/A 03/04/2019   Surgeon: Corbin Ade, MD;  gastric mucosal changes found to be chronic gastritis and focal intestinal metaplasia negative for H. 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   ESOPHAGOGASTRODUODENOSCOPY (EGD) WITH PROPOFOL N/A 12/01/2021   Procedure: ESOPHAGOGASTRODUODENOSCOPY (EGD) WITH PROPOFOL;  Surgeon: Corbin Ade, MD;  [facility];  Service: Endoscopy;  Laterality: N/A;   LAPAROSCOPIC PARTIAL COLECTOMY N/A 07/14/2019   Procedure: LAPAROSCOPIC  LEFT HEMICOLECTOMY;  Surgeon: Lucretia Roers, MD;  Location: [facility];  Service: General;  Laterality: N/A;    Home Medications:  Allergies as of 12/05/2022       Reactions   Other    States he's allergic to a blood pressure pill that causes abdominal pain. He's not sure name of medication        Medication List        Accurate as of December 05, 2022  8:18 AM. If you have any questions, ask your nurse or doctor.          amLODipine 10 MG tablet Commonly known as: NORVASC Take 1 tablet (10 mg total) by mouth daily. For Blood Pressure   aspirin EC 81 MG tablet Take 1 tablet (81 mg total) by mouth daily. Swallow whole.   chlorthalidone 25 MG tablet Commonly known as: HYGROTON Take 0.5 tablets (12.5 mg total) by mouth daily.   docusate sodium 100 MG capsule Commonly known as: COLACE Take 100 mg by mouth daily as needed for mild constipation.   DULoxetine 30 MG capsule Commonly known as: CYMBALTA Take 30 mg by mouth daily.   Entresto 24-26 MG Generic drug:  sacubitril-valsartan Take 1 tablet by mouth 2 (two) times daily.   ferrous sulfate 325 (65 FE) MG tablet 1 tablet with meal Orally daily for 30 day(s)   hydrocortisone 2.5 % rectal cream Commonly known as: Anusol-HC Place 1 Application rectally 2 (two) times daily.   lidocaine 2 % solution Commonly known as: XYLOCAINE place 15 mls ON back of THE TONGUE AND swallow EVERY 6 HOURS AS NEEDED FOR ABDOMINAL pain   metoprolol succinate 100 MG 24 hr tablet Commonly known as: TOPROL-XL Take 1 tablet (100 mg total) by mouth daily. Take with or immediately following a meal.    ondansetron 4 MG tablet Commonly known as: ZOFRAN TAKE 1 TABLET BY MOUTH EVERY 8 HOURS AS NEEDED FOR NAUSEA AND VOMITING   pantoprazole 40 MG tablet Commonly known as: Protonix Take 1 tablet (40 mg total) by mouth 2 (two) times daily before a meal.   polyethylene glycol 17 g packet Commonly known as: MIRALAX / GLYCOLAX Take 17 g by mouth daily.   rosuvastatin 20 MG tablet Commonly known as: CRESTOR Take 1 tablet (20 mg total) by mouth daily.   spironolactone 25 MG tablet Commonly known as: ALDACTONE Take 1 tablet (25 mg total) by mouth daily.   sucralfate 1 g tablet Commonly known as: CARAFATE TAKE 1 TABLET BY MOUTH FOUR TIMES DAILY WITH MEALS AND AT BEDTIME   Vitamin D (Ergocalciferol) 50000 units Caps Take 1 capsule by mouth once a week.        Allergies:  Allergies  Allergen Reactions   Other     States he's allergic to a blood pressure pill that causes abdominal pain. He's not sure name of medication    Family History  Problem Relation Age of Onset   Lung cancer Father    Colon cancer Neg Hx    Colon polyps Neg Hx     Social History:  reports that he has been smoking cigarettes. He has a 6.3 pack-year smoking history. He has never used smokeless tobacco. He reports that he does not drink alcohol and does not use drugs.  ROS: A complete review of systems was performed.  All systems are negative except for pertinent findings as noted.  Physical Exam:  Vital signs in last 24 hours: There were no vitals taken for this visit. Constitutional:  Alert and oriented, No acute distress Cardiovascular: Regular rate  Respiratory: Normal respiratory effort Neurologic: Grossly intact, no focal deficits Psychiatric: Normal mood and affect  I have reviewed prior pt notes  I have reviewed urinalysis results-clear  I have independently reviewed prior imaging--prostate small on CT scan from 2022.  I have reviewed prior PSA results  IPSS sheet reviewed  Bladder  scan volume 0     Impression/Assessment:  1.  Elevated PSA in a 52 year old male.  He did not have a biopsy as scheduled 6 months ago  2.  Significant lower urinary tract symptoms.  He empties out well  Plan:  1.  I will schedule him for ultrasound and biopsy.  Instructions given  2.  I will send him out with 3 weeks samples of Myrbetriq 25 mg daily  3.  PSA was checked today

## 2022-12-05 NOTE — Addendum Note (Signed)
Addended by: Gustavus Messing on: 12/05/2022 03:59 PM   Modules accepted: Orders

## 2022-12-05 NOTE — Patient Instructions (Addendum)
  Appointment Time: please arrive @ 8:00 a.m. Your biopsy will begin @ 8:15 a.m. Appointment Date: 02/20/2023  Location: [facility]   Prostate Biopsy Instructions  Stop all aspirin or blood thinners (aspirin, plavix, coumadin, warfarin, motrin, ibuprofen, advil, aleve, naproxen, naprosyn) for 7 days prior to the procedure.  If you have any questions about stopping these medications, please contact your primary care physician or cardiologist.  Having a light meal prior to the procedure is recommended.  If you are diabetic or have low blood sugar please bring a small snack or glucose tablet.  A Fleets enema is needed to be purchased over the counter at a local pharmacy and used 2 hours before you scheduled appointment.  This can be purchased over the counter at any pharmacy.  Antibiotics will be administered in the clinic at the time of the procedure and 1 tablet has been sent to your pharmacy. Please take the antibiotic as prescribed.    Please bring someone with you to the procedure to drive you home if you are given a valium to take prior to your procedure.   If you have any questions or concerns, please feel free to call the office at [phone] or send a Mychart message.    Thank you, [facility]

## 2022-12-06 ENCOUNTER — Other Ambulatory Visit: Payer: Self-pay | Admitting: Urology

## 2022-12-06 DIAGNOSIS — R972 Elevated prostate specific antigen [PSA]: Secondary | ICD-10-CM

## 2022-12-07 ENCOUNTER — Telehealth: Payer: Self-pay | Admitting: Cardiology

## 2022-12-07 NOTE — Telephone Encounter (Signed)
Pre-operative Risk Assessment    Patient Name: Anthony Vaughn  DOB: January 30, 1971 MRN: [id number]{       Request for Surgical Clearance{ 1. What type of surgery is being performed? Enter name of procedure below and number of teeth if dental extraction.    Procedure:   PROSTATE BIOPSY  Date of Surgery:  Clearance 02/20/23                                 Surgeon:  Retta Diones Surgeon's Group or Practice Name:  [facility] Phone number:  [phone] Fax number:  [phone]   Type of Clearance Requested:   - Medical  - Pharmacy:  Hold Aspirin     Type of Anesthesia:  Not Indicated   Additional requests/questions:  Please advise surgeon/provider what medications should be held.  Signed, Vallarie Mare   12/07/2022, 11:55 AM

## 2022-12-08 NOTE — Telephone Encounter (Addendum)
Will update all partied involved pt has appt with Dr. Wyline Mood 12/25/22

## 2022-12-08 NOTE — Telephone Encounter (Signed)
   Name: Anthony Vaughn  DOB: 1970-04-19  MRN: [id number]  Primary Cardiologist: Dina Rich, MD  Chart reviewed as part of pre-operative protocol coverage. Because of Yaziel Jarriel's past medical history and time since last visit, he will require a follow-up in-office visit in order to better assess preoperative cardiovascular risk.  Patient has an office visit scheduled on 12/25/2022 with Dr. Wyline Mood. Appointment notes have been updated to reflect need for pre-op evaluation.   Pre-op covering staff:  - Please contact requesting surgeon's office via preferred method (i.e, phone, fax) to inform them of need for appointment prior to surgery.   Carlos Levering, NP  12/08/2022, 1:00 PM

## 2022-12-12 ENCOUNTER — Other Ambulatory Visit: Payer: Self-pay | Admitting: Cardiology

## 2022-12-25 ENCOUNTER — Encounter: Payer: Self-pay | Admitting: Cardiology

## 2022-12-25 ENCOUNTER — Ambulatory Visit: Payer: MEDICAID | Attending: Cardiology | Admitting: Cardiology

## 2022-12-25 VITALS — BP 138/96 | HR 85 | Ht 72.0 in | Wt 178.6 lb

## 2022-12-25 DIAGNOSIS — I471 Supraventricular tachycardia, unspecified: Secondary | ICD-10-CM

## 2022-12-25 MED ORDER — SPIRONOLACTONE 25 MG PO TABS: 12.5000 mg | ORAL_TABLET | Freq: Every day | ORAL | Status: AC

## 2022-12-25 NOTE — Addendum Note (Signed)
Addended by: Lesle Chris on: 12/25/2022 09:35 AM   Modules accepted: Orders

## 2022-12-25 NOTE — Patient Instructions (Signed)
Medication Instructions:   Decrease Aldactone to 12.5mg  daily  Continue all other medications.     Labwork:  none  Testing/Procedures:  none  Follow-Up:  3 months   Any Other Special Instructions Will Be Listed Below (If Applicable).   If you need a refill on your cardiac medications before your next appointment, please call your pharmacy.

## 2022-12-25 NOTE — Progress Notes (Signed)
Clinical Summary Anthony Vaughn is a 52 y.o.male seen today for follow up of the following medical problems.       1. Presumed CAD - 08/2019 nuclear stress: large iferior infarct, no ischemia - 07/2019 echo inferior wall is akinetic.  - 08/2021 echo LVEF 40-45%, grade II dd. The inferior wall is akinetic. The basal inferolateral segment and apical inferior segment are hypokinetic    -at prior visit reported episode of chest pain wile  out mowing grass using push mower. Midchest, sharp 9/10 pain. Some nausea, some SOB. Stopped and rested inside house. Ongoing pain, lasted 20 minutes. Pain resolved on its own. Pain comes and goes since that time, typically with activity. Ongoing DOE that has progressed over the last few months     - unable to run on treadmill due SOB, chronic back pain, balance problems, dizziness  - still with chest pains at times. Episodes occur about once a week. - some decrease in frequency and severity since last visit - his insurance company denies a stress test 3 different times.     2. Dizziness - was at home sitting watching TV. Stood up and felt very dizzy, lightheaded and fell - reports drinks at least 4-6 bottles of water daily.           3. Chronic systolic HF - 07/2019 echo LVEF 40-45%, inferior wall akinetic, basal inferolateral and apical inferior are hypokinetic.   08/2021 echo LVEF 40-45%, grade II dd. The inferior wall is akinetic. The basal inferolateral segment and apical inferior segment are hypokinetic - 08/2019 nuclear stress: large iferior infarct, no ischemia  - presumed ICM based on WMA, nuclear findings   - due to Cr we lowered entresto to 24/26mg  bid on 01/02/22      Jan 2024 echo LVEF 40-45%, grade I dd  - he had started jardiance, reports caused constipation - ongoing SOB/DOE, no recent edema - complian twith meds   4. Palpitations/SVT - ER visit yesterday with palpitations, nausea, severe SOB - from notes EMS found him to  be in SVT, given rounds of adenosine without improvement - ER notes mention EKG with SVT rate 200 - developed some hypotension, required cardioversion -after cardioversion discussions for transfer to [facility] main campus but patient signed out AMA  - Free T4 0.79 TSH 1.18 Mg 1.6 K 3.7 Cr 1.39 Hstrop 131-->279     - coreg caused SOB and fatigue, toprol much better tolerated.  - some palpitations at times, overall not bothersome       5. HTN - compliant with meds   6. History of diverticulitis - hemicolectomy 07/2019     7. Hyperlipidemia - changed to crestor 5mg  daily Jan 2023 - side effects on lipitor, stomach discomfort - taking crestor 10mg  daily, reports recently increased by pcp   01/2021 TC 187 TG 50 HDL 68 LDL 109   - 01/02/22 crestor increased to 20mg  daily -12/2021 TC 172 TG 21 HDL 77 LDL 91  - upcoming labs Thursday with pcp   6. Preoperative evaluation - considering prostate biopsy Past Medical History:  Diagnosis Date   Arthritis    CAD (coronary artery disease)    Chronic systolic heart failure (HCC)    Diverticulitis    s/p partial colectomy due to stenotic sigmoind colon   Dyspnea    Dysrhythmia    GERD (gastroesophageal reflux disease)    HTN (hypertension)    Myocardial infarction (HCC) 2021   Panic attacks  SVT (supraventricular tachycardia)      Allergies  Allergen Reactions   Other     States he's allergic to a blood pressure pill that causes abdominal pain. He's not sure name of medication     Current Outpatient Medications  Medication Sig Dispense Refill   amLODipine (NORVASC) 10 MG tablet Take 1 tablet (10 mg total) by mouth daily. For Blood Pressure 30 tablet 5   aspirin EC 81 MG tablet Take 1 tablet (81 mg total) by mouth daily. Swallow whole. 90 tablet 3   chlorthalidone (HYGROTON) 25 MG tablet Take 0.5 tablets (12.5 mg total) by mouth daily. 15 tablet 3   docusate sodium (COLACE) 100 MG capsule Take 100 mg by mouth daily as needed  for mild constipation.     DULoxetine (CYMBALTA) 30 MG capsule Take 30 mg by mouth daily.     ferrous sulfate 325 (65 FE) MG tablet 1 tablet with meal Orally daily for 30 day(s)     hydrocortisone (ANUSOL-HC) 2.5 % rectal cream Place 1 Application rectally 2 (two) times daily. 30 g 1   lidocaine (XYLOCAINE) 2 % solution place 15 mls ON back of THE TONGUE AND swallow EVERY 6 HOURS AS NEEDED FOR ABDOMINAL pain 300 mL 1   metoprolol succinate (TOPROL-XL) 100 MG 24 hr tablet TAKE 1 TABLET BY MOUTH DAILY with OR immediately following a meal 30 tablet 6   ondansetron (ZOFRAN) 4 MG tablet TAKE 1 TABLET BY MOUTH EVERY 8 HOURS AS NEEDED FOR NAUSEA AND VOMITING 30 tablet 0   pantoprazole (PROTONIX) 40 MG tablet Take 1 tablet (40 mg total) by mouth 2 (two) times daily before a meal. 60 tablet 3   polyethylene glycol (MIRALAX / GLYCOLAX) 17 g packet Take 17 g by mouth daily.     rosuvastatin (CRESTOR) 20 MG tablet Take 1 tablet (20 mg total) by mouth daily. 30 tablet 6   sacubitril-valsartan (ENTRESTO) 24-26 MG Take 1 tablet by mouth 2 (two) times daily. 60 tablet 6   spironolactone (ALDACTONE) 25 MG tablet Take 1 tablet (25 mg total) by mouth daily. 90 tablet 3   sucralfate (CARAFATE) 1 g tablet TAKE 1 TABLET BY MOUTH FOUR TIMES DAILY WITH MEALS AND AT BEDTIME 120 tablet 3   Vitamin D, Ergocalciferol, 50000 units CAPS Take 1 capsule by mouth once a week.     No current facility-administered medications for this visit.     Past Surgical History:  Procedure Laterality Date   BIOPSY  03/04/2019   Procedure: BIOPSY;  Surgeon: Corbin Ade, MD;  Location: [facility];  Service: Endoscopy;;  gastric   BIOPSY  12/01/2021   Procedure: BIOPSY;  Surgeon: Corbin Ade, MD;  Location: [facility];  Service: Endoscopy;;   COLONOSCOPY     COLONOSCOPY WITH PROPOFOL N/A 03/04/2019   Surgeon: Corbin Ade, MD;  Diverticulosis in the entire colon with a stenotic, stiff, chronically inflamed sigmoid  segment.  Low threshold for surgical consultation.  Grade 1 internal hemorrhoids. Repeat in 10 years.   COLONOSCOPY WITH PROPOFOL N/A 12/01/2021   Procedure: COLONOSCOPY WITH PROPOFOL;  Surgeon: Corbin Ade, MD;  Location: [facility];  Service: Endoscopy;  Laterality: N/A;  12:45 pm   ESOPHAGOGASTRODUODENOSCOPY (EGD) WITH PROPOFOL N/A 03/04/2019   Surgeon: Corbin Ade, MD;  gastric mucosal changes found to be chronic gastritis and focal intestinal metaplasia negative for H. 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  ESOPHAGOGASTRODUODENOSCOPY (EGD) WITH PROPOFOL N/A 12/01/2021   Procedure: ESOPHAGOGASTRODUODENOSCOPY (EGD) WITH PROPOFOL;  Surgeon: Corbin Ade, MD;  Location: [facility];  Service: Endoscopy;  Laterality: N/A;   LAPAROSCOPIC PARTIAL COLECTOMY N/A 07/14/2019   Procedure: LAPAROSCOPIC  LEFT HEMICOLECTOMY;  Surgeon: Lucretia Roers, MD;  Location: [facility];  Service: General;  Laterality: N/A;     Allergies  Allergen Reactions   Other     States he's allergic to a blood pressure pill that causes abdominal pain. He's not sure name of medication      Family History  Problem Relation Age of Onset   Lung cancer Father    Colon cancer Neg Hx    Colon polyps Neg Hx      Social History Mr. Bechel reports that he has been smoking cigarettes. He has a 6.3 pack-year smoking history. He has never used smokeless tobacco. Mr. Fieger reports no history of alcohol use.   Review of Systems CONSTITUTIONAL: No weight loss, fever, chills, weakness or fatigue.  HEENT: Eyes: No visual loss, blurred vision, double vision or yellow sclerae.No hearing loss, sneezing, congestion, runny nose or sore throat.  SKIN: No rash or itching.  CARDIOVASCULAR: per hpi RESPIRATORY: per hpi GASTROINTESTINAL: No anorexia, nausea, vomiting or diarrhea. No abdominal pain or blood.  GENITOURINARY: No burning on urination, no  polyuria NEUROLOGICAL: No headache, dizziness, syncope, paralysis, ataxia, numbness or tingling in the extremities. No change in bowel or bladder control.  MUSCULOSKELETAL: No muscle, back pain, joint pain or stiffness.  LYMPHATICS: No enlarged nodes. No history of splenectomy.  PSYCHIATRIC: No history of depression or anxiety.  ENDOCRINOLOGIC: No reports of sweating, cold or heat intolerance. No polyuria or polydipsia.  Marland Kitchen   Physical Examination Today's Vitals   12/25/22 0840  BP: (!) 138/96  Pulse: 85  SpO2: 99%  Weight: 178 lb 9.6 oz (81 kg)  Height: 6' (1.829 m)   Body mass index is 24.22 kg/m.  Gen: resting comfortably, no acute distress HEENT: no scleral icterus, pupils equal round and reactive, no palptable cervical adenopathy,  CV: RRR, no m/rg, no jvd Resp: Clear to auscultation bilaterally GI: abdomen is soft, non-tender, non-distended, normal bowel sounds, no hepatosplenomegaly MSK: extremities are warm, no edema.  Skin: warm, no rash Neuro:  no focal deficits Psych: appropriate affect   Diagnostic Studies  Jan 2024 echo IMPRESSIONS     1. Left ventricular ejection fraction, by estimation, is 40 to 45%. The  left ventricle has mildly decreased function. The left ventricle  demonstrates regional wall motion abnormalities (see scoring  diagram/findings for description). The left ventricular   internal cavity size was mildly to moderately dilated. There is mild left  ventricular hypertrophy. Left ventricular diastolic parameters are  consistent with Grade I diastolic dysfunction (impaired relaxation). The  average left ventricular global  longitudinal strain is -10.7 %. The global longitudinal strain is  abnormal.   2. Right ventricular systolic function is mildly reduced. The right  ventricular size is normal. Tricuspid regurgitation signal is inadequate  for assessing PA pressure.   3. Left atrial size was mildly dilated.   4. The mitral valve is grossly  normal. No evidence of mitral valve  regurgitation. No evidence of mitral stenosis.   5. The aortic valve is tricuspid. Aortic valve regurgitation is not  visualized. No aortic stenosis is present.   6. Aortic dilatation noted. There is mild dilatation of the aortic root,  measuring 40 mm.   7. The inferior vena  cava is dilated in size with >50% respiratory  variability, suggesting right atrial pressure of 8 mmHg.        Assessment and Plan  1.Chest pain - insurance company has consistently denies stress testing despite his symptoms - since last visit some decrease in frequency, severity but not resolved - we have discussed cath given prior echo findings and symptoms. With recent hemorroidal bleeding and upcoming banding as well as prostate biopsy would hold at this time.  - conitnue current meds  2. Chronic HFrEF - continue current meds, he is euvolemic today - not on SGLT2i, reported caused constipation   3. Dizziness - recent orthostatic dizzines - orthostatic vitals today are normal however - try lowering aldactone and following symptoms   4. HLD - upcoming labs with pcp  5.Preoperative evaluation - considering prostate biopsy - low risk neccesary procedure, recommend proceeding from cardiac standpoint. Can hold aspirin if needed.   6. PSVT - continue current meds, overall controlled - EKG today shows NSR  F/u 3 months      Antoine Poche, M.D.

## 2023-02-13 ENCOUNTER — Encounter: Payer: Self-pay | Admitting: Gastroenterology

## 2023-02-13 ENCOUNTER — Encounter: Payer: MEDICAID | Admitting: Gastroenterology

## 2023-02-13 NOTE — Progress Notes (Signed)
 Pt did not show for appt

## 2023-02-14 ENCOUNTER — Emergency Department (HOSPITAL_COMMUNITY): Payer: MEDICAID

## 2023-02-14 ENCOUNTER — Other Ambulatory Visit: Payer: Self-pay

## 2023-02-14 ENCOUNTER — Encounter (HOSPITAL_COMMUNITY): Payer: Self-pay

## 2023-02-14 ENCOUNTER — Emergency Department (HOSPITAL_COMMUNITY)
Admission: EM | Admit: 2023-02-14 | Discharge: 2023-02-14 | Disposition: A | Discharge status: Home / Self Care | Payer: MEDICAID | Attending: Student | Admitting: Student

## 2023-02-14 DIAGNOSIS — Z79899 Other long term (current) drug therapy: Secondary | ICD-10-CM | POA: Diagnosis not present

## 2023-02-14 DIAGNOSIS — R519 Headache, unspecified: Secondary | ICD-10-CM | POA: Insufficient documentation

## 2023-02-14 DIAGNOSIS — I2510 Atherosclerotic heart disease of native coronary artery without angina pectoris: Secondary | ICD-10-CM | POA: Insufficient documentation

## 2023-02-14 DIAGNOSIS — I110 Hypertensive heart disease with heart failure: Secondary | ICD-10-CM | POA: Diagnosis not present

## 2023-02-14 DIAGNOSIS — R7989 Other specified abnormal findings of blood chemistry: Secondary | ICD-10-CM | POA: Insufficient documentation

## 2023-02-14 DIAGNOSIS — Z7982 Long term (current) use of aspirin: Secondary | ICD-10-CM | POA: Diagnosis not present

## 2023-02-14 DIAGNOSIS — R0602 Shortness of breath: Secondary | ICD-10-CM | POA: Diagnosis present

## 2023-02-14 DIAGNOSIS — Z20822 Contact with and (suspected) exposure to covid-19: Secondary | ICD-10-CM | POA: Diagnosis not present

## 2023-02-14 DIAGNOSIS — I5022 Chronic systolic (congestive) heart failure: Secondary | ICD-10-CM | POA: Insufficient documentation

## 2023-02-14 DIAGNOSIS — F17210 Nicotine dependence, cigarettes, uncomplicated: Secondary | ICD-10-CM | POA: Diagnosis not present

## 2023-02-14 DIAGNOSIS — R0600 Dyspnea, unspecified: Secondary | ICD-10-CM | POA: Insufficient documentation

## 2023-02-14 LAB — RESP PANEL BY RT-PCR (RSV, FLU A&B, COVID)  RVPGX2
Influenza A by PCR: NEGATIVE
Influenza B by PCR: NEGATIVE
Resp Syncytial Virus by PCR: NEGATIVE
SARS Coronavirus 2 by RT PCR: NEGATIVE

## 2023-02-14 LAB — CBC
HCT: 50.4 % (ref 39.0–52.0)
Hemoglobin: 16.5 g/dL (ref 13.0–17.0)
MCH: 28.6 pg (ref 26.0–34.0)
MCHC: 32.7 g/dL (ref 30.0–36.0)
MCV: 87.3 fL (ref 80.0–100.0)
Platelets: 311 10*3/uL (ref 150–400)
RBC: 5.77 MIL/uL (ref 4.22–5.81)
RDW: 13.3 % (ref 11.5–15.5)
WBC: 6.3 10*3/uL (ref 4.0–10.5)
nRBC: 0 % (ref 0.0–0.2)

## 2023-02-14 LAB — BASIC METABOLIC PANEL
Anion gap: 13 (ref 5–15)
BUN: 27 mg/dL — ABNORMAL HIGH (ref 6–20)
CO2: 22 mmol/L (ref 22–32)
Calcium: 11 mg/dL — ABNORMAL HIGH (ref 8.9–10.3)
Chloride: 99 mmol/L (ref 98–111)
Creatinine, Ser: 1.71 mg/dL — ABNORMAL HIGH (ref 0.61–1.24)
GFR, Estimated: 48 mL/min — ABNORMAL LOW (ref >60)
Glucose, Bld: 104 mg/dL — ABNORMAL HIGH (ref 70–99)
Potassium: 3.8 mmol/L (ref 3.5–5.1)
Sodium: 134 mmol/L — ABNORMAL LOW (ref 135–145)

## 2023-02-14 LAB — HEPATIC FUNCTION PANEL
ALT: 12 U/L (ref 0–44)
AST: 19 U/L (ref 15–41)
Albumin: 4.2 g/dL (ref 3.5–5.0)
Alkaline Phosphatase: 58 U/L (ref 38–126)
Bilirubin, Direct: 0.1 mg/dL (ref 0.0–0.2)
Indirect Bilirubin: 0.7 mg/dL (ref 0.3–0.9)
Total Bilirubin: 0.8 mg/dL (ref 0.0–1.2)
Total Protein: 8.1 g/dL (ref 6.5–8.1)

## 2023-02-14 LAB — BRAIN NATRIURETIC PEPTIDE: B Natriuretic Peptide: 80 pg/mL (ref 0.0–100.0)

## 2023-02-14 LAB — TROPONIN I (HIGH SENSITIVITY)
Troponin I (High Sensitivity): 33 ng/L — ABNORMAL HIGH (ref <18)
Troponin I (High Sensitivity): 26 ng/L — ABNORMAL HIGH (ref <18)

## 2023-02-14 MED ORDER — DIPHENHYDRAMINE HCL 50 MG/ML IJ SOLN
25.0000 mg | Freq: Once | INTRAMUSCULAR | Status: AC
  Administered 2023-02-14: 25 mg via INTRAVENOUS
  Filled 2023-02-14: qty 1

## 2023-02-14 MED ORDER — ONDANSETRON 4 MG PO TBDP: 4.0000 mg | ORAL_TABLET | Freq: Once | ORAL | Status: DC

## 2023-02-14 MED ORDER — KETOROLAC TROMETHAMINE 15 MG/ML IJ SOLN
15.0000 mg | Freq: Once | INTRAMUSCULAR | Status: AC
  Administered 2023-02-14: 15 mg via INTRAVENOUS
  Filled 2023-02-14: qty 1

## 2023-02-14 MED ORDER — PROCHLORPERAZINE EDISYLATE 10 MG/2ML IJ SOLN
10.0000 mg | Freq: Once | INTRAMUSCULAR | Status: AC
  Administered 2023-02-14: 10 mg via INTRAVENOUS
  Filled 2023-02-14: qty 2

## 2023-02-14 NOTE — ED Triage Notes (Signed)
 Pt c/o chest pain, SOB, nausea, vomiting, ABD pain, headache. Pt went to [facility] 2 days ago, symptoms started 5 days ago. Pt states symptoms are getting worse.

## 2023-02-14 NOTE — ED Provider Notes (Signed)
 [place] EMERGENCY DEPARTMENT AT [facility] Provider Note  CSN: [id number] Arrival date & time: 02/14/23 1058  Chief Complaint(s) Shortness of Breath and Chest Pain  HPI Anthony Vaughn is a 53 y.o. male with PMH CAD status post MI, CHF, diverticulitis status post partial colectomy, GERD, HTN, peptic ulcer disease, panic attacks who presents Emergency Department for evaluation of multiple complaints including chest pain, shortness of breath, nausea, headache.  Patient evaluated at [facility] 2 days ago with normal workup.  He states that he is feeling short of breath and is having intractable hiccups.  Denies fever, vomiting, diarrhea or other symptoms.   Past Medical History Past Medical History:  Diagnosis Date   Arthritis    CAD (coronary artery disease)    Chronic systolic heart failure (HCC)    Diverticulitis    s/p partial colectomy due to stenotic sigmoind colon   Dyspnea    Dysrhythmia    GERD (gastroesophageal reflux disease)    HTN (hypertension)    Myocardial infarction (HCC) 2021   Panic attacks    SVT (supraventricular tachycardia) (HCC)    Patient Active Problem List   Diagnosis Date Noted   Prolapsed internal hemorrhoids, grade 2 06/08/2022   PUD (peptic ulcer disease) 05/04/2022   Hemorrhoids 05/04/2022   Anemia 09/28/2021   Dysphagia 09/28/2021   Rectal bleeding 04/21/2021   Constipation 04/20/2020   Hematochezia 04/20/2020   GERD (gastroesophageal reflux disease) 10/21/2019   Chronic HFrEF (heart failure with reduced ejection fraction) -combined diastolic and systolic dysfunction CHF-EF 40 to 45 % 07/15/2019   Nicotine  abuse 07/15/2019   S/P partial colectomy-for stricture secondary to recurrent diverticulitis 07/15/2019   Hypertension 07/14/2019   Accelerated hypertension 07/14/2019   Sigmoid stricture (HCC) 06/10/2019   Diverticulitis of colon 05/01/2019   Nausea without vomiting 02/04/2019   History of colonic diverticulitis  02/04/2019   Epigastric burning sensation 10/21/2018   Pain of upper abdomen 10/21/2018   Home Medication(s) Prior to Admission medications   Medication Sig Start Date End Date Taking? Authorizing Provider  amLODipine  (NORVASC ) 10 MG tablet Take 1 tablet (10 mg total) by mouth daily. For Blood Pressure 07/17/19  Yes Emokpae, Courage, MD  aspirin  EC 81 MG tablet Take 1 tablet (81 mg total) by mouth daily. Swallow whole. 10/18/20  Yes Richarda Prentice LITTIE Mickey., NP  chlorthalidone  (HYGROTON ) 25 MG tablet Take 0.5 tablets (12.5 mg total) by mouth daily. 03/08/22  Yes BranchDorn FALCON, MD  docusate sodium  (COLACE) 100 MG capsule Take 100 mg by mouth daily as needed for mild constipation.   Yes [provider]  DULoxetine (CYMBALTA) 60 MG capsule Take 60 mg by mouth daily.   Yes [provider]  famotidine (PEPCID) 20 MG tablet Take 20 mg by mouth 2 (two) times daily. 02/12/23 02/27/23 Yes [provider]  ferrous sulfate 325 (65 FE) MG tablet 1 tablet with meal Orally daily for 30 day(s) 09/15/21  Yes [provider]  hydrocortisone  (ANUSOL -HC) 2.5 % rectal cream Place 1 Application rectally 2 (two) times daily. 08/16/22  Yes Rudy Josette RAMAN, PA-C  lidocaine  (XYLOCAINE ) 2 % solution place 15 mls ON back of THE TONGUE AND swallow EVERY 6 HOURS AS NEEDED FOR ABDOMINAL pain 09/11/22  Yes Rudy Josette S, PA-C  metoprolol  succinate (TOPROL -XL) 100 MG 24 hr tablet TAKE 1 TABLET BY MOUTH DAILY with OR immediately following a meal 12/12/22  Yes Branch, Dorn FALCON, MD  ondansetron  (ZOFRAN ) 8 MG tablet Take 8 mg by  mouth every 8 (eight) hours as needed for vomiting or nausea.   Yes [provider]  pantoprazole  (PROTONIX ) 40 MG tablet Take 1 tablet (40 mg total) by mouth 2 (two) times daily before a meal. Patient taking differently: Take 40 mg by mouth daily as needed. 09/28/21 02/14/23 Yes Harper, Josette RAMAN, PA-C  polyethylene glycol (MIRALAX / GLYCOLAX) 17 g packet Take 17 g by  mouth daily.   Yes [provider]  promethazine  (PHENERGAN ) 25 MG tablet Take 25 mg by mouth every 6 (six) hours as needed for nausea or vomiting. 02/12/23 02/19/23 Yes [provider]  QUEtiapine (SEROQUEL) 25 MG tablet Take 25 mg by mouth at bedtime. 01/19/23  Yes [provider]  rosuvastatin  (CRESTOR ) 20 MG tablet Take 1 tablet (20 mg total) by mouth daily. 01/05/22  Yes BranchDorn FALCON, MD  sacubitril -valsartan  (ENTRESTO ) 24-26 MG Take 1 tablet by mouth 2 (two) times daily. 01/05/22  Yes BranchDorn FALCON, MD  spironolactone  (ALDACTONE ) 25 MG tablet Take 0.5 tablets (12.5 mg total) by mouth daily. 12/25/22  Yes BranchDorn FALCON, MD  sucralfate  (CARAFATE ) 1 g tablet TAKE 1 TABLET BY MOUTH FOUR TIMES DAILY WITH MEALS AND AT BEDTIME 11/02/22  Yes Rudy Josette RAMAN, PA-C  Vitamin D, Ergocalciferol, 50000 units CAPS Take 1 capsule by mouth once a week.   Yes [provider]                                                                                                                                    Past Surgical History Past Surgical History:  Procedure Laterality Date   BIOPSY  03/04/2019   Procedure: BIOPSY;  Surgeon: Shaaron Lamar HERO, MD;  Location: [facility];  Service: Endoscopy;;  gastric   BIOPSY  12/01/2021   Procedure: BIOPSY;  Surgeon: Shaaron Lamar HERO, MD;  Location: [facility];  Service: Endoscopy;;   COLONOSCOPY     COLONOSCOPY WITH PROPOFOL  N/A 03/04/2019   Surgeon: Shaaron Lamar HERO, MD;  Diverticulosis in the entire colon with a stenotic, stiff, chronically inflamed sigmoid segment.  Low threshold for surgical consultation.  Grade 1 internal hemorrhoids. Repeat in 10 years.   COLONOSCOPY WITH PROPOFOL  N/A 12/01/2021   Procedure: COLONOSCOPY WITH PROPOFOL ;  Surgeon: Shaaron Lamar HERO, MD;  Location: [facility];  Service: Endoscopy;  Laterality: N/A;  12:45 pm   ESOPHAGOGASTRODUODENOSCOPY (EGD) WITH PROPOFOL  N/A 03/04/2019   Surgeon:  Shaaron Lamar HERO, MD;  gastric mucosal changes found to be chronic gastritis and focal intestinal metaplasia negative for H. 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   ESOPHAGOGASTRODUODENOSCOPY (EGD) WITH PROPOFOL  N/A 12/01/2021   Procedure: ESOPHAGOGASTRODUODENOSCOPY (EGD) WITH PROPOFOL ;  Surgeon: Shaaron Lamar HERO, MD;  Location: [facility];  Service: Endoscopy;  Laterality: N/A;   LAPAROSCOPIC PARTIAL COLECTOMY N/A 07/14/2019   Procedure: LAPAROSCOPIC  LEFT HEMICOLECTOMY;  Surgeon:  Kallie Manuelita BROCKS, MD;  Location: [facility] ORS;  Service: General;  Laterality: N/A;   Family History Family History  Problem Relation Age of Onset   Lung cancer Father    Colon cancer Neg Hx    Colon polyps Neg Hx     Social History Social History   Tobacco Use   Smoking status: Every Day    Current packs/day: 0.25    Average packs/day: 0.3 packs/day for 25.0 years (6.3 ttl pk-yrs)    Types: Cigarettes   Smokeless tobacco: Never  Vaping Use   Vaping status: Never Used  Substance Use Topics   Alcohol use: No   Drug use: No   Allergies Other  Review of Systems Review of Systems  Respiratory:  Positive for cough.   Cardiovascular:  Positive for chest pain.  Neurological:  Positive for headaches.    Physical Exam Vital Signs  I have reviewed the triage vital signs BP (!) 159/123 (BP Location: Left Arm)   Pulse 86   Temp 98.3 F (36.8 C) (Oral)   Resp 16   Ht 6' (1.829 m)   Wt 72.6 kg   SpO2 100%   BMI 21.70 kg/m   Physical Exam Constitutional:      General: He is not in acute distress.    Appearance: Normal appearance.  HENT:     Head: Normocephalic and atraumatic.     Nose: No congestion or rhinorrhea.  Eyes:     General:        Right eye: No discharge.        Left eye: No discharge.     Extraocular Movements: Extraocular movements intact.     Pupils: Pupils are equal, round, and reactive to light.  Cardiovascular:      Rate and Rhythm: Normal rate and regular rhythm.     Heart sounds: No murmur heard. Pulmonary:     Effort: No respiratory distress.     Breath sounds: No wheezing or rales.  Abdominal:     General: There is no distension.     Tenderness: There is no abdominal tenderness.  Musculoskeletal:        General: Normal range of motion.     Cervical back: Normal range of motion.  Skin:    General: Skin is warm and dry.  Neurological:     General: No focal deficit present.     Mental Status: He is alert.     ED Results and Treatments Labs (all labs ordered are listed, but only abnormal results are displayed) Labs Reviewed  BASIC METABOLIC PANEL - Abnormal; Notable for the following components:      Result Value   Sodium 134 (*)    Glucose, Bld 104 (*)    BUN 27 (*)    Creatinine, Ser 1.71 (*)    Calcium  11.0 (*)    GFR, Estimated 48 (*)    All other components within normal limits  TROPONIN I (HIGH SENSITIVITY) - Abnormal; Notable for the following components:   Troponin I (High Sensitivity) 33 (*)    All other components within normal limits  TROPONIN I (HIGH SENSITIVITY) - Abnormal; Notable for the following components:   Troponin I (High Sensitivity) 26 (*)    All other components within normal limits  RESP PANEL BY RT-PCR (RSV, FLU A&B, COVID)  RVPGX2  CBC  BRAIN NATRIURETIC PEPTIDE  HEPATIC FUNCTION PANEL  Radiology DG Chest 2 View Result Date: 02/14/2023 CLINICAL DATA:  Chest pain, shortness of breath, nausea, and vomiting. EXAM: CHEST - 2 VIEW COMPARISON:  02/12/2023 FINDINGS: The heart size and mediastinal contours are within normal limits. Both lungs are clear. The visualized skeletal structures are unremarkable. IMPRESSION: No active cardiopulmonary disease. Electronically Signed   By: Norleen DELENA Kil M.D.   On: 02/14/2023 12:14    Pertinent labs &  imaging results that were available during my care of the patient were reviewed by me and considered in my medical decision making (see MDM for details).  Medications Ordered in ED Medications  prochlorperazine  (COMPAZINE ) injection 10 mg (10 mg Intravenous Given 02/14/23 1317)  diphenhydrAMINE  (BENADRYL ) injection 25 mg (25 mg Intravenous Given 02/14/23 1316)  ketorolac  (TORADOL ) 15 MG/ML injection 15 mg (15 mg Intravenous Given 02/14/23 1317)                                                                                                                                     Procedures Procedures  (including critical care time)  Medical Decision Making / ED Course   This patient presents to the ED for concern of chest pain, shortness of breath, headache, this involves an extensive number of treatment options, and is a complaint that carries with it a high risk of complications and morbidity.  The differential diagnosis includes unspecified viral URI, COVID-19, influenza, RSV, ACS, Aortic Dissection, Pneumothorax, Pneumonia, Esophageal Rupture, PE, Tamponade/Pericardial Effusion, pericarditis, esophageal spasm, dysrhythmia, GERD, costochondritis.  MDM: Seen emergency room for evaluation of multiple complaints as described above.  Physical exam largely unremarkable with no wheezing or rales heard on lung exam.  No accessory muscle use or obvious tachypnea.  Laboratory evaluation with a mild creatinine elevation to 1.71, BUN 27, calcium  11, initial high-sensitivity troponin.  ECG with evidence of LVH with PVCs but is otherwise nonischemic.  Chest x-ray unremarkable.  Patient given a headache cocktail and on reevaluation his symptoms have significantly improved.  His shortness of breath has resolved and his hiccups have improved.  His chest pain has also resolved.  Low risk by Wells criteria and I have low suspicion for PE.  Low suspicion for ACS given downtrending troponins.  Heart score is moderate but  patient presentation is largely inconsistent with ACS given almost complete resolution of patient's chest pain with headache cocktail and no exertional component to the patient's pain.  We have shared decision making and patient will follow-up with his outpatient cardiologist.  Return precautions given of which she voiced understanding but at this time he will be discharged with outpatient cardiology follow-up.   Additional history obtained:  -External records from outside source obtained and reviewed including: Chart review including previous notes, labs, imaging, consultation notes   Lab Tests: -I ordered, reviewed, and interpreted labs.   The pertinent results include:   Labs Reviewed  BASIC METABOLIC PANEL - Abnormal; Notable for the  following components:      Result Value   Sodium 134 (*)    Glucose, Bld 104 (*)    BUN 27 (*)    Creatinine, Ser 1.71 (*)    Calcium  11.0 (*)    GFR, Estimated 48 (*)    All other components within normal limits  TROPONIN I (HIGH SENSITIVITY) - Abnormal; Notable for the following components:   Troponin I (High Sensitivity) 33 (*)    All other components within normal limits  TROPONIN I (HIGH SENSITIVITY) - Abnormal; Notable for the following components:   Troponin I (High Sensitivity) 26 (*)    All other components within normal limits  RESP PANEL BY RT-PCR (RSV, FLU A&B, COVID)  RVPGX2  CBC  BRAIN NATRIURETIC PEPTIDE  HEPATIC FUNCTION PANEL      EKG   EKG Interpretation Date/Time:  Wednesday February 14 2023 11:12:20 EST Ventricular Rate:  104 PR Interval:  138 QRS Duration:  94 QT Interval:  352 QTC Calculation: 462 R Axis:   87  Text Interpretation: Sinus tachycardia with frequent Premature ventricular complexes Biatrial enlargement Left ventricular hypertrophy with repolarization abnormality ( Sokolow-Lyon , Cornell product , Romhilt-Estes ) Abnormal ECG When compared with ECG of 25-Dec-2022 08:44, Premature ventricular complexes are  now Present Non-specific change in ST segment in Anterior leads Nonspecific T wave abnormality now evident in Inferior leads T wave inversion less evident in Lateral leads Confirmed by Josey Dettmann ([id number]) on 02/14/2023 5:27:16 PM         Imaging Studies ordered: I ordered imaging studies including chest x-ray I independently visualized and interpreted imaging. I agree with the radiologist interpretation   Medicines ordered and prescription drug management: Meds ordered this encounter  Medications   DISCONTD: ondansetron  (ZOFRAN -ODT) disintegrating tablet 4 mg   prochlorperazine  (COMPAZINE ) injection 10 mg   diphenhydrAMINE  (BENADRYL ) injection 25 mg   ketorolac  (TORADOL ) 15 MG/ML injection 15 mg    -I have reviewed the patients home medicines and have made adjustments as needed  Critical interventions none    Cardiac Monitoring: The patient was maintained on a cardiac monitor.  I personally viewed and interpreted the cardiac monitored which showed an underlying rhythm of: Sinus tachycardia, NSR  Social Determinants of Health:  Factors impacting patients care include: none   Reevaluation: After the interventions noted above, I reevaluated the patient and found that they have :improved  Co morbidities that complicate the patient evaluation  Past Medical History:  Diagnosis Date   Arthritis    CAD (coronary artery disease)    Chronic systolic heart failure (HCC)    Diverticulitis    s/p partial colectomy due to stenotic sigmoind colon   Dyspnea    Dysrhythmia    GERD (gastroesophageal reflux disease)    HTN (hypertension)    Myocardial infarction (HCC) 2021   Panic attacks    SVT (supraventricular tachycardia) (HCC)       Dispostion: I considered admission for this patient, but at this time with downtrending troponins, patient does not meet inpatient criteria for admission and will need to follow-up outpatient with his cardiologist.  With symptoms improved he  was given return precautions of which she voiced understanding he was discharged     Final Clinical Impression(s) / ED Diagnoses Final diagnoses:  Acute nonintractable headache, unspecified headache type  Dyspnea, unspecified type     @PCDICTATION @    Albertina Dixon, MD 02/14/23 1731

## 2023-02-14 NOTE — ED Notes (Signed)
 Patient has been able to keep down a bottle of gatorade without nausea or vomiting. Patient reports to be feeling able to handle liquids and possibly solids since receiving the medication.

## 2023-02-20 ENCOUNTER — Ambulatory Visit (HOSPITAL_COMMUNITY): Payer: MEDICAID

## 2023-02-20 ENCOUNTER — Ambulatory Visit: Payer: MEDICAID | Admitting: Urology

## 2023-02-20 DIAGNOSIS — R972 Elevated prostate specific antigen [PSA]: Secondary | ICD-10-CM

## 2023-02-22 ENCOUNTER — Telehealth: Payer: Self-pay

## 2023-02-22 NOTE — Telephone Encounter (Signed)
Patient called to reschedule his prostate biopsy can you please reach out and confirm a new date with him.

## 2023-02-23 ENCOUNTER — Other Ambulatory Visit: Payer: Self-pay

## 2023-02-23 DIAGNOSIS — R972 Elevated prostate specific antigen [PSA]: Secondary | ICD-10-CM

## 2023-02-23 NOTE — Telephone Encounter (Signed)
Pt called to set appt for biopsy confirmed date of 03/06/2023

## 2023-02-27 ENCOUNTER — Other Ambulatory Visit: Payer: MEDICAID | Admitting: Urology

## 2023-02-27 NOTE — Progress Notes (Unsigned)
Referring Provider: [facility] Primary Care Physician:  [facility] Primary GI Physician: Dr. Jena Gauss  No chief complaint on file.   HPI:   Anthony Vaughn is a 53 y.o. 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 pylori. Also with history of rectal bleeding related to hemorrhoids noted on colonoscopy in October 2023.   Colonoscopy 12/01/2021: Internal hemorrhoids, evidence of prior surgical resection with anastomosis at 25 cm, otherwise normal exam.  Suspected hemorrhoidal bleeding.  Recommended repeat in 10 years.   EGD 12/07/2021 erosive reflux esophagitis, small hiatal hernia, gastric erosions, prepyloric gastric ulcer biopsied, patent pylorus.  3 distinct areas of stricture involving the posterior bulb and second portion of the duodenum with associated ulceration requiring sequential balloon dilation.  Pediatric colonoscope was able to pass through all of these areas into the distal second portion.  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  Recommended PPI twice daily, avoid NSAIDs, anticipate repeat EGD in about 3 months using pediatric colonoscope.  Pathology revealed chronic active gastritis, negative for H. pylori.  Patient was scheduled for EGD on 03/03/2022, but this was canceled as anesthesiologist requested patient have follow-up with cardiology for scheduling.   Echocardiogram completed 03/06/2022 with EF 40-45%. Myoview was ordered but never completed.      Last seen in our office 05/04/2022.  He continued with daily upper abdominal pain, burning, intermittent nausea without vomiting suspected to be secondary to known PUD and duodenal strictures.  Also with intermittent solid food dysphagia.  Reported compliance with PPI twice daily and Carafate and had discontinued Goody powders after his EGD.  Continue taking 81 mg aspirin  daily.  Needed repeat EGD, but cardiac evaluation this still has not been completed for evaluation of chest pain and shortness of breath.  Recommended reaching out to cardiology ASAP to arrange NM stress test as previously ordered and hope to proceed with an EGD in the near future.  He also reported intermittent rectal bleeding about once a month and constipation that was not adequately managed with daily MiraLAX.  Recommended Linzess 145 mcg daily and hemorrhoid banding.   He underwent hemorrhoid banding x1 on 06/08/2022.  Patient has no showed or canceled several follow-up appointments with Lewie Loron, NP for repeat hemorrhoid banding.  Patient saw cardiology 10/04/2022.  Noted progressive chest pain and dyspnea on exertion concerning for angina.  Recommended cardiac catheterization, but patient was not in favor and preferred to try a stress test.  Stress test was reordered but was again declined by insurance.   Today:       Past Medical History:  Diagnosis Date   Arthritis    CAD (coronary artery disease)    Chronic systolic heart failure (HCC)    Diverticulitis    s/p partial colectomy due to stenotic sigmoind colon   Dyspnea    Dysrhythmia    GERD (gastroesophageal reflux disease)    HTN (hypertension)    Myocardial infarction (HCC) 2021   Panic attacks    SVT (supraventricular tachycardia) (HCC)     Past Surgical History:  Procedure Laterality Date   BIOPSY  03/04/2019   Procedure: BIOPSY;  Surgeon: Corbin Ade, MD;  Location: [facility];  Service: Endoscopy;;  gastric   BIOPSY  12/01/2021   Procedure: BIOPSY;  Surgeon: Corbin Ade, MD;  Location: [facility];  Service: Endoscopy;;   COLONOSCOPY  COLONOSCOPY WITH PROPOFOL N/A 03/04/2019   Surgeon: Corbin Ade, MD;  Diverticulosis in the entire colon with a stenotic, stiff, chronically inflamed sigmoid segment.  Low threshold for surgical consultation.  Grade 1 internal hemorrhoids. Repeat in 10 years.    COLONOSCOPY WITH PROPOFOL N/A 12/01/2021   Procedure: COLONOSCOPY WITH PROPOFOL;  Surgeon: Corbin Ade, MD;  Location: [facility];  Service: Endoscopy;  Laterality: N/A;  12:45 pm   ESOPHAGOGASTRODUODENOSCOPY (EGD) WITH PROPOFOL N/A 03/04/2019   Surgeon: Corbin Ade, MD;  gastric mucosal changes found to be chronic gastritis and focal intestinal metaplasia negative for H. 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   ESOPHAGOGASTRODUODENOSCOPY (EGD) WITH PROPOFOL N/A 12/01/2021   Procedure: ESOPHAGOGASTRODUODENOSCOPY (EGD) WITH PROPOFOL;  Surgeon: Corbin Ade, MD;  Location: [facility];  Service: Endoscopy;  Laterality: N/A;   LAPAROSCOPIC PARTIAL COLECTOMY N/A 07/14/2019   Procedure: LAPAROSCOPIC  LEFT HEMICOLECTOMY;  Surgeon: Lucretia Roers, MD;  Location: [facility];  Service: General;  Laterality: N/A;    Current Outpatient Medications  Medication Sig Dispense Refill   amLODipine (NORVASC) 10 MG tablet Take 1 tablet (10 mg total) by mouth daily. For Blood Pressure 30 tablet 5   aspirin EC 81 MG tablet Take 1 tablet (81 mg total) by mouth daily. Swallow whole. 90 tablet 3   chlorthalidone (HYGROTON) 25 MG tablet Take 0.5 tablets (12.5 mg total) by mouth daily. 15 tablet 3   docusate sodium (COLACE) 100 MG capsule Take 100 mg by mouth daily as needed for mild constipation.     DULoxetine (CYMBALTA) 60 MG capsule Take 60 mg by mouth daily.     famotidine (PEPCID) 20 MG tablet Take 20 mg by mouth 2 (two) times daily.     ferrous sulfate 325 (65 FE) MG tablet 1 tablet with meal Orally daily for 30 day(s)     hydrocortisone (ANUSOL-HC) 2.5 % rectal cream Place 1 Application rectally 2 (two) times daily. 30 g 1   lidocaine (XYLOCAINE) 2 % solution place 15 mls ON back of THE TONGUE AND swallow EVERY 6 HOURS AS NEEDED FOR ABDOMINAL pain 300 mL 1   metoprolol succinate (TOPROL-XL) 100 MG 24 hr tablet TAKE 1 TABLET BY MOUTH  DAILY with OR immediately following a meal 30 tablet 6   ondansetron (ZOFRAN) 8 MG tablet Take 8 mg by mouth every 8 (eight) hours as needed for vomiting or nausea.     pantoprazole (PROTONIX) 40 MG tablet Take 1 tablet (40 mg total) by mouth 2 (two) times daily before a meal. (Patient taking differently: Take 40 mg by mouth daily as needed.) 60 tablet 3   polyethylene glycol (MIRALAX / GLYCOLAX) 17 g packet Take 17 g by mouth daily.     QUEtiapine (SEROQUEL) 25 MG tablet Take 25 mg by mouth at bedtime.     rosuvastatin (CRESTOR) 20 MG tablet Take 1 tablet (20 mg total) by mouth daily. 30 tablet 6   sacubitril-valsartan (ENTRESTO) 24-26 MG Take 1 tablet by mouth 2 (two) times daily. 60 tablet 6   spironolactone (ALDACTONE) 25 MG tablet Take 0.5 tablets (12.5 mg total) by mouth daily.     sucralfate (CARAFATE) 1 g tablet TAKE 1 TABLET BY MOUTH FOUR TIMES DAILY WITH MEALS AND AT BEDTIME 120 tablet 3   Vitamin D, Ergocalciferol, 50000 units CAPS Take 1 capsule by mouth once a week.     No current facility-administered medications for this  visit.    Allergies as of 02/28/2023 - Review Complete 02/14/2023  Allergen Reaction Noted   Other  02/04/2019    Family History  Problem Relation Age of Onset   Lung cancer Father    Colon cancer Neg Hx    Colon polyps Neg Hx     Social History   Socioeconomic History   Marital status: Single    Spouse name: Not on file   Number of children: Not on file   Years of education: Not on file   Highest education level: Not on file  Occupational History   Not on file  Tobacco Use   Smoking status: Every Day    Current packs/day: 0.25    Average packs/day: 0.3 packs/day for 25.0 years (6.3 ttl pk-yrs)    Types: Cigarettes   Smokeless tobacco: Never  Vaping Use   Vaping status: Never Used  Substance and Sexual Activity   Alcohol use: No   Drug use: No   Sexual activity: Yes  Other Topics Concern   Not on file  Social History Narrative   Not  on file   Social Drivers of Health   Financial Resource Strain: Not on file  Food Insecurity: Not on file  Transportation Needs: Not on file  Physical Activity: Not on file  Stress: Not on file  Social Connections: Not on file    Review of Systems: Gen: Denies fever, chills, anorexia. Denies fatigue, weakness, weight loss.  CV: Denies chest pain, palpitations, syncope, peripheral edema, and claudication. Resp: Denies dyspnea at rest, cough, wheezing, coughing up blood, and pleurisy. GI: Denies vomiting blood, jaundice, and fecal incontinence.   Denies dysphagia or odynophagia. Derm: Denies rash, itching, dry skin Psych: Denies depression, anxiety, memory loss, confusion. No homicidal or suicidal ideation.  Heme: Denies bruising, bleeding, and enlarged lymph nodes.  Physical Exam: There were no vitals taken for this visit. General:   Alert and oriented. No distress noted. Pleasant and cooperative.  Head:  Normocephalic and atraumatic. Eyes:  Conjuctiva clear without scleral icterus. Heart:  S1, S2 present without murmurs appreciated. Lungs:  Clear to auscultation bilaterally. No wheezes, rales, or rhonchi. No distress.  Abdomen:  +BS, soft, non-tender and non-distended. No rebound or guarding. No HSM or masses noted. Msk:  Symmetrical without gross deformities. Normal posture. Extremities:  Without edema. Neurologic:  Alert and  oriented x4 Psych:  Normal mood and affect.    Assessment:     Plan:  ***   Ermalinda Memos, PA-C [facility] 02/28/2023

## 2023-02-28 ENCOUNTER — Other Ambulatory Visit (HOSPITAL_COMMUNITY)
Admission: RE | Admit: 2023-02-28 | Discharge: 2023-02-28 | Disposition: A | Discharge status: Home / Self Care | Payer: MEDICAID | Source: Ambulatory Visit | Attending: Gastroenterology | Admitting: Gastroenterology

## 2023-02-28 ENCOUNTER — Encounter: Payer: Self-pay | Admitting: Gastroenterology

## 2023-02-28 ENCOUNTER — Ambulatory Visit (INDEPENDENT_AMBULATORY_CARE_PROVIDER_SITE_OTHER): Payer: MEDICAID | Admitting: Gastroenterology

## 2023-02-28 VITALS — BP 106/81 | HR 85 | Temp 97.8°F | Ht 72.0 in | Wt 163.4 lb

## 2023-02-28 DIAGNOSIS — R112 Nausea with vomiting, unspecified: Secondary | ICD-10-CM | POA: Diagnosis present

## 2023-02-28 DIAGNOSIS — R1010 Upper abdominal pain, unspecified: Secondary | ICD-10-CM | POA: Diagnosis not present

## 2023-02-28 DIAGNOSIS — K279 Peptic ulcer, site unspecified, unspecified as acute or chronic, without hemorrhage or perforation: Secondary | ICD-10-CM

## 2023-02-28 DIAGNOSIS — K5900 Constipation, unspecified: Secondary | ICD-10-CM

## 2023-02-28 DIAGNOSIS — K5909 Other constipation: Secondary | ICD-10-CM

## 2023-02-28 DIAGNOSIS — R634 Abnormal weight loss: Secondary | ICD-10-CM | POA: Diagnosis not present

## 2023-02-28 DIAGNOSIS — K219 Gastro-esophageal reflux disease without esophagitis: Secondary | ICD-10-CM

## 2023-02-28 DIAGNOSIS — K315 Obstruction of duodenum: Secondary | ICD-10-CM

## 2023-02-28 LAB — BASIC METABOLIC PANEL
Anion gap: 11 (ref 5–15)
BUN: 21 mg/dL — ABNORMAL HIGH (ref 6–20)
CO2: 28 mmol/L (ref 22–32)
Calcium: 10.7 mg/dL — ABNORMAL HIGH (ref 8.9–10.3)
Chloride: 98 mmol/L (ref 98–111)
Creatinine, Ser: 1.64 mg/dL — ABNORMAL HIGH (ref 0.61–1.24)
GFR, Estimated: 51 mL/min — ABNORMAL LOW (ref >60)
Glucose, Bld: 95 mg/dL (ref 70–99)
Potassium: 3.8 mmol/L (ref 3.5–5.1)
Sodium: 137 mmol/L (ref 135–145)

## 2023-02-28 MED ORDER — PANTOPRAZOLE SODIUM 40 MG PO TBEC: 40.0000 mg | DELAYED_RELEASE_TABLET | Freq: Two times a day (BID) | ORAL | 3 refills | Status: AC

## 2023-02-28 NOTE — Patient Instructions (Addendum)
Please have labs completed at [facility].   Resume pantoprazole 40 mg twice daily.   Continue Carafate 1g 4 times daily.   Follow a liquid diet today to see how you tolerate this.  I recommend adding 3 high protein (30g or more) protein shakes (boost, fair life, premier protein, etc) daily to help with nutritional intake.  If you tolerate this ok, you can try soft foods.  If you are not able to tolerate even a liquid diet, you should proceed to the emergency room.  We need to get your cardiac evaluation completed ASAP in order to proceed with an upper endoscopy.  I will reach out to Dr. Wyline Mood about this, but you should also reach out to them as well.  Resume taking Linzess 145 mcg daily for constipation as this may be contributing to your lower abdominal pain.  Call our office back when you are ready to reschedule a hemorrhoid banding.  Ermalinda Memos, PA-C [facility]

## 2023-03-01 ENCOUNTER — Encounter: Payer: Self-pay | Admitting: Gastroenterology

## 2023-03-06 ENCOUNTER — Other Ambulatory Visit: Payer: MEDICAID | Admitting: Urology

## 2023-03-06 DIAGNOSIS — R972 Elevated prostate specific antigen [PSA]: Secondary | ICD-10-CM

## 2023-03-12 ENCOUNTER — Other Ambulatory Visit: Payer: Self-pay | Admitting: Cardiology

## 2023-03-17 ENCOUNTER — Other Ambulatory Visit: Payer: Self-pay | Admitting: Cardiology

## 2023-03-26 NOTE — Progress Notes (Incomplete)
Risks, benefits, and some of the potential complications of a transrectal ultrasounds of the prostate (TRUSP) with biopsies were discussed at length with the patient including gross hematuria, blood in the bowel movements, hematospermia, bacteremia, infection, voiding discomfort, urinary retention, fever, chills, sepsis, blood transfusion, death, and others. All questions were answered. Informed consent was obtained. The patient confirmed that he had taken his pre-procedure antibiotic. All anticoagulants were discontinued prior to the procedure. The patient emptied his bladder. He was positioned in a comfortable left lateral decubitus position with hips and knees acutely flexed.  The rectal probe was inserted into the rectum without difficulty. 10cc of 2% Lidocaine without epinephrine was instilled with a spinal needle using ultrasound guidance near the junction of each seminal vesicle and the prostate.  Sequential transverse (axial) scans were made in small increments beginning at the seminal vesicles and ending at the prostatic apex. Sequential longitudinal (saggital) scans were made in small increments beginning at the right lateral prostate and ending at the left lateral prostate. Excellent anatomical imaging was obtained. The peripheral, transitional, and central zones were well-defined. The seminal vesicles were normal.  Prostate volume *** ml.  There were no hypoechoic areas. 12 needle core biopsies were performed. 1 biopsy each was taken from the following areas:  Right lateral base, right medial base, right lateral mid prostate, right medial mid prostate, right lateral apical prostate, right medial apical prostate, left lateral base, left medial base, left lateral mid prostate, left medial mid prostate, left lateral apical prostate, left medial apical prostate.. Minimal prostatic calcifications were noted. Excellent biopsy specimens were obtained.  Follow-up rectal examination was unremarkable.  The procedure was well-tolerated and without complications. Antibiotic instructions were given. The patient was told that:  For several days:  he should increase his fluid intake and limit strenuous activity  he might have mild discomfort at the base of his penis or in his rectum  he might have blood in his urine or blood in his bowel movements  For 2-3 months:  he might have blood in his ejaculate (semen)  Instructions were given to call the office immedicately for blood clots in the urine or bowel movements, difficulty urinating, inability to urinate, urinary retention, painful or frequent urination, fever, chills, nausea, vomiting, or other illness. The patient stated that he understood these instructions and would comply with them. We told the patient that prostate biopsy pathology reports are usually available within 3-5 working days, unless a pathologic second opinion is required, which may take 7-14 days. We told him to contact us to check on the status of his biopsy if he has not heard from us within 7 days. The patient left the ultrasound examination room in stable condition.    

## 2023-03-27 ENCOUNTER — Other Ambulatory Visit: Payer: Self-pay

## 2023-03-27 ENCOUNTER — Telehealth: Payer: Self-pay

## 2023-03-27 ENCOUNTER — Other Ambulatory Visit: Payer: MEDICAID | Admitting: Urology

## 2023-03-27 DIAGNOSIS — R972 Elevated prostate specific antigen [PSA]: Secondary | ICD-10-CM

## 2023-03-27 NOTE — Addendum Note (Signed)
 Addended by: Grier Rocher on: 03/27/2023 08:57 AM   Modules accepted: Orders

## 2023-03-27 NOTE — Telephone Encounter (Signed)
 Prostate biopsy was rescheduled with patient for 05/22/2023.  Biopsy instructions sent via mychart.  Orders updated with new biopsy date.

## 2023-04-02 ENCOUNTER — Ambulatory Visit: Payer: MEDICAID | Attending: Cardiology | Admitting: Cardiology

## 2023-04-02 NOTE — Progress Notes (Deleted)
 Clinical Summary Mr. Brumley is a 53 y.o.male  seen today for follow up of the following medical problems.        1. Presumed CAD - 08/2019 nuclear stress: large iferior infarct, no ischemia - 07/2019 echo inferior wall is akinetic.  - 08/2021 echo LVEF 40-45%, grade II dd. The inferior wall is akinetic. The basal inferolateral segment and apical inferior segment are hypokinetic    -at prior visit reported episode of chest pain wile  out mowing grass using push mower. Midchest, sharp 9/10 pain. Some nausea, some SOB. Stopped and rested inside house. Ongoing pain, lasted 20 minutes. Pain resolved on its own. Pain comes and goes since that time, typically with activity. Ongoing DOE that has progressed over the last few months     - unable to run on treadmill due SOB, chronic back pain, balance problems, dizziness   - still with chest pains at times. Episodes occur about once a week. - some decrease in frequency and severity since last visit - his insurance company denies a stress test 3 different times.     ER visit 02/14/23 with chest pain - trops neg, EKG no acute ischemic changes     2. Dizziness - was at home sitting watching TV. Stood up and felt very dizzy, lightheaded and fell - reports drinks at least 4-6 bottles of water daily.            3. Chronic systolic HF - 07/2019 echo LVEF 40-45%, inferior wall akinetic, basal inferolateral and apical inferior are hypokinetic.   08/2021 echo LVEF 40-45%, grade II dd. The inferior wall is akinetic. The basal inferolateral segment and apical inferior segment are hypokinetic - 08/2019 nuclear stress: large iferior infarct, no ischemia  - presumed ICM based on WMA, nuclear findings   - due to Cr we lowered entresto to 24/26mg  bid on 01/02/22      Jan 2024 echo LVEF 40-45%, grade I dd  - he had started jardiance, reports caused constipation - ongoing SOB/DOE, no recent edema - complian twith meds   4. Palpitations/SVT -  ER visit yesterday with palpitations, nausea, severe SOB - from notes EMS found him to be in SVT, given rounds of adenosine without improvement - ER notes mention EKG with SVT rate 200 - developed some hypotension, required cardioversion -after cardioversion discussions for transfer to [facility] main campus but patient signed out AMA  - Free T4 0.79 TSH 1.18 Mg 1.6 K 3.7 Cr 1.39 Hstrop 131-->279     - coreg caused SOB and fatigue, toprol much better tolerated.  - some palpitations at times, overall not bothersome       5. HTN - compliant with meds   6. History of diverticulitis - hemicolectomy 07/2019     7. Hyperlipidemia - changed to crestor 5mg  daily Jan 2023 - side effects on lipitor, stomach discomfort - taking crestor 10mg  daily, reports recently increased by pcp   01/2021 TC 187 TG 50 HDL 68 LDL 109   - 01/02/22 crestor increased to 20mg  daily -12/2021 TC 172 TG 21 HDL 77 LDL 91   - upcoming labs Thursday with pcp     6. Preoperative evaluation - considering prostate biopsy Past Medical History:  Diagnosis Date   Arthritis    CAD (coronary artery disease)    Chronic systolic heart failure (HCC)    Diverticulitis    s/p partial colectomy due to stenotic sigmoind colon   Dyspnea    Dysrhythmia  GERD (gastroesophageal reflux disease)    HTN (hypertension)    Myocardial infarction (HCC) 2021   Panic attacks    SVT (supraventricular tachycardia) (HCC)      Allergies  Allergen Reactions   Other     States he's allergic to a blood pressure pill that causes abdominal pain. He's not sure name of medication     Current Outpatient Medications  Medication Sig Dispense Refill   amLODipine (NORVASC) 10 MG tablet Take 1 tablet (10 mg total) by mouth daily. For Blood Pressure 30 tablet 5   aspirin EC 81 MG tablet Take 1 tablet (81 mg total) by mouth daily. Swallow whole. 90 tablet 3   chlorthalidone (HYGROTON) 25 MG tablet Take 0.5 tablets (12.5 mg total) by mouth  daily. 15 tablet 3   docusate sodium (COLACE) 100 MG capsule Take 100 mg by mouth daily as needed for mild constipation.     DULoxetine (CYMBALTA) 60 MG capsule Take 60 mg by mouth daily.     famotidine (PEPCID) 20 MG tablet Take 20 mg by mouth 2 (two) times daily. (Patient not taking: Reported on 02/28/2023)     ferrous sulfate 325 (65 FE) MG tablet 1 tablet with meal Orally daily for 30 day(s)     hydrocortisone (ANUSOL-HC) 2.5 % rectal cream Place 1 Application rectally 2 (two) times daily. 30 g 1   lidocaine (XYLOCAINE) 2 % solution place 15 mls ON back of THE TONGUE AND swallow EVERY 6 HOURS AS NEEDED FOR ABDOMINAL pain 300 mL 1   metoprolol succinate (TOPROL-XL) 100 MG 24 hr tablet TAKE 1 TABLET BY MOUTH DAILY with OR immediately following a meal 30 tablet 6   ondansetron (ZOFRAN) 8 MG tablet Take 8 mg by mouth every 8 (eight) hours as needed for vomiting or nausea.     pantoprazole (PROTONIX) 40 MG tablet Take 1 tablet (40 mg total) by mouth 2 (two) times daily before a meal. 60 tablet 3   polyethylene glycol (MIRALAX / GLYCOLAX) 17 g packet Take 17 g by mouth daily.     QUEtiapine (SEROQUEL) 25 MG tablet Take 25 mg by mouth at bedtime.     rosuvastatin (CRESTOR) 20 MG tablet TAKE 1 TABLET BY MOUTH EVERY DAY 30 tablet 3   sacubitril-valsartan (ENTRESTO) 24-26 MG TAKE 1 TABLET BY MOUTH TWICE DAILY 60 tablet 2   spironolactone (ALDACTONE) 25 MG tablet Take 0.5 tablets (12.5 mg total) by mouth daily.     sucralfate (CARAFATE) 1 g tablet TAKE 1 TABLET BY MOUTH FOUR TIMES DAILY WITH MEALS AND AT BEDTIME 120 tablet 3   Vitamin D, Ergocalciferol, 50000 units CAPS Take 1 capsule by mouth once a week.     No current facility-administered medications for this visit.     Past Surgical History:  Procedure Laterality Date   BIOPSY  03/04/2019   Procedure: BIOPSY;  Surgeon: Corbin Ade, MD;  Location: [facility];  Service: Endoscopy;;  gastric   BIOPSY  12/01/2021   Procedure: BIOPSY;   Surgeon: Corbin Ade, MD;  Location: [facility];  Service: Endoscopy;;   COLONOSCOPY     COLONOSCOPY WITH PROPOFOL N/A 03/04/2019   Surgeon: Corbin Ade, MD;  Diverticulosis in the entire colon with a stenotic, stiff, chronically inflamed sigmoid segment.  Low threshold for surgical consultation.  Grade 1 internal hemorrhoids. Repeat in 10 years.   COLONOSCOPY WITH PROPOFOL N/A 12/01/2021   Procedure: COLONOSCOPY WITH PROPOFOL;  Surgeon: Corbin Ade, MD;  Location: [facility];  Service: Endoscopy;  Laterality: N/A;  12:45 pm   ESOPHAGOGASTRODUODENOSCOPY (EGD) WITH PROPOFOL N/A 03/04/2019   Surgeon: Corbin Ade, MD;  gastric mucosal changes found to be chronic gastritis and focal intestinal metaplasia negative for H. 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   ESOPHAGOGASTRODUODENOSCOPY (EGD) WITH PROPOFOL N/A 12/01/2021   Procedure: ESOPHAGOGASTRODUODENOSCOPY (EGD) WITH PROPOFOL;  Surgeon: Corbin Ade, MD;  Location: [facility];  Service: Endoscopy;  Laterality: N/A;   LAPAROSCOPIC PARTIAL COLECTOMY N/A 07/14/2019   Procedure: LAPAROSCOPIC  LEFT HEMICOLECTOMY;  Surgeon: Lucretia Roers, MD;  Location: [facility];  Service: General;  Laterality: N/A;     Allergies  Allergen Reactions   Other     States he's allergic to a blood pressure pill that causes abdominal pain. He's not sure name of medication      Family History  Problem Relation Age of Onset   Lung cancer Father    Colon cancer Neg Hx    Colon polyps Neg Hx      Social History Mr. Weatherbee reports that he has been smoking cigarettes. He has a 6.3 pack-year smoking history. He has never used smokeless tobacco. Mr. Buresh reports no history of alcohol use.   Review of Systems CONSTITUTIONAL: No weight loss, fever, chills, weakness or fatigue.  HEENT: Eyes: No visual loss, blurred vision, double vision or yellow sclerae.No hearing loss,  sneezing, congestion, runny nose or sore throat.  SKIN: No rash or itching.  CARDIOVASCULAR:  RESPIRATORY: No shortness of breath, cough or sputum.  GASTROINTESTINAL: No anorexia, nausea, vomiting or diarrhea. No abdominal pain or blood.  GENITOURINARY: No burning on urination, no polyuria NEUROLOGICAL: No headache, dizziness, syncope, paralysis, ataxia, numbness or tingling in the extremities. No change in bowel or bladder control.  MUSCULOSKELETAL: No muscle, back pain, joint pain or stiffness.  LYMPHATICS: No enlarged nodes. No history of splenectomy.  PSYCHIATRIC: No history of depression or anxiety.  ENDOCRINOLOGIC: No reports of sweating, cold or heat intolerance. No polyuria or polydipsia.  Marland Kitchen   Physical Examination There were no vitals filed for this visit. There were no vitals filed for this visit.  Gen: resting comfortably, no acute distress HEENT: no scleral icterus, pupils equal round and reactive, no palptable cervical adenopathy,  CV Resp: Clear to auscultation bilaterally GI: abdomen is soft, non-tender, non-distended, normal bowel sounds, no hepatosplenomegaly MSK: extremities are warm, no edema.  Skin: warm, no rash Neuro:  no focal deficits Psych: appropriate affect   Diagnostic Studies  Jan 2024 echo IMPRESSIONS     1. Left ventricular ejection fraction, by estimation, is 40 to 45%. The  left ventricle has mildly decreased function. The left ventricle  demonstrates regional wall motion abnormalities (see scoring  diagram/findings for description). The left ventricular   internal cavity size was mildly to moderately dilated. There is mild left  ventricular hypertrophy. Left ventricular diastolic parameters are  consistent with Grade I diastolic dysfunction (impaired relaxation). The  average left ventricular global  longitudinal strain is -10.7 %. The global longitudinal strain is  abnormal.   2. Right ventricular systolic function is mildly reduced. The  right  ventricular size is normal. Tricuspid regurgitation signal is inadequate  for assessing PA pressure.   3. Left atrial size was mildly dilated.   4. The mitral valve is grossly normal. No evidence of mitral valve  regurgitation. No evidence of mitral stenosis.   5. The aortic  valve is tricuspid. Aortic valve regurgitation is not  visualized. No aortic stenosis is present.   6. Aortic dilatation noted. There is mild dilatation of the aortic root,  measuring 40 mm.   7. The inferior vena cava is dilated in size with >50% respiratory  variability, suggesting right atrial pressure of 8 mmHg.        Assessment and Plan   1.Chest pain - insurance company has consistently denies stress testing despite his symptoms - since last visit some decrease in frequency, severity but not resolved - we have discussed cath given prior echo findings and symptoms. With recent hemorroidal bleeding and upcoming banding as well as prostate biopsy would hold at this time.  - conitnue current meds   2. Chronic HFrEF - continue current meds, he is euvolemic today - not on SGLT2i, reported caused constipation     3. Dizziness - recent orthostatic dizzines - orthostatic vitals today are normal however - try lowering aldactone and following symptoms     4. HLD - upcoming labs with pcp   5.Preoperative evaluation - considering prostate biopsy - low risk neccesary procedure, recommend proceeding from cardiac standpoint. Can hold aspirin if needed.    6. PSVT - continue current meds, overall controlled - EKG today shows NSR       Antoine Poche, M.D., F.A.C.C.

## 2023-04-13 ENCOUNTER — Other Ambulatory Visit: Payer: Self-pay | Admitting: Gastroenterology

## 2023-04-13 DIAGNOSIS — R1013 Epigastric pain: Secondary | ICD-10-CM

## 2023-05-21 ENCOUNTER — Other Ambulatory Visit: Payer: Self-pay

## 2023-05-21 DIAGNOSIS — R972 Elevated prostate specific antigen [PSA]: Secondary | ICD-10-CM

## 2023-05-21 MED ORDER — LEVOFLOXACIN 750 MG PO TABS: 750.0000 mg | ORAL_TABLET | Freq: Every day | ORAL | 0 refills | Status: AC

## 2023-05-21 NOTE — Progress Notes (Signed)
 Risks, benefits, and some of the potential complications of a transrectal ultrasounds of the prostate (TRUSP) with biopsies were discussed at length with the patient including gross hematuria, blood in the bowel movements, hematospermia, bacteremia, infection, voiding discomfort, urinary retention, fever, chills, sepsis, blood transfusion, death, and others. All questions were answered. Informed consent was obtained. The patient confirmed that he had taken his pre-procedure antibiotic. All anticoagulants were discontinued prior to the procedure. The patient emptied his bladder. He was positioned in a comfortable left lateral decubitus position with hips and knees acutely flexed.  The rectal probe was inserted into the rectum without difficulty. 10cc of 2% Lidocaine without epinephrine was instilled with a spinal needle using ultrasound guidance near the junction of each seminal vesicle and the prostate.  Sequential transverse (axial) scans were made in small increments beginning at the seminal vesicles and ending at the prostatic apex. Sequential longitudinal (saggital) scans were made in small increments beginning at the right lateral prostate and ending at the left lateral prostate. Excellent anatomical imaging was obtained. The peripheral, transitional, and central zones were well-defined. The seminal vesicles were normal.  Prostate volume 27 ml.  There were no hypoechoic areas. 12 needle core biopsies were performed. 1 biopsy each was taken from the following areas:  Right lateral base, right medial base, right lateral mid prostate, right medial mid prostate, right lateral apical prostate, right medial apical prostate, left lateral base, left medial base, left lateral mid prostate, left medial mid prostate, left lateral apical prostate, left medial apical prostate. Minimal prostatic calcifications were noted. Excellent biopsy specimens were obtained.  Follow-up rectal examination was unremarkable.  The procedure was well-tolerated and without complications. Antibiotic instructions were given. The patient was told that:  For several days:  he should increase his fluid intake and limit strenuous activity  he might have mild discomfort at the base of his penis or in his rectum  he might have blood in his urine or blood in his bowel movements  For 2-3 months:  he might have blood in his ejaculate (semen)  Instructions were given to call the office immedicately for blood clots in the urine or bowel movements, difficulty urinating, inability to urinate, urinary retention, painful or frequent urination, fever, chills, nausea, vomiting, or other illness. The patient stated that he understood these instructions and would comply with them. We told the patient that prostate biopsy pathology reports are usually available within 3-5 working days, unless a pathologic second opinion is required, which may take 7-14 days. We told him to contact us  to check on the status of his biopsy if he has not heard from us  within 7 days. The patient left the ultrasound examination room in stable condition.

## 2023-05-21 NOTE — Progress Notes (Signed)
 Pt called to get another Rx for valium sent in for his upcoming biopsy Pt states he took it for his previous biopsy that had to be rescheduled  and he does not have another one Pt advised that a Rx would be sent to [facility]

## 2023-05-22 ENCOUNTER — Ambulatory Visit: Payer: MEDICAID | Admitting: Urology

## 2023-05-22 ENCOUNTER — Encounter (HOSPITAL_COMMUNITY): Payer: Self-pay

## 2023-05-22 ENCOUNTER — Other Ambulatory Visit: Payer: Self-pay | Admitting: Urology

## 2023-05-22 ENCOUNTER — Ambulatory Visit (HOSPITAL_COMMUNITY)
Admission: RE | Admit: 2023-05-22 | Discharge: 2023-05-22 | Disposition: A | Discharge status: Home / Self Care | Payer: MEDICAID | Source: Ambulatory Visit | Attending: Urology | Admitting: Urology

## 2023-05-22 DIAGNOSIS — R972 Elevated prostate specific antigen [PSA]: Secondary | ICD-10-CM

## 2023-05-22 DIAGNOSIS — C61 Malignant neoplasm of prostate: Secondary | ICD-10-CM | POA: Diagnosis not present

## 2023-05-22 MED ORDER — LIDOCAINE HCL (PF) 2 % IJ SOLN
INTRAMUSCULAR | Status: AC
  Filled 2023-05-22: qty 10

## 2023-05-22 MED ORDER — GENTAMICIN SULFATE 40 MG/ML IJ SOLN
INTRAMUSCULAR | Status: AC
  Filled 2023-05-22: qty 4

## 2023-05-22 MED ORDER — GENTAMICIN SULFATE 40 MG/ML IJ SOLN
160.0000 mg | Freq: Once | INTRAMUSCULAR | Status: AC
  Administered 2023-05-22: 160 mg via INTRAMUSCULAR

## 2023-05-22 MED ORDER — LIDOCAINE HCL (PF) 2 % IJ SOLN
10.0000 mL | Freq: Once | INTRAMUSCULAR | Status: AC
  Administered 2023-05-22: 10 mL

## 2023-05-22 NOTE — Progress Notes (Signed)
 PT tolerated prostate biopsy procedure and antibiotic injection well today. Labs obtained and taken to lab by Lexi from ultrasound for processing.  PT ambulatory at discharge with no acute distress noted and verbalized understanding of discharge instructions.

## 2023-05-23 LAB — SURGICAL PATHOLOGY

## 2023-06-03 ENCOUNTER — Ambulatory Visit: Payer: MEDICAID | Admitting: Urology

## 2023-06-07 ENCOUNTER — Telehealth: Payer: Self-pay | Admitting: Urology

## 2023-06-07 NOTE — Telephone Encounter (Signed)
 Said Dr D called him about biopsy and wants to go over the results. First available is June 16. He wanted sooner.

## 2023-06-11 ENCOUNTER — Telehealth: Payer: Self-pay

## 2023-06-11 NOTE — Telephone Encounter (Signed)
 Pt called to schedule an appt w/ MD Dahlstedt to go over biopsy results pt advised will reach out to MD b/c he's booked until June

## 2023-06-12 NOTE — Telephone Encounter (Signed)
 Called pt to confirm appt date and time for biopsy talk pt confirmed appt

## 2023-06-14 ENCOUNTER — Ambulatory Visit: Payer: MEDICAID | Attending: Cardiology | Admitting: Cardiology

## 2023-06-14 ENCOUNTER — Encounter: Payer: Self-pay | Admitting: Cardiology

## 2023-06-14 NOTE — Progress Notes (Deleted)
 Clinical Summary Anthony Vaughn is a 53 y.o.male  seen today for follow up of the following medical problems.        1. Presumed CAD - 08/2019 nuclear stress: large iferior infarct, no ischemia - 07/2019 echo inferior wall is akinetic.  - 08/2021 echo LVEF 40-45%, grade II dd. The inferior wall is akinetic. The basal inferolateral segment and apical inferior segment are hypokinetic    -at prior visit reported episode of chest pain wile  out mowing grass using push mower. Midchest, sharp 9/10 pain. Some nausea, some SOB. Stopped and rested inside house. Ongoing pain, lasted 20 minutes. Pain resolved on its own. Pain comes and goes since that time, typically with activity. Ongoing DOE that has progressed over the last few months     - unable to run on treadmill due SOB, chronic back pain, balance problems, dizziness   - still with chest pains at times. Episodes occur about once a week. - some decrease in frequency and severity since last visit - his insurance company denies a stress test 3 different times.        2. Dizziness - was at home sitting watching TV. Stood up and felt very dizzy, lightheaded and fell - reports drinks at least 4-6 bottles of water  daily.            3. Chronic systolic HF - 07/2019 echo LVEF 40-45%, inferior wall akinetic, basal inferolateral and apical inferior are hypokinetic.   08/2021 echo LVEF 40-45%, grade II dd. The inferior wall is akinetic. The basal inferolateral segment and apical inferior segment are hypokinetic - 08/2019 nuclear stress: large iferior infarct, no ischemia  - presumed ICM based on WMA, nuclear findings   - due to Cr we lowered entresto  to 24/26mg  bid on 01/02/22      Jan 2024 echo LVEF 40-45%, grade I dd  - he had started jardiance , reports caused constipation - ongoing SOB/DOE, no recent edema - complian twith meds   4. Palpitations/SVT - ER visit yesterday with palpitations, nausea, severe SOB - from notes EMS found  him to be in SVT, given rounds of adenosine without improvement - ER notes mention EKG with SVT rate 200 - developed some hypotension, required cardioversion -after cardioversion discussions for transfer to [facility] main campus but patient signed out AMA  - Free T4 0.79 TSH 1.18 Mg 1.6 K 3.7 Cr 1.39 Hstrop 131-->279     - coreg  caused SOB and fatigue, toprol  much better tolerated.  - some palpitations at times, overall not bothersome       5. HTN - compliant with meds   6. History of diverticulitis - hemicolectomy 07/2019     7. Hyperlipidemia - changed to crestor  5mg  daily Jan 2023 - side effects on lipitor, stomach discomfort - taking crestor  10mg  daily, reports recently increased by pcp   01/2021 TC 187 TG 50 HDL 68 LDL 109   - 01/02/22 crestor  increased to 20mg  daily -12/2021 TC 172 TG 21 HDL 77 LDL 91   - upcoming labs Thursday with pcp     6. Preoperative evaluation - considering prostate biopsy Past Medical History:  Diagnosis Date   Arthritis    CAD (coronary artery disease)    Chronic systolic heart failure (HCC)    Diverticulitis    s/p partial colectomy due to stenotic sigmoind colon   Dyspnea    Dysrhythmia    GERD (gastroesophageal reflux disease)    HTN (hypertension)  Myocardial infarction (HCC) 2021   Panic attacks    SVT (supraventricular tachycardia) (HCC)      Allergies  Allergen Reactions   Other     States he's allergic to a blood pressure pill that causes abdominal pain. He's not sure name of medication     Current Outpatient Medications  Medication Sig Dispense Refill   amLODipine  (NORVASC ) 10 MG tablet Take 1 tablet (10 mg total) by mouth daily. For Blood Pressure 30 tablet 5   aspirin  EC 81 MG tablet Take 1 tablet (81 mg total) by mouth daily. Swallow whole. 90 tablet 3   chlorthalidone  (HYGROTON ) 25 MG tablet Take 0.5 tablets (12.5 mg total) by mouth daily. 15 tablet 3   docusate sodium  (COLACE) 100 MG capsule Take 100 mg by mouth  daily as needed for mild constipation.     DULoxetine (CYMBALTA) 60 MG capsule Take 60 mg by mouth daily.     famotidine (PEPCID) 20 MG tablet Take 20 mg by mouth 2 (two) times daily. (Patient not taking: Reported on 02/28/2023)     ferrous sulfate 325 (65 FE) MG tablet 1 tablet with meal Orally daily for 30 day(s)     hydrocortisone  (ANUSOL -HC) 2.5 % rectal cream Place 1 Application rectally 2 (two) times daily. 30 g 1   lidocaine  (XYLOCAINE ) 2 % solution place 15 mls ON back of THE TONGUE AND swallow EVERY 6 HOURS AS NEEDED FOR ABDOMINAL pain 300 mL 1   metoprolol  succinate (TOPROL -XL) 100 MG 24 hr tablet TAKE 1 TABLET BY MOUTH DAILY with OR immediately following a meal 30 tablet 6   ondansetron  (ZOFRAN ) 8 MG tablet Take 8 mg by mouth every 8 (eight) hours as needed for vomiting or nausea.     pantoprazole  (PROTONIX ) 40 MG tablet Take 1 tablet (40 mg total) by mouth 2 (two) times daily before a meal. 60 tablet 3   polyethylene glycol (MIRALAX / GLYCOLAX) 17 g packet Take 17 g by mouth daily.     QUEtiapine (SEROQUEL) 25 MG tablet Take 25 mg by mouth at bedtime.     rosuvastatin  (CRESTOR ) 20 MG tablet TAKE 1 TABLET BY MOUTH EVERY DAY 30 tablet 3   sacubitril -valsartan  (ENTRESTO ) 24-26 MG TAKE 1 TABLET BY MOUTH TWICE DAILY 60 tablet 2   spironolactone  (ALDACTONE ) 25 MG tablet Take 0.5 tablets (12.5 mg total) by mouth daily.     sucralfate  (CARAFATE ) 1 g tablet TAKE 1 TABLET BY MOUTH FOUR TIMES DAILY WITH MEALS AND AT BEDTIME 120 tablet 3   Vitamin D, Ergocalciferol, 50000 units CAPS Take 1 capsule by mouth once a week.     No current facility-administered medications for this visit.     Past Surgical History:  Procedure Laterality Date   BIOPSY  03/04/2019   Procedure: BIOPSY;  Surgeon: Suzette Espy, MD;  Location: [facility];  Service: Endoscopy;;  gastric   BIOPSY  12/01/2021   Procedure: BIOPSY;  Surgeon: Suzette Espy, MD;  Location: [facility];  Service: Endoscopy;;    COLONOSCOPY     COLONOSCOPY WITH PROPOFOL  N/A 03/04/2019   Surgeon: Suzette Espy, MD;  Diverticulosis in the entire colon with a stenotic, stiff, chronically inflamed sigmoid segment.  Low threshold for surgical consultation.  Grade 1 internal hemorrhoids. Repeat in 10 years.   COLONOSCOPY WITH PROPOFOL  N/A 12/01/2021   Procedure: COLONOSCOPY WITH PROPOFOL ;  Surgeon: Suzette Espy, MD;  Location: [facility];  Service: Endoscopy;  Laterality: N/A;  12:45  pm   ESOPHAGOGASTRODUODENOSCOPY (EGD) WITH PROPOFOL  N/A 03/04/2019   Surgeon: Suzette Espy, MD;  gastric mucosal changes found to be chronic gastritis and focal intestinal metaplasia negative for H. 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   ESOPHAGOGASTRODUODENOSCOPY (EGD) WITH PROPOFOL  N/A 12/01/2021   Procedure: ESOPHAGOGASTRODUODENOSCOPY (EGD) WITH PROPOFOL ;  Surgeon: Suzette Espy, MD;  Location: [facility];  Service: Endoscopy;  Laterality: N/A;   LAPAROSCOPIC PARTIAL COLECTOMY N/A 07/14/2019   Procedure: LAPAROSCOPIC  LEFT HEMICOLECTOMY;  Surgeon: Awilda Bogus, MD;  Location: [facility];  Service: General;  Laterality: N/A;     Allergies  Allergen Reactions   Other     States he's allergic to a blood pressure pill that causes abdominal pain. He's not sure name of medication      Family History  Problem Relation Age of Onset   Lung cancer Father    Colon cancer Neg Hx    Colon polyps Neg Hx      Social History Anthony Vaughn reports that he has been smoking cigarettes. He has a 6.3 pack-year smoking history. He has never used smokeless tobacco. Anthony Vaughn reports no history of alcohol use.   Review of Systems CONSTITUTIONAL: No weight loss, fever, chills, weakness or fatigue.  HEENT: Eyes: No visual loss, blurred vision, double vision or yellow sclerae.No hearing loss, sneezing, congestion, runny nose or sore throat.  SKIN: No rash or itching.   CARDIOVASCULAR:  RESPIRATORY: No shortness of breath, cough or sputum.  GASTROINTESTINAL: No anorexia, nausea, vomiting or diarrhea. No abdominal pain or blood.  GENITOURINARY: No burning on urination, no polyuria NEUROLOGICAL: No headache, dizziness, syncope, paralysis, ataxia, numbness or tingling in the extremities. No change in bowel or bladder control.  MUSCULOSKELETAL: No muscle, back pain, joint pain or stiffness.  LYMPHATICS: No enlarged nodes. No history of splenectomy.  PSYCHIATRIC: No history of depression or anxiety.  ENDOCRINOLOGIC: No reports of sweating, cold or heat intolerance. No polyuria or polydipsia.  Anthony Vaughn   Physical Examination There were no vitals filed for this visit. There were no vitals filed for this visit.  Gen: resting comfortably, no acute distress HEENT: no scleral icterus, pupils equal round and reactive, no palptable cervical adenopathy,  CV Resp: Clear to auscultation bilaterally GI: abdomen is soft, non-tender, non-distended, normal bowel sounds, no hepatosplenomegaly MSK: extremities are warm, no edema.  Skin: warm, no rash Neuro:  no focal deficits Psych: appropriate affect   Diagnostic Studies Jan 2024 echo IMPRESSIONS     1. Left ventricular ejection fraction, by estimation, is 40 to 45%. The  left ventricle has mildly decreased function. The left ventricle  demonstrates regional wall motion abnormalities (see scoring  diagram/findings for description). The left ventricular   internal cavity size was mildly to moderately dilated. There is mild left  ventricular hypertrophy. Left ventricular diastolic parameters are  consistent with Grade I diastolic dysfunction (impaired relaxation). The  average left ventricular global  longitudinal strain is -10.7 %. The global longitudinal strain is  abnormal.   2. Right ventricular systolic function is mildly reduced. The right  ventricular size is normal. Tricuspid regurgitation signal is  inadequate  for assessing PA pressure.   3. Left atrial size was mildly dilated.   4. The mitral valve is grossly normal. No evidence of mitral valve  regurgitation. No evidence of mitral stenosis.   5. The aortic valve is tricuspid. Aortic valve regurgitation is not  visualized. No aortic stenosis  is present.   6. Aortic dilatation noted. There is mild dilatation of the aortic root,  measuring 40 mm.   7. The inferior vena cava is dilated in size with >50% respiratory  variability, suggesting right atrial pressure of 8 mmHg.         Assessment and Plan   1.Chest pain - insurance company has consistently denies stress testing despite his symptoms - since last visit some decrease in frequency, severity but not resolved - we have discussed cath given prior echo findings and symptoms. With recent hemorroidal bleeding and upcoming banding as well as prostate biopsy would hold at this time.  - conitnue current meds   2. Chronic HFrEF - continue current meds, he is euvolemic today - not on SGLT2i, reported caused constipation     3. Dizziness - recent orthostatic dizzines - orthostatic vitals today are normal however - try lowering aldactone  and following symptoms     4. HLD - upcoming labs with pcp   5.Preoperative evaluation - considering prostate biopsy - low risk neccesary procedure, recommend proceeding from cardiac standpoint. Can hold aspirin  if needed.    6. PSVT - continue current meds, overall controlled - EKG today shows NSR     Laurann Pollock, M.D., F.A.C.C.

## 2023-06-18 NOTE — Progress Notes (Incomplete)
 History of Present Illness:   Anthony Vaughn is here today for prostate cancer consultation  4.15.2025: TRUS/Bx. PSA 8.7, prostate volume 27.3 mL, PSAD 0.32.  6/12 cores revealed adenocarcinoma:  4 cores (right mid lateral, left base medial, left base lateral, left mid lateral) revealed GS 3+3 pattern in 5, 30, 90 and 60% of cores respectively 2 cores (left mid medial, left apex lateral) revealed GS 3+4 pattern and 40 and 30% of cores respectively   Past Medical History:  Diagnosis Date   Arthritis    CAD (coronary artery disease)    Chronic systolic heart failure (HCC)    Diverticulitis    s/p partial colectomy due to stenotic sigmoind colon   Dyspnea    Dysrhythmia    GERD (gastroesophageal reflux disease)    HTN (hypertension)    Myocardial infarction (HCC) 2021   Panic attacks    SVT (supraventricular tachycardia) (HCC)     Past Surgical History:  Procedure Laterality Date   BIOPSY  03/04/2019   Procedure: BIOPSY;  Surgeon: Suzette Espy, MD;  Location: [facility];  Service: Endoscopy;;  gastric   BIOPSY  12/01/2021   Procedure: BIOPSY;  Surgeon: Suzette Espy, MD;  [facility]: [facility];  Service: Endoscopy;;   COLONOSCOPY     COLONOSCOPY WITH PROPOFOL  N/A 03/04/2019   Surgeon: Suzette Espy, MD;  Diverticulosis in the entire colon with a stenotic, stiff, chronically inflamed sigmoid segment.  Low threshold for surgical consultation.  Grade 1 internal hemorrhoids. Repeat in 10 years.   COLONOSCOPY WITH PROPOFOL  N/A 12/01/2021   Procedure: COLONOSCOPY WITH PROPOFOL ;  Surgeon: Suzette Espy, MD;  Location: [facility];  Service: Endoscopy;  Laterality: N/A;  12:45 pm   ESOPHAGOGASTRODUODENOSCOPY (EGD) WITH PROPOFOL  N/A 03/04/2019   Surgeon: Suzette Espy, MD;  gastric mucosal changes found to be chronic gastritis and focal intestinal metaplasia negative for H. 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    ESOPHAGOGASTRODUODENOSCOPY (EGD) WITH PROPOFOL  N/A 12/01/2021   Procedure: ESOPHAGOGASTRODUODENOSCOPY (EGD) WITH PROPOFOL ;  Surgeon: Suzette Espy, MD;  [facility]: [facility];  Service: Endoscopy;  Laterality: N/A;   LAPAROSCOPIC PARTIAL COLECTOMY N/A 07/14/2019   Procedure: LAPAROSCOPIC  LEFT HEMICOLECTOMY;  Surgeon: Awilda Bogus, MD;  Location: [facility];  Service: General;  Laterality: N/A;    Home Medications:  Allergies as of 06/19/2023       Reactions   Other    States he's allergic to a blood pressure pill that causes abdominal pain. He's not sure name of medication        Medication List        Accurate as of Jun 18, 2023 12:41 PM. If you have any questions, ask your nurse or doctor.          amLODipine  10 MG tablet Commonly known as: NORVASC  Take 1 tablet (10 mg total) by mouth daily. For Blood Pressure   aspirin  EC 81 MG tablet Take 1 tablet (81 mg total) by mouth daily. Swallow whole.   chlorthalidone  25 MG tablet Commonly known as: HYGROTON  Take 0.5 tablets (12.5 mg total) by mouth daily.   docusate sodium  100 MG capsule Commonly known as: COLACE Take 100 mg by mouth daily as needed for mild constipation.   DULoxetine 60 MG capsule Commonly known as: CYMBALTA Take 60 mg by mouth daily.   Entresto  24-26 MG Generic drug: sacubitril -valsartan  TAKE 1 TABLET BY MOUTH TWICE DAILY   famotidine  20 MG tablet Commonly known as: PEPCID Take 20 mg by mouth 2 (two) times daily.   ferrous sulfate 325 (65 FE) MG tablet 1 tablet with meal Orally daily for 30 day(s)   hydrocortisone  2.5 % rectal cream Commonly known as: Anusol -HC Place 1 Application rectally 2 (two) times daily.   lidocaine  2 % solution Commonly known as: XYLOCAINE  place 15 mls ON back of THE TONGUE AND swallow EVERY 6 HOURS AS NEEDED FOR ABDOMINAL pain   metoprolol  succinate 100 MG 24 hr tablet Commonly known as: TOPROL -XL TAKE 1 TABLET BY MOUTH DAILY with OR immediately following a  meal   ondansetron  8 MG tablet Commonly known as: ZOFRAN  Take 8 mg by mouth every 8 (eight) hours as needed for vomiting or nausea.   pantoprazole  40 MG tablet Commonly known as: Protonix  Take 1 tablet (40 mg total) by mouth 2 (two) times daily before a meal.   polyethylene glycol 17 g packet Commonly known as: MIRALAX / GLYCOLAX Take 17 g by mouth daily.   QUEtiapine 25 MG tablet Commonly known as: SEROQUEL Take 25 mg by mouth at bedtime.   rosuvastatin  20 MG tablet Commonly known as: CRESTOR  TAKE 1 TABLET BY MOUTH EVERY DAY   spironolactone  25 MG tablet Commonly known as: ALDACTONE  Take 0.5 tablets (12.5 mg total) by mouth daily.   sucralfate  1 g tablet Commonly known as: CARAFATE  TAKE 1 TABLET BY MOUTH FOUR TIMES DAILY WITH MEALS AND AT BEDTIME   Vitamin D (Ergocalciferol) 50000 units Caps Take 1 capsule by mouth once a week.        Allergies:  Allergies  Allergen Reactions   Other     States he's allergic to a blood pressure pill that causes abdominal pain. He's not sure name of medication    Family History  Problem Relation Age of Onset   Lung cancer Father    Colon cancer Neg Hx    Colon polyps Neg Hx     Social History:  reports that he has been smoking cigarettes. He has a 6.3 pack-year smoking history. He has never used smokeless tobacco. He reports that he does not drink alcohol and does not use drugs.  ROS: A complete review of systems was performed.  All systems are negative except for pertinent findings as noted.  Physical Exam:  Vital signs in last 24 hours: There were no vitals taken for this visit. Constitutional:  Alert and oriented, No acute distress Cardiovascular: Regular rate  Respiratory: Normal respiratory effort GI: Abdomen is soft, nontender, nondistended, no abdominal masses. No CVAT.  Genitourinary: Normal male phallus, testes are descended bilaterally and non-tender and without masses, scrotum is normal in appearance without  lesions or masses, perineum is normal on inspection. Lymphatic: No lymphadenopathy Neurologic: Grossly intact, no focal deficits Psychiatric: Normal mood and affect  I have reviewed prior pt notes  I have reviewed notes from referring/previous physicians  I have reviewed urinalysis results  I have independently reviewed prior imaging  I have reviewed prior PSA results  I have reviewed prior urine culture   Impression/Assessment:  Favorable intermediate risk prostate cancer in healthy 53 year old male. Nomogram predictions: OCD--60% Lymph nodes/seminal vesicle invasion--5/5% Progression free probability after radical prostatectomy at 5/10 years--78/65%  Plan:  ***

## 2023-06-19 ENCOUNTER — Ambulatory Visit: Payer: MEDICAID | Admitting: Urology

## 2023-06-19 DIAGNOSIS — C61 Malignant neoplasm of prostate: Secondary | ICD-10-CM

## 2023-07-06 ENCOUNTER — Telehealth: Payer: Self-pay

## 2023-07-06 NOTE — Telephone Encounter (Signed)
 Pt called to let us  know he missed his previous appt due to being sick pt was advised that a message will be sent to MD for appt time due to MD schedule being booked out until July pt stated that he does have an active mychart and would be okay to do a virtual appt if needed

## 2023-07-06 NOTE — Telephone Encounter (Signed)
 Called pt and lvm that a sooner appt for biopsy talk has been scheduled for 06/03

## 2023-07-06 NOTE — Telephone Encounter (Signed)
 Please double book per MD request

## 2023-07-09 NOTE — Progress Notes (Signed)
 History of Present Illness: Anthony Vaughn is here today to discuss his new diagnosis of prostate cancer as well as ventral therapy.  4.15.2025: He underwent ultrasound and biopsy of his prostate.  At that time, PSA was 8.7, prostate volume 27 mL, PSAD 0.32.  6/12 cores revealed adenocarcinoma: -4 cores revealed GS 3+3 pattern (right mid lateral, 5% of core involved; left base medial; 30% of core involved; left base lateral, 90% of core involved; left mid lateral, 60% of core involved) -2 cores revealed GS 3+4 pattern (left mid medial, 40% of core involved; left apex lateral, 30% of core involved)   Past Medical History:  Diagnosis Date   Arthritis    CAD (coronary artery disease)    Chronic systolic heart failure (HCC)    Diverticulitis    s/p partial colectomy due to stenotic sigmoind colon   Dyspnea    Dysrhythmia    GERD (gastroesophageal reflux disease)    HTN (hypertension)    Myocardial infarction (HCC) 2021   Panic attacks    SVT (supraventricular tachycardia) (HCC)     Past Surgical History:  Procedure Laterality Date   BIOPSY  03/04/2019   Procedure: BIOPSY;  Surgeon: Suzette Espy, MD;  Location: [facility];  Service: Endoscopy;;  gastric   BIOPSY  12/01/2021   Procedure: BIOPSY;  Surgeon: Suzette Espy, MD;  Location: [facility];  Service: Endoscopy;;   COLONOSCOPY     COLONOSCOPY WITH PROPOFOL  N/A 03/04/2019   Surgeon: Suzette Espy, MD;  Diverticulosis in the entire colon with a stenotic, stiff, chronically inflamed sigmoid segment.  Low threshold for surgical consultation.  Grade 1 internal hemorrhoids. Repeat in 10 years.   COLONOSCOPY WITH PROPOFOL  N/A 12/01/2021   Procedure: COLONOSCOPY WITH PROPOFOL ;  Surgeon: Suzette Espy, MD;  Location: [facility];  Service: Endoscopy;  Laterality: N/A;  12:45 pm   ESOPHAGOGASTRODUODENOSCOPY (EGD) WITH PROPOFOL  N/A 03/04/2019   Surgeon: Suzette Espy, MD;  gastric mucosal changes found to be chronic gastritis and  focal intestinal metaplasia negative for H. 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   ESOPHAGOGASTRODUODENOSCOPY (EGD) WITH PROPOFOL  N/A 12/01/2021   Procedure: ESOPHAGOGASTRODUODENOSCOPY (EGD) WITH PROPOFOL ;  Surgeon: Suzette Espy, MD;  Location: [facility];  Service: Endoscopy;  Laterality: N/A;   LAPAROSCOPIC PARTIAL COLECTOMY N/A 07/14/2019   Procedure: LAPAROSCOPIC  LEFT HEMICOLECTOMY;  Surgeon: Awilda Bogus, MD;  Location: [facility];  Service: General;  Laterality: N/A;    Home Medications:  Allergies as of 07/10/2023       Reactions   Other    States he's allergic to a blood pressure pill that causes abdominal pain. He's not sure name of medication        Medication List        Accurate as of July 09, 2023  7:25 PM. If you have any questions, ask your nurse or doctor.          amLODipine  10 MG tablet Commonly known as: NORVASC  Take 1 tablet (10 mg total) by mouth daily. For Blood Pressure   aspirin  EC 81 MG tablet Take 1 tablet (81 mg total) by mouth daily. Swallow whole.   chlorthalidone  25 MG tablet Commonly known as: HYGROTON  Take 0.5 tablets (12.5 mg total) by mouth daily.   docusate sodium  100 MG capsule Commonly known as: COLACE Take 100 mg by mouth daily as needed for mild constipation.   DULoxetine 60 MG capsule  Commonly known as: CYMBALTA Take 60 mg by mouth daily.   Entresto  24-26 MG Generic drug: sacubitril -valsartan  TAKE 1 TABLET BY MOUTH TWICE DAILY   famotidine 20 MG tablet Commonly known as: PEPCID Take 20 mg by mouth 2 (two) times daily.   ferrous sulfate 325 (65 FE) MG tablet 1 tablet with meal Orally daily for 30 day(s)   hydrocortisone  2.5 % rectal cream Commonly known as: Anusol -HC Place 1 Application rectally 2 (two) times daily.   lidocaine  2 % solution Commonly known as: XYLOCAINE  place 15 mls ON back of THE TONGUE AND swallow EVERY 6 HOURS AS NEEDED FOR  ABDOMINAL pain   metoprolol  succinate 100 MG 24 hr tablet Commonly known as: TOPROL -XL TAKE 1 TABLET BY MOUTH DAILY with OR immediately following a meal   ondansetron  8 MG tablet Commonly known as: ZOFRAN  Take 8 mg by mouth every 8 (eight) hours as needed for vomiting or nausea.   pantoprazole  40 MG tablet Commonly known as: Protonix  Take 1 tablet (40 mg total) by mouth 2 (two) times daily before a meal.   polyethylene glycol 17 g packet Commonly known as: MIRALAX / GLYCOLAX Take 17 g by mouth daily.   QUEtiapine 25 MG tablet Commonly known as: SEROQUEL Take 25 mg by mouth at bedtime.   rosuvastatin  20 MG tablet Commonly known as: CRESTOR  TAKE 1 TABLET BY MOUTH EVERY DAY   spironolactone  25 MG tablet Commonly known as: ALDACTONE  Take 0.5 tablets (12.5 mg total) by mouth daily.   sucralfate  1 g tablet Commonly known as: CARAFATE  TAKE 1 TABLET BY MOUTH FOUR TIMES DAILY WITH MEALS AND AT BEDTIME   Vitamin D (Ergocalciferol) 50000 units Caps Take 1 capsule by mouth once a week.        Allergies:  Allergies  Allergen Reactions   Other     States he's allergic to a blood pressure pill that causes abdominal pain. He's not sure name of medication    Family History  Problem Relation Age of Onset   Lung cancer Father    Colon cancer Neg Hx    Colon polyps Neg Hx     Social History:  reports that he has been smoking cigarettes. He has a 6.3 pack-year smoking history. He has never used smokeless tobacco. He reports that he does not drink alcohol and does not use drugs.  ROS: A complete review of systems was performed.  All systems are negative except for pertinent findings as noted.   I have reviewed prior pt notes  I have reviewed urinalysis results  I have reviewed prior PSA and pathology results  I have reviewed ultrasound of the prostate   Impression/Assessment:  Grade group 2 prostate cancer, new diagnosis  Nomogram predictions: -OCD-60% -Lymph  nodes/seminal vesicle involvement-5% each -Progression free probability after radical prostatectomy at 5/10 years--78/65% respectively  Plan: The patient was counseled about the natural history of prostate cancer and the standard treatment options that are available for prostate cancer. It was explained to him how his age and life expectancy, clinical stage, Gleason score, and PSA affect his prognosis, the decision to proceed with additional staging studies, as well as how that information influences recommended treatment strategies. We discussed the roles for active surveillance, radiation therapy, surgical therapy, androgen deprivation, as well as ablative therapy options for the treatment of prostate cancer as appropriate to his individual cancer situation. We discussed the risks and benefits of these options with regard to their impact on cancer control and also in  terms of potential adverse events, complications, and impact on quality of life particularly related to urinary, bowel, and sexual function. The patient was encouraged to ask questions throughout the discussion today and all questions were answered to his stated satisfaction. In addition, the patient was provided with and/or directed to appropriate resources and literature for further education about prostate cancer and treatment options.  In the average 53 year old African-American male, I would strongly recommend surgery, especially with his high PSA density.  However, he does have cardiac issues being followed by Dr. Amanda Jungling.  This might affect any anesthetic procedure.  For now, we will put any treatment on hold.  I will communicate with Dr. Amanda Jungling about viability anesthetic procedure down the road and communicate with [facility] afterwards.  I will see him back in about 2 months following PSA.

## 2023-07-10 ENCOUNTER — Ambulatory Visit (INDEPENDENT_AMBULATORY_CARE_PROVIDER_SITE_OTHER): Payer: MEDICAID | Admitting: Urology

## 2023-07-10 ENCOUNTER — Encounter: Payer: Self-pay | Admitting: Urology

## 2023-07-10 VITALS — BP 103/70 | HR 51

## 2023-07-10 DIAGNOSIS — C61 Malignant neoplasm of prostate: Secondary | ICD-10-CM | POA: Diagnosis not present

## 2023-08-18 ENCOUNTER — Other Ambulatory Visit: Payer: Self-pay | Admitting: Gastroenterology

## 2023-08-18 DIAGNOSIS — R1013 Epigastric pain: Secondary | ICD-10-CM

## 2023-08-27 ENCOUNTER — Other Ambulatory Visit: Payer: Self-pay | Admitting: Gastroenterology

## 2023-08-27 DIAGNOSIS — K625 Hemorrhage of anus and rectum: Secondary | ICD-10-CM

## 2023-08-27 DIAGNOSIS — K649 Unspecified hemorrhoids: Secondary | ICD-10-CM

## 2023-09-05 ENCOUNTER — Ambulatory Visit: Payer: MEDICAID | Admitting: Cardiology

## 2023-09-05 NOTE — Progress Notes (Deleted)
 Clinical Summary [name] is a 53 y.o.male   Past Medical History:  Diagnosis Date   Arthritis    CAD (coronary artery disease)    Chronic systolic heart failure (HCC)    Diverticulitis    s/p partial colectomy due to stenotic sigmoind colon   Dyspnea    Dysrhythmia    GERD (gastroesophageal reflux disease)    HTN (hypertension)    Myocardial infarction (HCC) 2021   Panic attacks    SVT (supraventricular tachycardia) (HCC)      Allergies  Allergen Reactions   Other     States he's allergic to a blood pressure pill that causes abdominal pain. He's not sure name of medication     Current Outpatient Medications  Medication Sig Dispense Refill   amLODipine  (NORVASC ) 10 MG tablet Take 1 tablet (10 mg total) by mouth daily. For Blood Pressure 30 tablet 5   aspirin  EC 81 MG tablet Take 1 tablet (81 mg total) by mouth daily. Swallow whole. 90 tablet 3   chlorthalidone  (HYGROTON ) 25 MG tablet Take 0.5 tablets (12.5 mg total) by mouth daily. 15 tablet 3   docusate sodium  (COLACE) 100 MG capsule Take 100 mg by mouth daily as needed for mild constipation.     DULoxetine (CYMBALTA) 60 MG capsule Take 60 mg by mouth daily.     famotidine (PEPCID) 20 MG tablet Take 20 mg by mouth 2 (two) times daily. (Patient not taking: Reported on 02/28/2023)     ferrous sulfate 325 (65 FE) MG tablet 1 tablet with meal Orally daily for 30 day(s)     hydrocortisone  2.5 % cream apply rectally TWICE DAILY 30 g 1   lidocaine  (XYLOCAINE ) 2 % solution place 15 mls ON back of THE TONGUE AND swallow EVERY 6 HOURS AS NEEDED FOR ABDOMINAL pain 300 mL 1   metoprolol  succinate (TOPROL -XL) 100 MG 24 hr tablet TAKE 1 TABLET BY MOUTH DAILY with OR immediately following a meal 30 tablet 6   ondansetron  (ZOFRAN ) 8 MG tablet Take 8 mg by mouth every 8 (eight) hours as needed for vomiting or nausea.     pantoprazole  (PROTONIX ) 40 MG tablet Take 1 tablet (40 mg total) by mouth 2 (two) times daily before a meal.  60 tablet 3   polyethylene glycol (MIRALAX / GLYCOLAX) 17 g packet Take 17 g by mouth daily.     QUEtiapine (SEROQUEL) 25 MG tablet Take 25 mg by mouth at bedtime.     rosuvastatin  (CRESTOR ) 20 MG tablet TAKE 1 TABLET BY MOUTH EVERY DAY 30 tablet 3   sacubitril -valsartan  (ENTRESTO ) 24-26 MG TAKE 1 TABLET BY MOUTH TWICE DAILY 60 tablet 2   spironolactone  (ALDACTONE ) 25 MG tablet Take 0.5 tablets (12.5 mg total) by mouth daily.     sucralfate  (CARAFATE ) 1 g tablet TAKE 1 TABLET BY MOUTH THREE TIMES DAILY WITH MEALS AND AT BEDTIME (4 a DAY) 120 tablet 3   Vitamin D, Ergocalciferol, 50000 units CAPS Take 1 capsule by mouth once a week.     No current facility-administered medications for this visit.     Past Surgical History:  Procedure Laterality Date   BIOPSY  03/04/2019   Procedure: BIOPSY;  Surgeon: Shaaron Lamar HERO, MD;  Location: [facility];  Service: Endoscopy;;  gastric   BIOPSY  12/01/2021   Procedure: BIOPSY;  Surgeon: Shaaron Lamar HERO, MD;  Location: [facility];  Service: Endoscopy;;   COLONOSCOPY     COLONOSCOPY WITH  PROPOFOL  N/A 03/04/2019   Surgeon: Shaaron Lamar HERO, MD;  Diverticulosis in the entire colon with a stenotic, stiff, chronically inflamed sigmoid segment.  Low threshold for surgical consultation.  Grade 1 internal hemorrhoids. Repeat in 10 years.   COLONOSCOPY WITH PROPOFOL  N/A 12/01/2021   Procedure: COLONOSCOPY WITH PROPOFOL ;  Surgeon: Shaaron Lamar HERO, MD;  [facility]: [facility];  Service: Endoscopy;  Laterality: N/A;  12:45 pm   ESOPHAGOGASTRODUODENOSCOPY (EGD) WITH PROPOFOL  N/A 03/04/2019   Surgeon: Shaaron Lamar HERO, MD;  gastric mucosal changes found to be chronic gastritis and focal intestinal metaplasia negative for H. 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   ESOPHAGOGASTRODUODENOSCOPY (EGD) WITH PROPOFOL  N/A 12/01/2021   Procedure: ESOPHAGOGASTRODUODENOSCOPY (EGD) WITH PROPOFOL ;  Surgeon: Shaaron Lamar HERO, MD;  [facility]: [facility];  Service: Endoscopy;  Laterality: N/A;   LAPAROSCOPIC PARTIAL COLECTOMY N/A 07/14/2019   Procedure: LAPAROSCOPIC  LEFT HEMICOLECTOMY;  Surgeon: Kallie Manuelita BROCKS, MD;  Location: [facility];  Service: General;  Laterality: N/A;     Allergies  Allergen Reactions   Other     States he's allergic to a blood pressure pill that causes abdominal pain. He's not sure name of medication      Family History  Problem Relation Age of Onset   Lung cancer Father    Colon cancer Neg Hx    Colon polyps Neg Hx      Social History [name] reports that he has been smoking cigarettes. He has a 6.3 pack-year smoking history. He has never used smokeless tobacco. [name] reports no history of alcohol use.   Review of Systems CONSTITUTIONAL: No weight loss, fever, chills, weakness or fatigue.  HEENT: Eyes: No visual loss, blurred vision, double vision or yellow sclerae.No hearing loss, sneezing, congestion, runny nose or sore throat.  SKIN: No rash or itching.  CARDIOVASCULAR:  RESPIRATORY: No shortness of breath, cough or sputum.  GASTROINTESTINAL: No anorexia, nausea, vomiting or diarrhea. No abdominal pain or blood.  GENITOURINARY: No burning on urination, no polyuria NEUROLOGICAL: No headache, dizziness, syncope, paralysis, ataxia, numbness or tingling in the extremities. No change in bowel or bladder control.  MUSCULOSKELETAL: No muscle, back pain, joint pain or stiffness.  LYMPHATICS: No enlarged nodes. No history of splenectomy.  PSYCHIATRIC: No history of depression or anxiety.  ENDOCRINOLOGIC: No reports of sweating, cold or heat intolerance. No polyuria or polydipsia.  Anthony Vaughn   Physical Examination There were no vitals filed for this visit. There were no vitals filed for this visit.  Gen: resting comfortably, no acute distress HEENT: no scleral icterus, pupils equal round and reactive, no palptable cervical adenopathy,  CV Resp: Clear to  auscultation bilaterally GI: abdomen is soft, non-tender, non-distended, normal bowel sounds, no hepatosplenomegaly MSK: extremities are warm, no edema.  Skin: warm, no rash Neuro:  no focal deficits Psych: appropriate affect   Diagnostic Studies     Assessment and Plan        Dorn PHEBE Ross, M.D., F.A.C.C.

## 2023-09-11 ENCOUNTER — Ambulatory Visit: Payer: MEDICAID | Admitting: Urology

## 2023-10-01 ENCOUNTER — Telehealth: Payer: Self-pay | Admitting: Cardiology

## 2023-10-01 NOTE — Telephone Encounter (Signed)
 Pt c/o of Chest Pain: STAT if active (IN THIS MOMENT) CP, including tightness, pressure, jaw pain, shoulder/upper arm/back pain, SOB, nausea, and vomiting.  1. Are you having CP right now (tightness, pressure, or discomfort)? All of the three   2. Are you experiencing any other symptoms (ex. SOB, nausea, vomiting, sweating)? Sweating, nausea   3. How long have you been experiencing CP? week  4. Is your CP continuous or coming and going? Coming and going   5. Have you taken Nitroglycerin? No   6. If CP returns before callback, please consider calling 911. ?   Patient has to call there ambulance stated his bp was sky high. Please advise

## 2023-10-01 NOTE — Telephone Encounter (Signed)
 Patient called in stated that last Saturday he called 911. They came out they stated that he was disoriented performed EKG, but was unsure what they told him. He refused to go to the ER said he would just take some heart medication and he will be ok. He started feeling tired for about a week now, but currently not experiencing any other symptoms like before. Has an appointment scheduled in 11/2023 is wandering if he needs to be seen sooner. Advised him will route over to provider.

## 2023-10-02 NOTE — Telephone Encounter (Signed)
 Per Dr. Alvan Response:  I would recommend ER evaluation. We have not been able to get neccesary heart testing done as an outpatient due to issues with insurance. If admitted would be able to get testing done.    JINNY Alvan MD  Advised patient of his recommendations and verbalized understanding.

## 2023-10-08 NOTE — Progress Notes (Incomplete)
 History of Present Illness:   4.15.2025: He underwent ultrasound and biopsy of his prostate.  At that time, PSA was 8.7, prostate volume 27 mL, PSAD 0.32.  6/12 cores revealed adenocarcinoma: -4 cores revealed GS 3+3 pattern (right mid lateral, 5% of core involved; left base medial; 30% of core involved; left base lateral, 90% of core involved; left mid lateral, 60% of core involved) -2 cores revealed GS 3+4 pattern (left mid medial, 40% of core involved; left apex lateral, 30% of core involved)   Past Medical History:  Diagnosis Date   Arthritis    CAD (coronary artery disease)    Chronic systolic heart failure (HCC)    Diverticulitis    s/p partial colectomy due to stenotic sigmoind colon   Dyspnea    Dysrhythmia    GERD (gastroesophageal reflux disease)    HTN (hypertension)    Myocardial infarction (HCC) 2021   Panic attacks    SVT (supraventricular tachycardia) (HCC)     Past Surgical History:  Procedure Laterality Date   BIOPSY  03/04/2019   Procedure: BIOPSY;  Surgeon: Shaaron Lamar HERO, MD;  [facility]: [facility];  Service: Endoscopy;;  gastric   BIOPSY  12/01/2021   Procedure: BIOPSY;  Surgeon: Shaaron Lamar HERO, MD;  [facility];  Service: Endoscopy;;   COLONOSCOPY     COLONOSCOPY WITH PROPOFOL  N/A 03/04/2019   Surgeon: Shaaron Lamar HERO, MD;  Diverticulosis in the entire colon with a stenotic, stiff, chronically inflamed sigmoid segment.  Low threshold for surgical consultation.  Grade 1 internal hemorrhoids. Repeat in 10 years.   COLONOSCOPY WITH PROPOFOL  N/A 12/01/2021   Procedure: COLONOSCOPY WITH PROPOFOL ;  Surgeon: Shaaron Lamar HERO, MD;  [facility]: [facility];  Service: Endoscopy;  Laterality: N/A;  12:45 pm   ESOPHAGOGASTRODUODENOSCOPY (EGD) WITH PROPOFOL  N/A 03/04/2019   Surgeon: Shaaron Lamar HERO, MD;  gastric mucosal changes found to be chronic gastritis and focal intestinal metaplasia negative for H. 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   ESOPHAGOGASTRODUODENOSCOPY (EGD) WITH PROPOFOL  N/A 12/01/2021   Procedure: ESOPHAGOGASTRODUODENOSCOPY (EGD) WITH PROPOFOL ;  Surgeon: Shaaron Lamar HERO, MD;  [facility]: [facility];  Service: Endoscopy;  Laterality: N/A;   LAPAROSCOPIC PARTIAL COLECTOMY N/A 07/14/2019   Procedure: LAPAROSCOPIC  LEFT HEMICOLECTOMY;  Surgeon: Kallie Manuelita BROCKS, MD;  Location: [facility];  Service: General;  Laterality: N/A;    Home Medications:  Allergies as of 10/09/2023       Reactions   Other    States he's allergic to a blood pressure pill that causes abdominal pain. He's not sure name of medication        Medication List        Accurate as of October 08, 2023  8:25 PM. If you have any questions, ask your nurse or doctor.          amLODipine  10 MG tablet Commonly known as: NORVASC  Take 1 tablet (10 mg total) by mouth daily. For Blood Pressure   aspirin  EC 81 MG tablet Take 1 tablet (81 mg total) by mouth daily. Swallow whole.   chlorthalidone  25 MG tablet Commonly known as: HYGROTON  Take 0.5 tablets (12.5 mg total) by mouth daily.   docusate sodium  100 MG capsule Commonly known as: COLACE Take 100 mg by mouth daily as needed for mild constipation.   DULoxetine 60 MG capsule Commonly known as: CYMBALTA Take 60 mg by mouth daily.   Entresto  24-26 MG Generic  drug: sacubitril -valsartan  TAKE 1 TABLET BY MOUTH TWICE DAILY   famotidine 20 MG tablet Commonly known as: PEPCID Take 20 mg by mouth 2 (two) times daily.   ferrous sulfate 325 (65 FE) MG tablet 1 tablet with meal Orally daily for 30 day(s)   hydrocortisone  2.5 % cream apply rectally TWICE DAILY   lidocaine  2 % solution Commonly known as: XYLOCAINE  place 15 mls ON back of THE TONGUE AND swallow EVERY 6 HOURS AS NEEDED FOR ABDOMINAL pain   metoprolol  succinate 100 MG 24 hr tablet Commonly known as: TOPROL -XL TAKE 1 TABLET BY MOUTH DAILY with OR immediately  following a meal   ondansetron  8 MG tablet Commonly known as: ZOFRAN  Take 8 mg by mouth every 8 (eight) hours as needed for vomiting or nausea.   pantoprazole  40 MG tablet Commonly known as: Protonix  Take 1 tablet (40 mg total) by mouth 2 (two) times daily before a meal.   polyethylene glycol 17 g packet Commonly known as: MIRALAX / GLYCOLAX Take 17 g by mouth daily.   QUEtiapine 25 MG tablet Commonly known as: SEROQUEL Take 25 mg by mouth at bedtime.   rosuvastatin  20 MG tablet Commonly known as: CRESTOR  TAKE 1 TABLET BY MOUTH EVERY DAY   spironolactone  25 MG tablet Commonly known as: ALDACTONE  Take 0.5 tablets (12.5 mg total) by mouth daily.   sucralfate  1 g tablet Commonly known as: CARAFATE  TAKE 1 TABLET BY MOUTH THREE TIMES DAILY WITH MEALS AND AT BEDTIME (4 a DAY)   Vitamin D (Ergocalciferol) 50000 units Caps Take 1 capsule by mouth once a week.        Allergies:  Allergies  Allergen Reactions   Other     States he's allergic to a blood pressure pill that causes abdominal pain. He's not sure name of medication    Family History  Problem Relation Age of Onset   Lung cancer Father    Colon cancer Neg Hx    Colon polyps Neg Hx     Social History:  reports that he has been smoking cigarettes. He has a 6.3 pack-year smoking history. He has never used smokeless tobacco. He reports that he does not drink alcohol and does not use drugs.  ROS: A complete review of systems was performed.  All systems are negative except for pertinent findings as noted.   I have reviewed prior pt notes  I have reviewed urinalysis results  I have reviewed prior PSA and pathology results  I have reviewed ultrasound of the prostate   Impression/Assessment:  Grade group 2 prostate cancer, new diagnosis  Nomogram predictions: -OCD-60% -Lymph nodes/seminal vesicle involvement-5% each -Progression free probability after radical prostatectomy at 5/10 years--78/65%  respectively  Plan:

## 2023-10-09 ENCOUNTER — Ambulatory Visit: Payer: MEDICAID | Admitting: Urology

## 2023-10-09 DIAGNOSIS — R350 Frequency of micturition: Secondary | ICD-10-CM

## 2023-10-09 DIAGNOSIS — R351 Nocturia: Secondary | ICD-10-CM

## 2023-10-09 DIAGNOSIS — C61 Malignant neoplasm of prostate: Secondary | ICD-10-CM

## 2023-11-28 ENCOUNTER — Ambulatory Visit: Payer: MEDICAID | Attending: Cardiology | Admitting: Cardiology

## 2023-11-28 NOTE — Progress Notes (Deleted)
 Clinical Summary Mr. Moch is a 53 y.o.male seen today for follow up of the following medical problems.        1. Presumed CAD - 08/2019 nuclear stress: large iferior infarct, no ischemia - 07/2019 echo inferior wall is akinetic.  - 08/2021 echo LVEF 40-45%, grade II dd. The inferior wall is akinetic. The basal inferolateral segment and apical inferior segment are hypokinetic    -at prior visit reported episode of chest pain wile  out mowing grass using push mower. Midchest, sharp 9/10 pain. Some nausea, some SOB. Stopped and rested inside house. Ongoing pain, lasted 20 minutes. Pain resolved on its own. Pain comes and goes since that time, typically with activity. Ongoing DOE that has progressed over the last few months     - unable to run on treadmill due SOB, chronic back pain, balance problems, dizziness   - still with chest pains at times. Episodes occur about once a week. - some decrease in frequency and severity since last visit - his insurance company denies a stress test 3 different times.        2. Dizziness - was at home sitting watching TV. Stood up and felt very dizzy, lightheaded and fell - reports drinks at least 4-6 bottles of water  daily.            3. Chronic systolic HF - 07/2019 echo LVEF 40-45%, inferior wall akinetic, basal inferolateral and apical inferior are hypokinetic.   08/2021 echo LVEF 40-45%, grade II dd. The inferior wall is akinetic. The basal inferolateral segment and apical inferior segment are hypokinetic - 08/2019 nuclear stress: large iferior infarct, no ischemia  - presumed ICM based on WMA, nuclear findings   - due to Cr we lowered entresto  to 24/26mg  bid on 01/02/22      Jan 2024 echo LVEF 40-45%, grade I dd  - he had started jardiance , reports caused constipation - ongoing SOB/DOE, no recent edema - complian twith meds   4. Palpitations/SVT - ER visit yesterday with palpitations, nausea, severe SOB - from notes EMS found  him to be in SVT, given rounds of adenosine without improvement - ER notes mention EKG with SVT rate 200 - developed some hypotension, required cardioversion -after cardioversion discussions for transfer to [facility] main campus but patient signed out AMA  - Free T4 0.79 TSH 1.18 Mg 1.6 K 3.7 Cr 1.39 Hstrop 131-->279     - coreg  caused SOB and fatigue, toprol  much better tolerated.  - some palpitations at times, overall not bothersome       5. HTN - compliant with meds   6. History of diverticulitis - hemicolectomy 07/2019     7. Hyperlipidemia - changed to crestor  5mg  daily Jan 2023 - side effects on lipitor, stomach discomfort - taking crestor  10mg  daily, reports recently increased by pcp   01/2021 TC 187 TG 50 HDL 68 LDL 109   - 01/02/22 crestor  increased to 20mg  daily -12/2021 TC 172 TG 21 HDL 77 LDL 91   - upcoming labs Thursday with pcp     6. Preoperative evaluation - considering prostate biopsy  Past Medical History:  Diagnosis Date   Arthritis    CAD (coronary artery disease)    Chronic systolic heart failure (HCC)    Diverticulitis    s/p partial colectomy due to stenotic sigmoind colon   Dyspnea    Dysrhythmia    GERD (gastroesophageal reflux disease)    HTN (hypertension)  Myocardial infarction [facility]) 2021   Panic attacks    SVT (supraventricular tachycardia)      Allergies  Allergen Reactions   Other     States he's allergic to a blood pressure pill that causes abdominal pain. He's not sure name of medication     Current Outpatient Medications  Medication Sig Dispense Refill   amLODipine  (NORVASC ) 10 MG tablet Take 1 tablet (10 mg total) by mouth daily. For Blood Pressure 30 tablet 5   aspirin  EC 81 MG tablet Take 1 tablet (81 mg total) by mouth daily. Swallow whole. 90 tablet 3   chlorthalidone  (HYGROTON ) 25 MG tablet Take 0.5 tablets (12.5 mg total) by mouth daily. 15 tablet 3   docusate sodium  (COLACE) 100 MG capsule Take 100 mg by mouth  daily as needed for mild constipation.     DULoxetine (CYMBALTA) 60 MG capsule Take 60 mg by mouth daily.     famotidine (PEPCID) 20 MG tablet Take 20 mg by mouth 2 (two) times daily. (Patient not taking: Reported on 02/28/2023)     ferrous sulfate 325 (65 FE) MG tablet 1 tablet with meal Orally daily for 30 day(s)     hydrocortisone  2.5 % cream apply rectally TWICE DAILY 30 g 1   lidocaine  (XYLOCAINE ) 2 % solution place 15 mls ON back of THE TONGUE AND swallow EVERY 6 HOURS AS NEEDED FOR ABDOMINAL pain 300 mL 1   metoprolol  succinate (TOPROL -XL) 100 MG 24 hr tablet TAKE 1 TABLET BY MOUTH DAILY with OR immediately following a meal 30 tablet 6   ondansetron  (ZOFRAN ) 8 MG tablet Take 8 mg by mouth every 8 (eight) hours as needed for vomiting or nausea.     pantoprazole  (PROTONIX ) 40 MG tablet Take 1 tablet (40 mg total) by mouth 2 (two) times daily before a meal. 60 tablet 3   polyethylene glycol (MIRALAX / GLYCOLAX) 17 g packet Take 17 g by mouth daily.     QUEtiapine (SEROQUEL) 25 MG tablet Take 25 mg by mouth at bedtime.     rosuvastatin  (CRESTOR ) 20 MG tablet TAKE 1 TABLET BY MOUTH EVERY DAY 30 tablet 3   sacubitril -valsartan  (ENTRESTO ) 24-26 MG TAKE 1 TABLET BY MOUTH TWICE DAILY 60 tablet 2   spironolactone  (ALDACTONE ) 25 MG tablet Take 0.5 tablets (12.5 mg total) by mouth daily.     sucralfate  (CARAFATE ) 1 g tablet TAKE 1 TABLET BY MOUTH THREE TIMES DAILY WITH MEALS AND AT BEDTIME (4 a DAY) 120 tablet 3   Vitamin D, Ergocalciferol, 50000 units CAPS Take 1 capsule by mouth once a week.     No current facility-administered medications for this visit.     Past Surgical History:  Procedure Laterality Date   BIOPSY  03/04/2019   Procedure: BIOPSY;  Surgeon: Shaaron Lamar HERO, MD;  [facility];  Service: Endoscopy;;  gastric   BIOPSY  12/01/2021   Procedure: BIOPSY;  Surgeon: Shaaron Lamar HERO, MD;  [facility]: [facility];  Service: Endoscopy;;   COLONOSCOPY     COLONOSCOPY WITH  PROPOFOL  N/A 03/04/2019   Surgeon: Shaaron Lamar HERO, MD;  Diverticulosis in the entire colon with a stenotic, stiff, chronically inflamed sigmoid segment.  Low threshold for surgical consultation.  Grade 1 internal hemorrhoids. Repeat in 10 years.   COLONOSCOPY WITH PROPOFOL  N/A 12/01/2021   Procedure: COLONOSCOPY WITH PROPOFOL ;  Surgeon: Shaaron Lamar HERO, MD;  [facility];  Service: Endoscopy;  Laterality: N/A;  12:45 pm   ESOPHAGOGASTRODUODENOSCOPY (  EGD) WITH PROPOFOL  N/A 03/04/2019   Surgeon: Shaaron Lamar HERO, MD;  gastric mucosal changes found to be chronic gastritis and focal intestinal metaplasia negative for H. 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   ESOPHAGOGASTRODUODENOSCOPY (EGD) WITH PROPOFOL  N/A 12/01/2021   Procedure: ESOPHAGOGASTRODUODENOSCOPY (EGD) WITH PROPOFOL ;  Surgeon: Shaaron Lamar HERO, MD;  [facility];  Service: Endoscopy;  Laterality: N/A;   LAPAROSCOPIC PARTIAL COLECTOMY N/A 07/14/2019   Procedure: LAPAROSCOPIC  LEFT HEMICOLECTOMY;  Surgeon: Kallie Manuelita BROCKS, MD;  Location: [facility];  Service: General;  Laterality: N/A;     Allergies  Allergen Reactions   Other     States he's allergic to a blood pressure pill that causes abdominal pain. He's not sure name of medication      Family History  Problem Relation Age of Onset   Lung cancer Father    Colon cancer Neg Hx    Colon polyps Neg Hx      Social History Mr. Mathieson reports that he has been smoking cigarettes. He has a 6.3 pack-year smoking history. He has never used smokeless tobacco. Mr. Walthall reports no history of alcohol use.   Review of Systems CONSTITUTIONAL: No weight loss, fever, chills, weakness or fatigue.  HEENT: Eyes: No visual loss, blurred vision, double vision or yellow sclerae.No hearing loss, sneezing, congestion, runny nose or sore throat.  SKIN: No rash or itching.  CARDIOVASCULAR:  RESPIRATORY: No  shortness of breath, cough or sputum.  GASTROINTESTINAL: No anorexia, nausea, vomiting or diarrhea. No abdominal pain or blood.  GENITOURINARY: No burning on urination, no polyuria NEUROLOGICAL: No headache, dizziness, syncope, paralysis, ataxia, numbness or tingling in the extremities. No change in bowel or bladder control.  MUSCULOSKELETAL: No muscle, back pain, joint pain or stiffness.  LYMPHATICS: No enlarged nodes. No history of splenectomy.  PSYCHIATRIC: No history of depression or anxiety.  ENDOCRINOLOGIC: No reports of sweating, cold or heat intolerance. No polyuria or polydipsia.  SABRA   Physical Examination There were no vitals filed for this visit. There were no vitals filed for this visit.  Gen: resting comfortably, no acute distress HEENT: no scleral icterus, pupils equal round and reactive, no palptable cervical adenopathy,  CV Resp: Clear to auscultation bilaterally GI: abdomen is soft, non-tender, non-distended, normal bowel sounds, no hepatosplenomegaly MSK: extremities are warm, no edema.  Skin: warm, no rash Neuro:  no focal deficits Psych: appropriate affect   Diagnostic Studies  Jan 2024 echo IMPRESSIONS     1. Left ventricular ejection fraction, by estimation, is 40 to 45%. The  left ventricle has mildly decreased function. The left ventricle  demonstrates regional wall motion abnormalities (see scoring  diagram/findings for description). The left ventricular   internal cavity size was mildly to moderately dilated. There is mild left  ventricular hypertrophy. Left ventricular diastolic parameters are  consistent with Grade I diastolic dysfunction (impaired relaxation). The  average left ventricular global  longitudinal strain is -10.7 %. The global longitudinal strain is  abnormal.   2. Right ventricular systolic function is mildly reduced. The right  ventricular size is normal. Tricuspid regurgitation signal is inadequate  for assessing PA pressure.    3. Left atrial size was mildly dilated.   4. The mitral valve is grossly normal. No evidence of mitral valve  regurgitation. No evidence of mitral stenosis.   5. The aortic valve is tricuspid. Aortic valve regurgitation is not  visualized. No aortic stenosis is present.  6. Aortic dilatation noted. There is mild dilatation of the aortic root,  measuring 40 mm.   7. The inferior vena cava is dilated in size with >50% respiratory  variability, suggesting right atrial pressure of 8 mmHg.    Assessment and Plan   1.Chest pain - insurance company has consistently denies stress testing despite his symptoms - since last visit some decrease in frequency, severity but not resolved - we have discussed cath given prior echo findings and symptoms. With recent hemorroidal bleeding and upcoming banding as well as prostate biopsy would hold at this time.  - conitnue current meds   2. Chronic HFrEF - continue current meds, he is euvolemic today - not on SGLT2i, reported caused constipation     3. Dizziness - recent orthostatic dizzines - orthostatic vitals today are normal however - try lowering aldactone  and following symptoms     4. HLD - upcoming labs with pcp   5.Preoperative evaluation - considering prostate biopsy - low risk neccesary procedure, recommend proceeding from cardiac standpoint. Can hold aspirin  if needed.    6. PSVT - continue current meds, overall controlled - EKG today shows NSR     Dorn PHEBE Ross, M.D., F.A.C.C.

## 2024-01-18 ENCOUNTER — Other Ambulatory Visit: Payer: Self-pay | Admitting: Cardiology

## 2024-02-04 ENCOUNTER — Other Ambulatory Visit: Payer: Self-pay | Admitting: Gastroenterology

## 2024-02-04 ENCOUNTER — Other Ambulatory Visit: Payer: Self-pay | Admitting: Cardiology

## 2024-02-04 DIAGNOSIS — R1013 Epigastric pain: Secondary | ICD-10-CM

## 2024-02-24 NOTE — Progress Notes (Unsigned)
 "   Impression/Assessment:  -Grade group 2 prostate cancer, thus far has had no treatment  -Lower urinary tract symptoms, may be OAB.  He does empty out well   Plan: -At this point, I would like to see if we can send his biopsy tissue out for decipher.  This might be an issue due to the coverage he has right now.  This might be changing to Medicare eventually, so if it gets denied we will consider resubmitting it later  - His PSA is checked today  - I think it is worthwhile to set up an appointment for him to see Dr. Dannielle at [facility] to consider external beam radiotherapy   History of Present Illness:   4.15.2025: He underwent ultrasound and biopsy of his prostate.  At that time, PSA was 8.7, prostate volume 27 mL, PSAD 0.32.  6/12 cores revealed adenocarcinoma: -4 cores revealed GS 3+3 pattern (right mid lateral, 5% of core involved; left base medial; 30% of core involved; left base lateral, 90% of core involved; left mid lateral, 60% of core involved) -2 cores revealed GS 3+4 pattern (left mid medial, 40% of core involved; left apex lateral, 30% of core involved)  1.20.2026: Here today for his first visit since June.  He was not felt to be adequate surgical risk for prostatectomy, has not had any curative therapy yet.  He does have lower urinary tract symptoms--IPSS 27/2.  Typically has a good stream.  Urgency and frequency are his biggest issues.  Is following up with Dr. Alvan in the near future.  Not on any medicines for his prostate/bladder.  Past Medical History:  Diagnosis Date   Arthritis    CAD (coronary artery disease)    Chronic systolic heart failure (HCC)    Diverticulitis    s/p partial colectomy due to stenotic sigmoind colon   Dyspnea    Dysrhythmia    GERD (gastroesophageal reflux disease)    HTN (hypertension)    Myocardial infarction (HCC) 2021   Panic attacks    SVT (supraventricular tachycardia)     Past Surgical History:  Procedure Laterality  Date   BIOPSY  03/04/2019   Procedure: BIOPSY;  Surgeon: Shaaron Lamar HERO, MD;  [facility];  Service: Endoscopy;;  gastric   BIOPSY  12/01/2021   Procedure: BIOPSY;  Surgeon: Shaaron Lamar HERO, MD;  [facility];  Service: Endoscopy;;   COLONOSCOPY     COLONOSCOPY WITH PROPOFOL  N/A 03/04/2019   Surgeon: Shaaron Lamar HERO, MD;  Diverticulosis in the entire colon with a stenotic, stiff, chronically inflamed sigmoid segment.  Low threshold for surgical consultation.  Grade 1 internal hemorrhoids. Repeat in 10 years.   COLONOSCOPY WITH PROPOFOL  N/A 12/01/2021   Procedure: COLONOSCOPY WITH PROPOFOL ;  Surgeon: Shaaron Lamar HERO, MD;  [facility];  Service: Endoscopy;  Laterality: N/A;  12:45 pm   ESOPHAGOGASTRODUODENOSCOPY (EGD) WITH PROPOFOL  N/A 03/04/2019   Surgeon: Shaaron Lamar HERO, MD;  gastric mucosal changes found to be chronic gastritis and focal intestinal metaplasia negative for H. 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   ESOPHAGOGASTRODUODENOSCOPY (EGD) WITH PROPOFOL  N/A 12/01/2021   Procedure: ESOPHAGOGASTRODUODENOSCOPY (EGD) WITH PROPOFOL ;  Surgeon: Shaaron Lamar HERO, MD;  [facility];  Service: Endoscopy;  Laterality: N/A;   LAPAROSCOPIC PARTIAL COLECTOMY N/A 07/14/2019   Procedure: LAPAROSCOPIC  LEFT HEMICOLECTOMY;  Surgeon: Kallie Manuelita BROCKS, MD;  Location: [facility];  Service: General;  Laterality:  N/A;    Home Medications:  Allergies as of 02/26/2024       Reactions   Other    States he's allergic to a blood pressure pill that causes abdominal pain. He's not sure name of medication        Medication List        Accurate as of February 24, 2024  8:50 AM. If you have any questions, ask your nurse or doctor.          amLODipine  10 MG tablet Commonly known as: NORVASC  Take 1 tablet (10 mg total) by mouth daily. For Blood Pressure   aspirin  EC 81 MG tablet Take 1 tablet (81  mg total) by mouth daily. Swallow whole.   chlorthalidone  25 MG tablet Commonly known as: HYGROTON  Take 0.5 tablets (12.5 mg total) by mouth daily.   docusate sodium  100 MG capsule Commonly known as: COLACE Take 100 mg by mouth daily as needed for mild constipation.   DULoxetine 60 MG capsule Commonly known as: CYMBALTA Take 60 mg by mouth daily.   Entresto  24-26 MG Generic drug: sacubitril -valsartan  TAKE 1 TABLET BY MOUTH TWICE DAILY   famotidine 20 MG tablet Commonly known as: PEPCID Take 20 mg by mouth 2 (two) times daily.   ferrous sulfate 325 (65 FE) MG tablet 1 tablet with meal Orally daily for 30 day(s)   hydrocortisone  2.5 % cream apply rectally TWICE DAILY   lidocaine  2 % solution Commonly known as: XYLOCAINE  place 15 mls ON back of THE TONGUE AND swallow EVERY 6 HOURS AS NEEDED FOR ABDOMINAL pain   metoprolol  succinate 100 MG 24 hr tablet Commonly known as: TOPROL -XL TAKE 1 TABLET BY MOUTH DAILY with OR immediately following a meal   ondansetron  8 MG tablet Commonly known as: ZOFRAN  Take 8 mg by mouth every 8 (eight) hours as needed for vomiting or nausea.   pantoprazole  40 MG tablet Commonly known as: Protonix  Take 1 tablet (40 mg total) by mouth 2 (two) times daily before a meal.   polyethylene glycol 17 g packet Commonly known as: MIRALAX / GLYCOLAX Take 17 g by mouth daily.   QUEtiapine 25 MG tablet Commonly known as: SEROQUEL Take 25 mg by mouth at bedtime.   rosuvastatin  20 MG tablet Commonly known as: CRESTOR  TAKE 1 TABLET BY MOUTH EVERY DAY   spironolactone  25 MG tablet Commonly known as: ALDACTONE  Take 0.5 tablets (12.5 mg total) by mouth daily.   sucralfate  1 g tablet Commonly known as: CARAFATE  TAKE 1 TABLET BY MOUTH THREE TIMES DAILY WITH MEALS AND AT BEDTIME (4 a DAY)   Vitamin D (Ergocalciferol) 50000 units Caps Take 1 capsule by mouth once a week.        Allergies:  Allergies  Allergen Reactions   Other     States  he's allergic to a blood pressure pill that causes abdominal pain. He's not sure name of medication    Family History  Problem Relation Age of Onset   Lung cancer Father    Colon cancer Neg Hx    Colon polyps Neg Hx     Social History:  reports that he has been smoking cigarettes. He has a 6.3 pack-year smoking history. He has never used smokeless tobacco. He reports that he does not drink alcohol and does not use drugs.  ROS: A complete review of systems was performed.  All systems are negative except for pertinent findings as noted.   I have reviewed prior pt notes  I  have reviewed urinalysis results--clear  I have reviewed prior PSA and pathology results  I have reviewed ultrasound of the prostate  Bladder scan today revealed 0 residual  "

## 2024-02-26 ENCOUNTER — Ambulatory Visit: Payer: MEDICAID | Admitting: Urology

## 2024-02-26 VITALS — BP 123/79 | HR 66

## 2024-02-26 DIAGNOSIS — C61 Malignant neoplasm of prostate: Secondary | ICD-10-CM

## 2024-02-26 DIAGNOSIS — R350 Frequency of micturition: Secondary | ICD-10-CM | POA: Diagnosis not present

## 2024-02-26 DIAGNOSIS — R3915 Urgency of urination: Secondary | ICD-10-CM

## 2024-02-26 LAB — URINALYSIS, ROUTINE W REFLEX MICROSCOPIC
Bilirubin, UA: NEGATIVE
Glucose, UA: NEGATIVE
Ketones, UA: NEGATIVE
Leukocytes,UA: NEGATIVE
Nitrite, UA: NEGATIVE
Protein,UA: NEGATIVE
RBC, UA: NEGATIVE
Specific Gravity, UA: 1.015 (ref 1.005–1.030)
Urobilinogen, Ur: 0.2 mg/dL (ref 0.2–1.0)
pH, UA: 7.5 (ref 5.0–7.5)

## 2024-02-27 ENCOUNTER — Ambulatory Visit: Payer: Self-pay | Admitting: Urology

## 2024-02-27 LAB — PSA: Prostate Specific Ag, Serum: 9.5 ng/mL — ABNORMAL HIGH (ref 0.0–4.0)

## 2024-02-28 ENCOUNTER — Telehealth: Payer: Self-pay | Admitting: *Deleted

## 2024-02-28 DIAGNOSIS — R1013 Epigastric pain: Secondary | ICD-10-CM

## 2024-02-28 MED ORDER — SUCRALFATE 1 G PO TABS: 1.0000 g | ORAL_TABLET | Freq: Three times a day (TID) | ORAL | 0 refills | Status: AC

## 2024-02-28 NOTE — Addendum Note (Signed)
 Addended by: EZZARD SONNY RAMAN on: 02/28/2024 02:58 PM   Modules accepted: Orders

## 2024-02-28 NOTE — Telephone Encounter (Signed)
 Refill request for sucralfate  1g tablet. Last OV 02/28/2023

## 2024-02-29 ENCOUNTER — Telehealth: Payer: Self-pay

## 2024-02-29 NOTE — Telephone Encounter (Signed)
 Anthony Vaughn with [facility] left a voice message 02-29-2024.  Received a referral.  An appointment has been made for March 06, 2024 at 10am.

## 2024-02-29 NOTE — Telephone Encounter (Signed)
 FYI

## 2024-03-18 ENCOUNTER — Ambulatory Visit: Payer: MEDICAID | Admitting: Gastroenterology

## 2024-05-14 ENCOUNTER — Ambulatory Visit: Payer: MEDICAID | Admitting: Cardiology
